# Patient Record
Sex: Male | Born: 1975 | Race: White | Hispanic: No | Marital: Married | Smoking: Never smoker
Health system: Southern US, Community
[De-identification: ages and names within clinical notes are randomized; demographics above are authoritative.]

## PROBLEM LIST (undated history)

## (undated) DIAGNOSIS — G4730 Sleep apnea, unspecified: Secondary | ICD-10-CM

## (undated) DIAGNOSIS — M722 Plantar fascial fibromatosis: Secondary | ICD-10-CM

## (undated) DIAGNOSIS — I10 Essential (primary) hypertension: Secondary | ICD-10-CM

## (undated) DIAGNOSIS — F329 Major depressive disorder, single episode, unspecified: Secondary | ICD-10-CM

## (undated) DIAGNOSIS — F419 Anxiety disorder, unspecified: Secondary | ICD-10-CM

## (undated) DIAGNOSIS — R110 Nausea: Secondary | ICD-10-CM

## (undated) DIAGNOSIS — M545 Low back pain, unspecified: Secondary | ICD-10-CM

## (undated) DIAGNOSIS — E119 Type 2 diabetes mellitus without complications: Secondary | ICD-10-CM

## (undated) DIAGNOSIS — N400 Enlarged prostate without lower urinary tract symptoms: Secondary | ICD-10-CM

## (undated) DIAGNOSIS — N3281 Overactive bladder: Secondary | ICD-10-CM

## (undated) DIAGNOSIS — R509 Fever, unspecified: Secondary | ICD-10-CM

## (undated) DIAGNOSIS — N201 Calculus of ureter: Secondary | ICD-10-CM

## (undated) DIAGNOSIS — F32A Depression, unspecified: Secondary | ICD-10-CM

## (undated) DIAGNOSIS — K602 Anal fissure, unspecified: Secondary | ICD-10-CM

## (undated) HISTORY — DX: Overactive bladder: N32.81

## (undated) HISTORY — DX: Low back pain: M54.5

## (undated) HISTORY — DX: Plantar fascial fibromatosis: M72.2

## (undated) HISTORY — DX: Depression, unspecified: F32.A

## (undated) HISTORY — DX: Anal fissure, unspecified: K60.2

## (undated) HISTORY — DX: Fever, unspecified: R50.9

## (undated) HISTORY — DX: Calculus of ureter: N20.1

## (undated) HISTORY — DX: Low back pain, unspecified: M54.50

## (undated) HISTORY — PX: ANAL SPHINCTEROTOMY: SHX1140

## (undated) HISTORY — DX: Morbid (severe) obesity due to excess calories: E66.01

## (undated) HISTORY — PX: ESOPHAGOGASTRODUODENOSCOPY: SHX1529

## (undated) HISTORY — DX: Nausea: R11.0

## (undated) HISTORY — DX: Major depressive disorder, single episode, unspecified: F32.9

## (undated) HISTORY — PX: APPENDECTOMY: SHX54

## (undated) HISTORY — DX: Anxiety disorder, unspecified: F41.9

## (undated) HISTORY — DX: Sleep apnea, unspecified: G47.30

---

## 2005-11-27 ENCOUNTER — Ambulatory Visit (HOSPITAL_COMMUNITY): Admission: RE | Admit: 2005-11-27 | Discharge: 2005-11-27 | Payer: Self-pay | Admitting: Surgery

## 2006-05-21 ENCOUNTER — Emergency Department (HOSPITAL_COMMUNITY): Admission: EM | Admit: 2006-05-21 | Discharge: 2006-05-21 | Payer: Self-pay | Admitting: Emergency Medicine

## 2006-05-24 ENCOUNTER — Emergency Department (HOSPITAL_COMMUNITY): Admission: EM | Admit: 2006-05-24 | Discharge: 2006-05-24 | Payer: Self-pay | Admitting: Emergency Medicine

## 2006-05-28 ENCOUNTER — Encounter (HOSPITAL_COMMUNITY): Admission: RE | Admit: 2006-05-28 | Discharge: 2006-07-17 | Payer: Self-pay | Admitting: Emergency Medicine

## 2007-01-01 ENCOUNTER — Ambulatory Visit (HOSPITAL_BASED_OUTPATIENT_CLINIC_OR_DEPARTMENT_OTHER): Admission: RE | Admit: 2007-01-01 | Discharge: 2007-01-01 | Payer: Self-pay | Admitting: Otolaryngology

## 2007-01-04 ENCOUNTER — Ambulatory Visit: Payer: Self-pay | Admitting: Internal Medicine

## 2007-01-25 ENCOUNTER — Ambulatory Visit: Payer: Self-pay | Admitting: Internal Medicine

## 2007-06-24 ENCOUNTER — Emergency Department (HOSPITAL_COMMUNITY): Admission: EM | Admit: 2007-06-24 | Discharge: 2007-06-24 | Payer: Self-pay | Admitting: Family Medicine

## 2007-06-30 ENCOUNTER — Emergency Department (HOSPITAL_COMMUNITY): Admission: EM | Admit: 2007-06-30 | Discharge: 2007-06-30 | Payer: Self-pay | Admitting: Emergency Medicine

## 2007-08-03 ENCOUNTER — Ambulatory Visit: Payer: Self-pay | Admitting: Licensed Clinical Social Worker

## 2007-08-03 ENCOUNTER — Ambulatory Visit: Payer: Self-pay | Admitting: Family Medicine

## 2007-08-03 DIAGNOSIS — M545 Low back pain, unspecified: Secondary | ICD-10-CM | POA: Insufficient documentation

## 2007-08-03 DIAGNOSIS — G4730 Sleep apnea, unspecified: Secondary | ICD-10-CM | POA: Insufficient documentation

## 2007-08-03 DIAGNOSIS — F411 Generalized anxiety disorder: Secondary | ICD-10-CM | POA: Insufficient documentation

## 2007-08-03 DIAGNOSIS — F329 Major depressive disorder, single episode, unspecified: Secondary | ICD-10-CM

## 2007-08-03 DIAGNOSIS — F3289 Other specified depressive episodes: Secondary | ICD-10-CM | POA: Insufficient documentation

## 2007-08-04 ENCOUNTER — Encounter: Payer: Self-pay | Admitting: Family Medicine

## 2007-08-04 LAB — CONVERTED CEMR LAB
ALT: 33 units/L (ref 0–53)
AST: 26 units/L (ref 0–37)
Albumin: 3.8 g/dL (ref 3.5–5.2)
Alkaline Phosphatase: 51 units/L (ref 39–117)
BUN: 16 mg/dL (ref 6–23)
Basophils Absolute: 0 10*3/uL (ref 0.0–0.1)
Basophils Relative: 0.3 % (ref 0.0–1.0)
Bilirubin, Direct: 0.2 mg/dL (ref 0.0–0.3)
CO2: 27 meq/L (ref 19–32)
Calcium: 9.5 mg/dL (ref 8.4–10.5)
Chloride: 106 meq/L (ref 96–112)
Cholesterol: 187 mg/dL (ref 0–200)
Creatinine, Ser: 0.8 mg/dL (ref 0.4–1.5)
Eosinophils Absolute: 0.1 10*3/uL (ref 0.0–0.6)
Eosinophils Relative: 1.8 % (ref 0.0–5.0)
GFR calc Af Amer: 147 mL/min
GFR calc non Af Amer: 121 mL/min
Glucose, Bld: 84 mg/dL (ref 70–99)
HCT: 42.5 % (ref 39.0–52.0)
HDL: 37.6 mg/dL — ABNORMAL LOW (ref >39.0)
Hemoglobin: 15 g/dL (ref 13.0–17.0)
LDL Cholesterol: 129 mg/dL — ABNORMAL HIGH (ref 0–99)
Lymphocytes Relative: 32.4 % (ref 12.0–46.0)
MCHC: 35.3 g/dL (ref 30.0–36.0)
MCV: 84.2 fL (ref 78.0–100.0)
Monocytes Absolute: 0.4 10*3/uL (ref 0.2–0.7)
Monocytes Relative: 4.9 % (ref 3.0–11.0)
Neutro Abs: 4.6 10*3/uL (ref 1.4–7.7)
Neutrophils Relative %: 60.6 % (ref 43.0–77.0)
Platelets: 267 10*3/uL (ref 150–400)
Potassium: 5.1 meq/L (ref 3.5–5.1)
RBC: 5.05 M/uL (ref 4.22–5.81)
RDW: 13.3 % (ref 11.5–14.6)
Sodium: 141 meq/L (ref 135–145)
TSH: 1.83 microintl units/mL (ref 0.35–5.50)
Total Bilirubin: 0.6 mg/dL (ref 0.3–1.2)
Total CHOL/HDL Ratio: 5
Total Protein: 7.1 g/dL (ref 6.0–8.3)
Triglycerides: 100 mg/dL (ref 0–149)
VLDL: 20 mg/dL (ref 0–40)
WBC: 7.6 10*3/uL (ref 4.5–10.5)

## 2007-08-11 ENCOUNTER — Ambulatory Visit: Payer: Self-pay | Admitting: Licensed Clinical Social Worker

## 2007-09-16 ENCOUNTER — Telehealth: Payer: Self-pay | Admitting: Family Medicine

## 2007-09-23 ENCOUNTER — Telehealth: Payer: Self-pay | Admitting: Family Medicine

## 2007-10-08 ENCOUNTER — Encounter: Payer: Self-pay | Admitting: Endocrinology

## 2007-10-08 ENCOUNTER — Encounter: Payer: Self-pay | Admitting: Family Medicine

## 2007-10-14 ENCOUNTER — Ambulatory Visit (HOSPITAL_COMMUNITY): Admission: RE | Admit: 2007-10-14 | Discharge: 2007-10-14 | Payer: Self-pay | Admitting: General Surgery

## 2007-10-19 ENCOUNTER — Encounter: Admission: RE | Admit: 2007-10-19 | Discharge: 2007-10-20 | Payer: Self-pay | Admitting: General Surgery

## 2007-12-15 ENCOUNTER — Ambulatory Visit: Payer: Self-pay | Admitting: Family Medicine

## 2007-12-15 DIAGNOSIS — N318 Other neuromuscular dysfunction of bladder: Secondary | ICD-10-CM | POA: Insufficient documentation

## 2007-12-15 LAB — CONVERTED CEMR LAB
Bilirubin Urine: NEGATIVE
Blood in Urine, dipstick: NEGATIVE
Glucose, Urine, Semiquant: NEGATIVE
Ketones, urine, test strip: NEGATIVE
Nitrite: NEGATIVE
Protein, U semiquant: NEGATIVE
Specific Gravity, Urine: 1.015
Urobilinogen, UA: 0.2
WBC Urine, dipstick: NEGATIVE
pH: 7

## 2007-12-30 ENCOUNTER — Telehealth: Payer: Self-pay | Admitting: Family Medicine

## 2008-03-11 ENCOUNTER — Telehealth: Payer: Self-pay | Admitting: Family Medicine

## 2008-03-17 ENCOUNTER — Ambulatory Visit: Payer: Self-pay | Admitting: Family Medicine

## 2008-03-23 ENCOUNTER — Telehealth: Payer: Self-pay | Admitting: Family Medicine

## 2008-03-23 ENCOUNTER — Encounter: Admission: RE | Admit: 2008-03-23 | Discharge: 2008-03-23 | Payer: Self-pay | Admitting: Family Medicine

## 2008-03-23 ENCOUNTER — Encounter: Admission: RE | Admit: 2008-03-23 | Discharge: 2008-03-23 | Payer: Self-pay | Admitting: General Surgery

## 2008-03-31 ENCOUNTER — Telehealth: Payer: Self-pay | Admitting: Family Medicine

## 2008-04-11 ENCOUNTER — Telehealth: Payer: Self-pay | Admitting: Family Medicine

## 2008-04-12 ENCOUNTER — Ambulatory Visit: Payer: Self-pay | Admitting: Family Medicine

## 2008-04-19 ENCOUNTER — Encounter: Admission: RE | Admit: 2008-04-19 | Discharge: 2008-07-18 | Payer: Self-pay | Admitting: Family Medicine

## 2008-06-06 ENCOUNTER — Ambulatory Visit (HOSPITAL_COMMUNITY): Admission: RE | Admit: 2008-06-06 | Discharge: 2008-06-07 | Payer: Self-pay | Admitting: General Surgery

## 2008-06-06 HISTORY — PX: OTHER SURGICAL HISTORY: SHX169

## 2009-03-08 ENCOUNTER — Ambulatory Visit (HOSPITAL_COMMUNITY): Admission: RE | Admit: 2009-03-08 | Discharge: 2009-03-08 | Payer: Self-pay | Admitting: Urology

## 2009-05-15 ENCOUNTER — Ambulatory Visit: Payer: Self-pay | Admitting: Family Medicine

## 2009-05-15 DIAGNOSIS — K645 Perianal venous thrombosis: Secondary | ICD-10-CM | POA: Insufficient documentation

## 2009-06-06 ENCOUNTER — Ambulatory Visit (HOSPITAL_COMMUNITY): Admission: RE | Admit: 2009-06-06 | Discharge: 2009-06-06 | Payer: Self-pay | Admitting: General Surgery

## 2009-06-29 ENCOUNTER — Telehealth: Payer: Self-pay | Admitting: Family Medicine

## 2009-07-05 ENCOUNTER — Emergency Department (HOSPITAL_COMMUNITY): Admission: EM | Admit: 2009-07-05 | Discharge: 2009-07-06 | Payer: Self-pay | Admitting: Emergency Medicine

## 2009-07-06 ENCOUNTER — Ambulatory Visit: Payer: Self-pay | Admitting: Psychiatry

## 2009-07-06 ENCOUNTER — Inpatient Hospital Stay (HOSPITAL_COMMUNITY): Admission: AD | Admit: 2009-07-06 | Discharge: 2009-07-09 | Payer: Self-pay | Admitting: Psychiatry

## 2009-07-12 ENCOUNTER — Telehealth: Payer: Self-pay | Admitting: Family Medicine

## 2009-07-16 ENCOUNTER — Emergency Department (HOSPITAL_COMMUNITY): Admission: EM | Admit: 2009-07-16 | Discharge: 2009-07-16 | Payer: Self-pay | Admitting: Emergency Medicine

## 2009-07-18 ENCOUNTER — Encounter: Payer: Self-pay | Admitting: Internal Medicine

## 2009-07-18 ENCOUNTER — Ambulatory Visit: Payer: Self-pay | Admitting: Gastroenterology

## 2009-07-19 ENCOUNTER — Ambulatory Visit: Payer: Self-pay | Admitting: Endocrinology

## 2009-07-19 DIAGNOSIS — R519 Headache, unspecified: Secondary | ICD-10-CM | POA: Insufficient documentation

## 2009-07-19 DIAGNOSIS — R51 Headache: Secondary | ICD-10-CM | POA: Insufficient documentation

## 2009-07-21 ENCOUNTER — Encounter: Payer: Self-pay | Admitting: Endocrinology

## 2009-07-25 ENCOUNTER — Ambulatory Visit: Payer: Self-pay | Admitting: Family Medicine

## 2009-07-25 DIAGNOSIS — K219 Gastro-esophageal reflux disease without esophagitis: Secondary | ICD-10-CM | POA: Insufficient documentation

## 2009-07-26 ENCOUNTER — Telehealth: Payer: Self-pay | Admitting: Family Medicine

## 2009-07-26 LAB — CONVERTED CEMR LAB
Catecholamines Tot(E+NE) 24 Hr U: 0.128 mg/24hr — ABNORMAL HIGH
Dopamine 24 Hr Urine: 585 mcg/24hr — ABNORMAL HIGH (ref <500)
Epinephrine 24 Hr Urine: 5 mcg/24hr (ref <20)
Metaneph Total, Ur: 1004 ug/24hr — ABNORMAL HIGH (ref 115–695)
Metanephrines, Ur: 109 (ref 36–190)
Norepinephrine 24 Hr Urine: 122 mcg/24hr — ABNORMAL HIGH (ref <80)
Normetanephrine, 24H Ur: 895 — ABNORMAL HIGH (ref 35–482)

## 2009-07-31 ENCOUNTER — Telehealth: Payer: Self-pay | Admitting: Internal Medicine

## 2009-07-31 ENCOUNTER — Ambulatory Visit: Payer: Self-pay | Admitting: Gastroenterology

## 2009-07-31 DIAGNOSIS — K602 Anal fissure, unspecified: Secondary | ICD-10-CM | POA: Insufficient documentation

## 2009-07-31 DIAGNOSIS — R112 Nausea with vomiting, unspecified: Secondary | ICD-10-CM | POA: Insufficient documentation

## 2009-07-31 DIAGNOSIS — R1310 Dysphagia, unspecified: Secondary | ICD-10-CM | POA: Insufficient documentation

## 2009-07-31 DIAGNOSIS — R1110 Vomiting, unspecified: Secondary | ICD-10-CM | POA: Insufficient documentation

## 2009-07-31 DIAGNOSIS — R197 Diarrhea, unspecified: Secondary | ICD-10-CM | POA: Insufficient documentation

## 2009-07-31 DIAGNOSIS — R1319 Other dysphagia: Secondary | ICD-10-CM | POA: Insufficient documentation

## 2009-07-31 DIAGNOSIS — K920 Hematemesis: Secondary | ICD-10-CM | POA: Insufficient documentation

## 2009-08-01 ENCOUNTER — Encounter: Payer: Self-pay | Admitting: Physician Assistant

## 2009-08-01 LAB — CONVERTED CEMR LAB
ALT: 37 units/L (ref 0–53)
AST: 30 units/L (ref 0–37)
Albumin: 3.4 g/dL — ABNORMAL LOW (ref 3.5–5.2)
Alkaline Phosphatase: 39 units/L (ref 39–117)
BUN: 13 mg/dL (ref 6–23)
Basophils Absolute: 0.1 10*3/uL (ref 0.0–0.1)
Basophils Relative: 1 % (ref 0.0–3.0)
CO2: 29 meq/L (ref 19–32)
Calcium: 8.9 mg/dL (ref 8.4–10.5)
Chloride: 108 meq/L (ref 96–112)
Creatinine, Ser: 0.8 mg/dL (ref 0.4–1.5)
Eosinophils Absolute: 0.3 10*3/uL (ref 0.0–0.7)
Eosinophils Relative: 4.8 % (ref 0.0–5.0)
GFR calc non Af Amer: 119.41 mL/min (ref >60)
Glucose, Bld: 95 mg/dL (ref 70–99)
HCT: 38.1 % — ABNORMAL LOW (ref 39.0–52.0)
Hemoglobin: 13.2 g/dL (ref 13.0–17.0)
Lymphocytes Relative: 45 % (ref 12.0–46.0)
Lymphs Abs: 3 10*3/uL (ref 0.7–4.0)
MCHC: 34.7 g/dL (ref 30.0–36.0)
MCV: 86.2 fL (ref 78.0–100.0)
Monocytes Absolute: 0.4 10*3/uL (ref 0.1–1.0)
Monocytes Relative: 5.6 % (ref 3.0–12.0)
Neutro Abs: 3 10*3/uL (ref 1.4–7.7)
Neutrophils Relative %: 43.6 % (ref 43.0–77.0)
Platelets: 225 10*3/uL (ref 150.0–400.0)
Potassium: 4.4 meq/L (ref 3.5–5.1)
RBC: 4.41 M/uL (ref 4.22–5.81)
RDW: 14.1 % (ref 11.5–14.6)
Sodium: 141 meq/L (ref 135–145)
Total Bilirubin: 0.7 mg/dL (ref 0.3–1.2)
Total Protein: 6.8 g/dL (ref 6.0–8.3)
WBC: 6.8 10*3/uL (ref 4.5–10.5)

## 2009-08-03 ENCOUNTER — Ambulatory Visit (HOSPITAL_COMMUNITY): Admission: RE | Admit: 2009-08-03 | Discharge: 2009-08-03 | Payer: Self-pay | Admitting: Gastroenterology

## 2009-08-08 ENCOUNTER — Ambulatory Visit: Payer: Self-pay | Admitting: Internal Medicine

## 2009-08-08 ENCOUNTER — Ambulatory Visit (HOSPITAL_COMMUNITY): Admission: RE | Admit: 2009-08-08 | Discharge: 2009-08-08 | Payer: Self-pay | Admitting: Internal Medicine

## 2009-10-17 ENCOUNTER — Ambulatory Visit: Payer: Self-pay | Admitting: Psychiatry

## 2009-10-17 ENCOUNTER — Inpatient Hospital Stay (HOSPITAL_COMMUNITY): Admission: EM | Admit: 2009-10-17 | Discharge: 2009-10-19 | Payer: Self-pay | Admitting: Psychiatry

## 2009-10-17 ENCOUNTER — Emergency Department (HOSPITAL_COMMUNITY): Admission: EM | Admit: 2009-10-17 | Discharge: 2009-10-17 | Payer: Self-pay | Admitting: Emergency Medicine

## 2009-11-17 ENCOUNTER — Ambulatory Visit (HOSPITAL_COMMUNITY): Admission: RE | Admit: 2009-11-17 | Discharge: 2009-11-17 | Payer: Self-pay | Admitting: General Surgery

## 2009-12-04 ENCOUNTER — Emergency Department (HOSPITAL_COMMUNITY): Admission: EM | Admit: 2009-12-04 | Discharge: 2009-12-04 | Payer: Self-pay | Admitting: Emergency Medicine

## 2009-12-07 ENCOUNTER — Telehealth: Payer: Self-pay | Admitting: Family Medicine

## 2009-12-14 ENCOUNTER — Telehealth: Payer: Self-pay | Admitting: Family Medicine

## 2010-04-09 ENCOUNTER — Telehealth: Payer: Self-pay | Admitting: Family Medicine

## 2010-05-15 ENCOUNTER — Ambulatory Visit: Payer: Self-pay | Admitting: Family Medicine

## 2010-05-15 DIAGNOSIS — R1011 Right upper quadrant pain: Secondary | ICD-10-CM | POA: Insufficient documentation

## 2010-05-15 LAB — CONVERTED CEMR LAB
Bilirubin Urine: NEGATIVE
Blood in Urine, dipstick: NEGATIVE
Glucose, Urine, Semiquant: NEGATIVE
Ketones, urine, test strip: NEGATIVE
Nitrite: NEGATIVE
Protein, U semiquant: NEGATIVE
Specific Gravity, Urine: 1.025
Urobilinogen, UA: 0.2
WBC Urine, dipstick: NEGATIVE
pH: 5.5

## 2010-05-17 ENCOUNTER — Encounter: Admission: RE | Admit: 2010-05-17 | Discharge: 2010-05-17 | Payer: Self-pay | Admitting: Family Medicine

## 2010-05-17 LAB — CONVERTED CEMR LAB
ALT: 25 units/L (ref 0–53)
AST: 27 units/L (ref 0–37)
Albumin: 3.9 g/dL (ref 3.5–5.2)
Alkaline Phosphatase: 50 units/L (ref 39–117)
Amylase: 67 units/L (ref 27–131)
BUN: 18 mg/dL (ref 6–23)
Basophils Absolute: 0 10*3/uL (ref 0.0–0.1)
Basophils Relative: 0.4 % (ref 0.0–3.0)
Bilirubin, Direct: 0.1 mg/dL (ref 0.0–0.3)
CO2: 25 meq/L (ref 19–32)
Calcium: 9.4 mg/dL (ref 8.4–10.5)
Chloride: 106 meq/L (ref 96–112)
Creatinine, Ser: 0.8 mg/dL (ref 0.4–1.5)
Eosinophils Absolute: 0.3 10*3/uL (ref 0.0–0.7)
Eosinophils Relative: 2.3 % (ref 0.0–5.0)
GFR calc non Af Amer: 117.12 mL/min (ref >60)
Glucose, Bld: 122 mg/dL — ABNORMAL HIGH (ref 70–99)
HCT: 40.3 % (ref 39.0–52.0)
Hemoglobin: 13.9 g/dL (ref 13.0–17.0)
Lipase: 9 units/L — ABNORMAL LOW (ref 11.0–59.0)
Lymphocytes Relative: 29 % (ref 12.0–46.0)
Lymphs Abs: 3.5 10*3/uL (ref 0.7–4.0)
MCHC: 34.5 g/dL (ref 30.0–36.0)
MCV: 85.8 fL (ref 78.0–100.0)
Monocytes Absolute: 0.5 10*3/uL (ref 0.1–1.0)
Monocytes Relative: 4.3 % (ref 3.0–12.0)
Neutro Abs: 7.6 10*3/uL (ref 1.4–7.7)
Neutrophils Relative %: 64 % (ref 43.0–77.0)
Platelets: 314 10*3/uL (ref 150.0–400.0)
Potassium: 4.3 meq/L (ref 3.5–5.1)
RBC: 4.71 M/uL (ref 4.22–5.81)
RDW: 14.1 % (ref 11.5–14.6)
Sodium: 140 meq/L (ref 135–145)
Total Bilirubin: 0.3 mg/dL (ref 0.3–1.2)
Total Protein: 7.9 g/dL (ref 6.0–8.3)
WBC: 11.9 10*3/uL — ABNORMAL HIGH (ref 4.5–10.5)

## 2010-07-02 ENCOUNTER — Telehealth: Payer: Self-pay | Admitting: Family Medicine

## 2010-07-18 ENCOUNTER — Emergency Department (HOSPITAL_COMMUNITY): Admission: EM | Admit: 2010-07-18 | Discharge: 2010-07-18 | Payer: Self-pay | Admitting: Family Medicine

## 2010-10-09 ENCOUNTER — Emergency Department (HOSPITAL_COMMUNITY)
Admission: EM | Admit: 2010-10-09 | Discharge: 2010-10-09 | Payer: Self-pay | Source: Home / Self Care | Admitting: Oncology

## 2010-11-21 ENCOUNTER — Ambulatory Visit (HOSPITAL_COMMUNITY)
Admission: RE | Admit: 2010-11-21 | Discharge: 2010-11-21 | Payer: Self-pay | Source: Home / Self Care | Attending: Gastroenterology | Admitting: Gastroenterology

## 2010-11-25 ENCOUNTER — Encounter: Payer: Self-pay | Admitting: Gastroenterology

## 2010-11-25 ENCOUNTER — Encounter: Payer: Self-pay | Admitting: General Surgery

## 2010-12-04 NOTE — Progress Notes (Signed)
Summary: refill hydrocodone  Phone Note Refill Request Message from:  Pharmacy on December 07, 2009 9:57 AM  Refills Requested: Medication #1:  VICODIN ES 7.5-750 MG  TABS one by mouth q 4-6 hrs as needed for pain. [facility]   Method Requested: Electronic Initial call taken by: Alfred Levins, CMA,  December 07, 2009 9:57 AM  Follow-up for Phone Call        call in #60 with 5 rf Follow-up by: Nelwyn Salisbury MD,  December 07, 2009 12:06 PM  Additional Follow-up for Phone Call Additional follow up Details #1::        Rx called to pharmacy Additional Follow-up by: Alfred Levins, CMA,  December 07, 2009 12:18 PM    Prescriptions: VICODIN ES 7.5-750 MG  TABS (HYDROCODONE-ACETAMINOPHEN) one by mouth q 4-6 hrs as needed for pain.  #60 x 2   Entered by:   Alfred Levins, CMA   Authorized by:   Nelwyn Salisbury MD   Signed by:   Alfred Levins, CMA on 12/07/2009   Method used:   Telephoned to ...       [facility]. [phone]* (retail)       [phone]       [place], [place]  [address]       Ph: [phone]       Fax: [phone]   RxID:   [id number]

## 2010-12-04 NOTE — Assessment & Plan Note (Signed)
Summary: headaches/dm   Vital Signs:  Patient profile:   35 year old male Weight:      430 pounds BMI:     53.94 Temp:     99.1 degrees F oral BP sitting:   120 / 80  (left arm) Cuff size:   large  Vitals Entered By: Raechel Ache, RN (May 15, 2010 4:11 PM) CC: C/o nausea, vomiting, diarrhea, upper abd pain , low-grade fever and headaches x several weeks.   History of Present Illness: For the past 10 days he has had intermittent RUQ adominal pains, nausea and vomitting, loose stools, and a low grade fever. Appetite is down a bit. The pains usually start 15-20 minutes after eating a meal, then they improve in between meals. he had an ivolved workup last fall per Dr. Leone Payor for nausea and diarrhea with normal labs and a normal EGD, and it was felt these symptoms were from the Abilify he was taking then. After that the Abilify was stopped, and he was switched to Geodon and lithium, and he has had no GI problems until 10 days ago. No urinary symptoms.   Allergies: No Known Drug Allergies  Past History:  Past Medical History: Anxiety Depression (per Dr. Marshall Cork WITH PSYCHOTIC FEATURES 9/10 /BIPOLAR Low back pain  sleep apnea (Dr. Jenne Pane) CPAP overactive bladder morbid obesity plantar fasciitis Anal Fissure HX URETEROLITHIASIS chronic nausea, normal workup per Dr. Leone Payor  Past Surgical History: Appendectomy 1998 sphincterotomy for anal fissure (Dr. Pricilla Holm Lap Band 06/2008 Dr. Johna Sheriff EGD on 08-08-09 per Dr. Leone Payor, normal  Review of Systems  The patient denies anorexia, weight loss, weight gain, vision loss, decreased hearing, hoarseness, chest pain, syncope, dyspnea on exertion, peripheral edema, prolonged cough, headaches, hemoptysis, melena, hematochezia, severe indigestion/heartburn, hematuria, incontinence, genital sores, muscle weakness, suspicious skin lesions, transient blindness, difficulty walking, depression, unusual weight change, abnormal bleeding,  enlarged lymph nodes, angioedema, breast masses, and testicular masses.    Physical Exam  General:  Well-developed,well-nourished,in no acute distress; alert,appropriate and cooperative throughout examination Neck:  No deformities, masses, or tenderness noted. Lungs:  Normal respiratory effort, chest expands symmetrically. Lungs are clear to auscultation, no crackles or wheezes. Heart:  Normal rate and regular rhythm. S1 and S2 normal without gallop, murmur, click, rub or other extra sounds. Abdomen:  soft, normal bowel sounds, no distention, no masses, no guarding, no rigidity, no rebound tenderness, no abdominal hernia, no inguinal hernia, no hepatomegaly, and no splenomegaly.  Mildly tender in the RUQ   Impression & Recommendations:  Problem # 1:  ABDOMINAL PAIN RIGHT UPPER QUADRANT (ICD-789.01)  Orders: UA Dipstick w/o Micro (automated)  (81003) Venipuncture (16109) TLB-BMP (Basic Metabolic Panel-BMET) (80048-METABOL) TLB-CBC Platelet - w/Differential (85025-CBCD) TLB-Hepatic/Liver Function Pnl (80076-HEPATIC) TLB-Amylase (82150-AMYL) TLB-Lipase (83690-LIPASE) Radiology Referral (Radiology)  Complete Medication List: 1)  Effexor Xr 150 Mg Xr24h-cap (Venlafaxine hcl) .... Take two by mouth once daily 2)  Lorazepam 1 Mg Tabs (Lorazepam) .... Take 2 by mouth every morning and three by mouth every night 3)  Vicodin Es 7.5-750 Mg Tabs (Hydrocodone-acetaminophen) .... One by mouth q 4-6 hrs as needed for pain. 4)  Abilify 20 Mg Tabs (Aripiprazole) .... Take one by mouth once daily 5)  Multivitamins Tabs (Multiple vitamin) .... Take one by mouth once daily 6)  Aleve 220 Mg Tabs (Naproxen sodium) .... Take one by mouth at bedtime 7)  Tylenol 325 Mg Tabs (Acetaminophen) .... Take one  by mouth as needed 8)  Ambien Cr 12.5 Mg Cr-tabs (Zolpidem tartrate) .Marland KitchenMarland KitchenMarland Kitchen  1 at bedtime as needed 9)  Geodon 80 Mg Caps (Ziprasidone hcl) .Marland Kitchen.. 1 two times a day 10)  Lithium Carbonate 150 Mg Caps (Lithium  carbonate) .Marland Kitchen.. 1 once daily  Patient Instructions: 1)  This is suggestive of gall bladder disease, so we will get labs today and set him up for an abdominal US soon. Avoid fatty foods. Drink fluids. He has Phenergan at home he can use as needed.   Laboratory Results   Urine Tests    Routine Urinalysis   Color: yellow Appearance: Clear Glucose: negative   (Normal Range: Negative) Bilirubin: negative   (Normal Range: Negative) Ketone: negative   (Normal Range: Negative) Spec. Gravity: 1.025   (Normal Range: 1.003-1.035) Blood: negative   (Normal Range: Negative) pH: 5.5   (Normal Range: 5.0-8.0) Protein: negative   (Normal Range: Negative) Urobilinogen: 0.2   (Normal Range: 0-1) Nitrite: negative   (Normal Range: Negative) Leukocyte Esterace: negative   (Normal Range: Negative)    Comments: Rita Ohara  May 15, 2010 5:28 PM
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
## 2010-12-04 NOTE — Progress Notes (Signed)
Summary: FYI  Phone Note Call from Patient   Caller: Patient Call For: Nelwyn Salisbury MD Summary of Call: [place]  [place] and advised to go to URGENT care.  He states he left his meds at home? [phone] Initial call taken by: Lynann Beaver CMA,  April 09, 2010 8:58 AM  Follow-up for Phone Call        there are refill on his rx  can contact his pharmacy about this . Will not call in a new rx.  Follow-up by: Madelin Headings MD,  April 09, 2010 1:11 PM

## 2011-01-07 ENCOUNTER — Ambulatory Visit (INDEPENDENT_AMBULATORY_CARE_PROVIDER_SITE_OTHER): Payer: BC Managed Care – PPO | Admitting: Family Medicine

## 2011-01-07 ENCOUNTER — Encounter: Payer: Self-pay | Admitting: Family Medicine

## 2011-01-07 VITALS — BP 120/78 | Temp 98.5°F | Ht 76.0 in | Wt >= 6400 oz

## 2011-01-07 DIAGNOSIS — M545 Low back pain, unspecified: Secondary | ICD-10-CM

## 2011-01-07 DIAGNOSIS — S0083XA Contusion of other part of head, initial encounter: Secondary | ICD-10-CM

## 2011-01-07 DIAGNOSIS — S0003XA Contusion of scalp, initial encounter: Secondary | ICD-10-CM

## 2011-01-07 DIAGNOSIS — S1093XA Contusion of unspecified part of neck, initial encounter: Secondary | ICD-10-CM

## 2011-01-07 MED ORDER — HYDROCODONE-IBUPROFEN 7.5-200 MG PO TABS: 1.0000 | ORAL_TABLET | Freq: Four times a day (QID) | ORAL | Status: AC | PRN

## 2011-01-07 NOTE — Progress Notes (Signed)
  Subjective:    Patient ID: Angel Pope, male    DOB: 1976-07-01, 35 y.o.   MRN: [id number]  HPI Here asking for pain meds. He has a long hx of low back pain that flares up from time to time. On 01-04-11 he fell down some steps at his home, and he landed on the bottom with his left face striking the floor. He has had pain in the left jaw and the lower back since then. Using ice on the jaw. It hurts to chew. Tried Aleeve and Ibuprofen with no results. There was no LOC, and he has had no neurologic signs since then.    Review of Systems  Constitutional: Negative.   HENT: Negative for facial swelling, neck pain, neck stiffness and dental problem.   Respiratory: Negative.   Cardiovascular: Negative.   Musculoskeletal: Positive for back pain.       Objective:   Physical Exam  Constitutional: He appears well-developed and well-nourished.  HENT:  Head: Normocephalic and atraumatic.       Tender over the left TMJ and mandibular angle. No crepitus. Full ROM of the jaw.   Neck: Normal range of motion. Neck supple.  Musculoskeletal: Normal range of motion. He exhibits no tenderness.          Assessment & Plan:  Rest, ice to the jaw.

## 2011-01-08 ENCOUNTER — Encounter (INDEPENDENT_AMBULATORY_CARE_PROVIDER_SITE_OTHER): Payer: Self-pay | Admitting: *Deleted

## 2011-01-08 DIAGNOSIS — R509 Fever, unspecified: Secondary | ICD-10-CM | POA: Insufficient documentation

## 2011-01-08 DIAGNOSIS — N411 Chronic prostatitis: Secondary | ICD-10-CM | POA: Insufficient documentation

## 2011-01-11 ENCOUNTER — Other Ambulatory Visit: Payer: Self-pay | Admitting: Infectious Diseases

## 2011-01-11 ENCOUNTER — Ambulatory Visit (INDEPENDENT_AMBULATORY_CARE_PROVIDER_SITE_OTHER): Payer: BC Managed Care – PPO | Admitting: Infectious Diseases

## 2011-01-11 ENCOUNTER — Encounter: Payer: Self-pay | Admitting: Infectious Diseases

## 2011-01-11 DIAGNOSIS — R509 Fever, unspecified: Secondary | ICD-10-CM

## 2011-01-11 LAB — CBC WITH DIFFERENTIAL/PLATELET
Basophils Absolute: 0 10*3/uL (ref 0.0–0.1)
Basophils Relative: 0 % (ref 0–1)
Lymphocytes Relative: 40 % (ref 12–46)
MCHC: 33 g/dL (ref 30.0–36.0)
Monocytes Absolute: 0.4 10*3/uL (ref 0.1–1.0)
Neutro Abs: 4.2 10*3/uL (ref 1.7–7.7)
Neutrophils Relative %: 51 % (ref 43–77)
Platelets: 317 10*3/uL (ref 150–400)
RDW: 14.1 % (ref 11.5–15.5)
WBC: 8.1 10*3/uL (ref 4.0–10.5)

## 2011-01-11 LAB — URINALYSIS, ROUTINE W REFLEX MICROSCOPIC
Glucose, UA: NEGATIVE mg/dL
Hgb urine dipstick: NEGATIVE
Protein, ur: NEGATIVE mg/dL
pH: 6 (ref 5.0–8.0)

## 2011-01-11 LAB — AMYLASE: Amylase: 31 U/L (ref 0–105)

## 2011-01-12 LAB — COMPLETE METABOLIC PANEL WITH GFR
Albumin: 4.1 g/dL (ref 3.5–5.2)
Alkaline Phosphatase: 47 U/L (ref 39–117)
BUN: 16 mg/dL (ref 6–23)
CO2: 22 mEq/L (ref 19–32)
Calcium: 9.4 mg/dL (ref 8.4–10.5)
GFR, Est African American: >60 mL/min (ref >60)
GFR, Est Non African American: >60 mL/min (ref >60)
Glucose, Bld: 93 mg/dL (ref 70–99)
Potassium: 4.8 mEq/L (ref 3.5–5.3)
Sodium: 137 mEq/L (ref 135–145)
Total Protein: 7.2 g/dL (ref 6.0–8.3)

## 2011-01-12 LAB — HIV ANTIBODY (ROUTINE TESTING W REFLEX): HIV: NONREACTIVE

## 2011-01-14 ENCOUNTER — Encounter: Payer: Self-pay | Admitting: Infectious Diseases

## 2011-01-15 NOTE — Miscellaneous (Signed)
Summary: problems, allergy, med  Clinical Lists Changes  Problems: Added new problem of FEVER UNSPECIFIED (ICD-780.60) Added new problem of CHRONIC PROSTATITIS (ICD-601.1) Medications: Changed medication from LITHIUM CARBONATE 150 MG CAPS (LITHIUM CARBONATE) 1 once daily to LITHIUM CARBONATE 450 MG CR-TABS (LITHIUM CARBONATE) Take 1 tablet by mouth once a day Removed medication of ABILIFY 20 MG TABS (ARIPIPRAZOLE) take one by mouth once daily Added new medication of DOXYCYCLINE HYCLATE 100 MG TABS (DOXYCYCLINE HYCLATE) Take 1 tablet by mouth two times a day Added new medication of HYDRALAZINE HCL 100 MG TAB (HYDRALAZINE HCL) Take 1 tablet by mouth twice a day Added new medication of CIPROFLOXACIN HCL 500 MG TABS (CIPROFLOXACIN HCL) take one tablet every twelve hours Added new medication of OXYCODONE HCL 10 MG TABS (OXYCODONE HCL) take one to two tablets every four hours as needed for pain Allergies: Added new allergy or adverse reaction of SEPTRA Added new allergy or adverse reaction of * ACETAMINOPHEN Observations: Added new observation of NKA: F (01/08/2011 10:18)

## 2011-01-15 NOTE — Assessment & Plan Note (Signed)
Summary: New pt Perst fevers x 2-3 wks/Ashlyn Brunning PA/recordstorci...   Vital Signs:  Patient profile:   35 year old male Height:      75 inches (190.50 cm) Weight:      444.0 pounds (201.82 kg) BMI:     55.70 Temp:     98.4 degrees F (36.89 degrees C) oral Pulse rate:   97 / minute BP sitting:   131 / 82  (right arm)  Vitals Entered By: Wendall Mola CMA Duncan Dull) (January 11, 2011 10:58 AM) CC: new pt. recurring fevers and prostitis Is Patient Diabetic? No Pain Assessment Patient in pain? yes     Location: prostate Intensity: 9 Type: burning, aching Onset of pain  Constant Nutritional Status BMI of > 30 = obese Nutritional Status Detail appetite "reduced"  Have you ever been in a relationship where you felt threatened, hurt or afraid?No   Does patient need assistance? Functional Status Self care Ambulation Normal Comments pt. has finished antibiotics, but is still having symptoms   Primary Provider:  Gershon Crane, MD  CC:  new pt. recurring fevers and prostitis.  History of Present Illness: 35 yo M with hx of morbid obesity, who states he has had fevers for last month as well as worsening chronic prostatis. He has prev Cx  E coli (pan sens). States pain prostatitis is so severe that it keeps him awake at night. Had hard time starting his urine stream but no blood/burining/cloudiness. No flank pain.  States his fever has gotten to 102. usually 100-100.5. no loose BM. Has had several rounds of antibiotics for his prostatitis (last augmentin) without improvement.   Preventive Screening-Counseling & Management  Alcohol-Tobacco     Alcohol drinks/day: 0     Smoking Status: never  Caffeine-Diet-Exercise     Caffeine use/day: coffee 1 per day     Does Patient Exercise: yes     Type of exercise: walking     Exercise (avg: min/session): <30     Times/week: 3  Safety-Violence-Falls     Seat Belt Use: yes  Current Medications (verified): 1)  Effexor Xr 150 Mg  Xr24h-Cap (Venlafaxine Hcl) .... Take Two By Mouth Once Daily 2)  Lorazepam 1 Mg Tabs (Lorazepam) .... Take 2 By Mouth Every Morning and Three By Mouth Every Night 3)  Vicodin Es 7.5-750 Mg  Tabs (Hydrocodone-Acetaminophen) .... One By Mouth Q 4-6 Hrs As Needed For Pain. 4)  Multivitamins  Tabs (Multiple Vitamin) .... Take One By Mouth Once Daily 5)  Aleve 220 Mg Tabs (Naproxen Sodium) .... Take One By Mouth At Bedtime 6)  Ambien Cr 12.5 Mg Cr-Tabs (Zolpidem Tartrate) .Marland Kitchen.. 1 At Bedtime As Needed 7)  Geodon 80 Mg Caps (Ziprasidone Hcl) .Marland Kitchen.. 1 Two Times A Day 8)  Lithium Carbonate 450 Mg Cr-Tabs (Lithium Carbonate) .... Take 1 Tablet By Mouth Once A Day 9)  Hydralazine Hcl 100 Mg Tab (Hydralazine Hcl) .... Take 1 Tablet By Mouth Twice A Day 10)  Oxycodone Hcl 10 Mg Tabs (Oxycodone Hcl) .... Take One To Two Tablets Every Four Hours As Needed For Pain  Allergies: 1)  ! Septra 2)  ! * Acetaminophen 3)  ! Cipro  Social History: Occupation: in grad school to become a Runner, broadcasting/film/video Married Never Smoked Alcohol use-no Daily Caffeine Use  Review of Systems       see HPI. occas pain in his L TMJ, no oter joint swelling or erythema. has OSA/uses CPAP. occas wakes up with n/v.  Physical Exam  General:  well-developed, well-nourished, well-hydrated, and overweight-appearing.   Eyes:  pupils equal, pupils round, and pupils reactive to light.   Mouth:  pharynx pink and moist and no exudates.   Neck:  no masses.   Lungs:  normal respiratory effort and normal breath sounds.   Heart:  normal rate, regular rhythm, and no murmur.   Abdomen:  soft, non-tender, and normal bowel sounds.   Extremities:  trace left pedal edema and trace right pedal edema. no calf tenderness or cordis.     Impression & Recommendations:  Problem # 1:  FEVER UNSPECIFIED (ICD-780.60)  the etiology of his syndrome is ? Several things come to mind- prostatic abscess, and GERD with persistent/repeated micro-aspirations. An ADR  to geodon or effexor is possible but he has been on these for > 6 months . We will check ua, ucx, bcx, Had CT of prostate at his urologist (Dr Vernie Ammons) 2 weeks previous which he states showed no infection.  Will get a copy of this to confirm this.  will start him on prilosec 40mg  and have him return to clinic in 2-3 weeks.   He asks for a refill of his oxycodone to get him through the weekend. he is given a  refill of 60 and he ackowledges that this is a one time rx.  The following medications were removed from the medication list:    Doxycycline Hyclate 100 Mg Tabs (Doxycycline hyclate) .Marland Kitchen... Take 1 tablet by mouth two times a day    Ciprofloxacin Hcl 500 Mg Tabs (Ciprofloxacin hcl) .Marland Kitchen... Take one tablet every twelve hours  Orders: T-Comprehensive Metabolic Panel [id number]) T-CBC w/Diff ([id number]) T-HIV Antibody  (Reflex) [id number]) T-Amylase (57846-96295) T-TSH [id number]) T-Urinalysis (02725-36644) T-Culture, Blood Routine ([id number]) T-Culture, Urine ([id number]) Consultation Level IV (51884)  Medications Added to Medication List This Visit: 1)  Vicoprofen 7.5-200 Mg Tabs (Hydrocodone-ibuprofen) .... As directed 2)  Prilosec 40 Mg Cpdr (Omeprazole) .... Take 1 tablet by mouth once a day Prescriptions: PRILOSEC 40 MG CPDR (OMEPRAZOLE) Take 1 tablet by mouth once a day  #30 x 3   Entered and Authorized by:   Johny Sax MD   Signed by:   Johny Sax MD on 01/11/2011   Method used:   Print then Give to Patient   RxID:   [id number]   Handout requested. OXYCODONE HCL 10 MG TABS (OXYCODONE HCL) take one to two tablets every four hours as needed for pain  #60 x 0   Entered and Authorized by:   Johny Sax MD   Signed by:   Johny Sax MD on 01/11/2011   Method used:   Print then Give to Patient   RxID:   [id number]   Handout requested.    Orders Added: 1)  T-Comprehensive Metabolic Panel [[id number]] 2)  T-CBC w/Diff [[id number]] 3)   T-HIV Antibody  (Reflex) [[id number]] 4)  T-Amylase [82150-23210] 5)  T-TSH [[id number]] 6)  T-Urinalysis [81003-65000] 7)  T-Culture, Blood Routine [[id number]] 8)  T-Culture, Urine [[id number]] 9)  Consultation Level IV [99371]

## 2011-01-20 LAB — POCT I-STAT, CHEM 8
BUN: 10 mg/dL (ref 6–23)
Calcium, Ion: 0.95 mmol/L — ABNORMAL LOW (ref 1.12–1.32)
Chloride: 109 mEq/L (ref 96–112)
Glucose, Bld: 88 mg/dL (ref 70–99)

## 2011-01-31 IMAGING — CR DG CHEST 2V
2 series · 2 of 2 positions shown · non-contrast
Comparison: 10/14/07

CLINICAL DATA: Chest pain

CHEST - 2 VIEW

[w chest pa *]
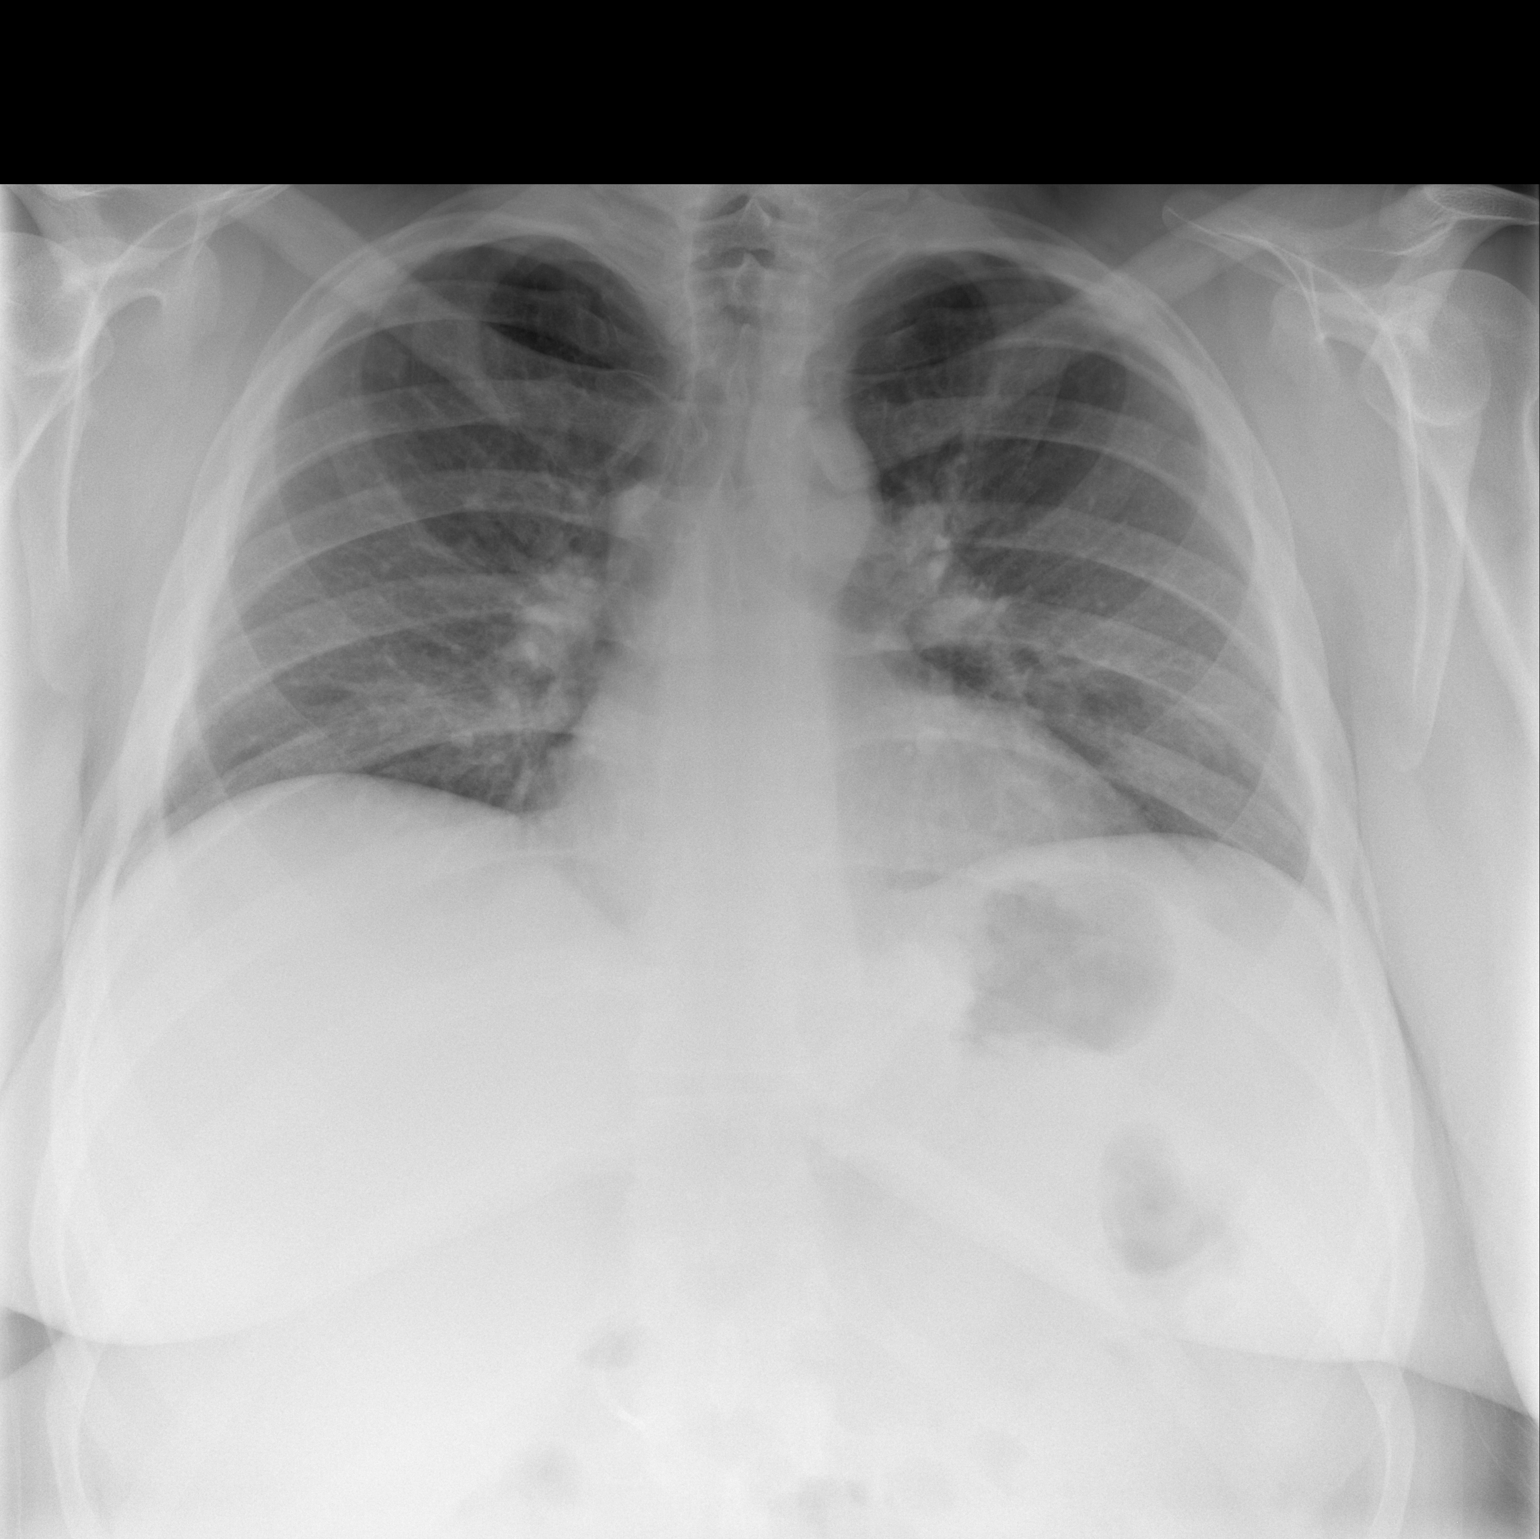

[w chest lat *]
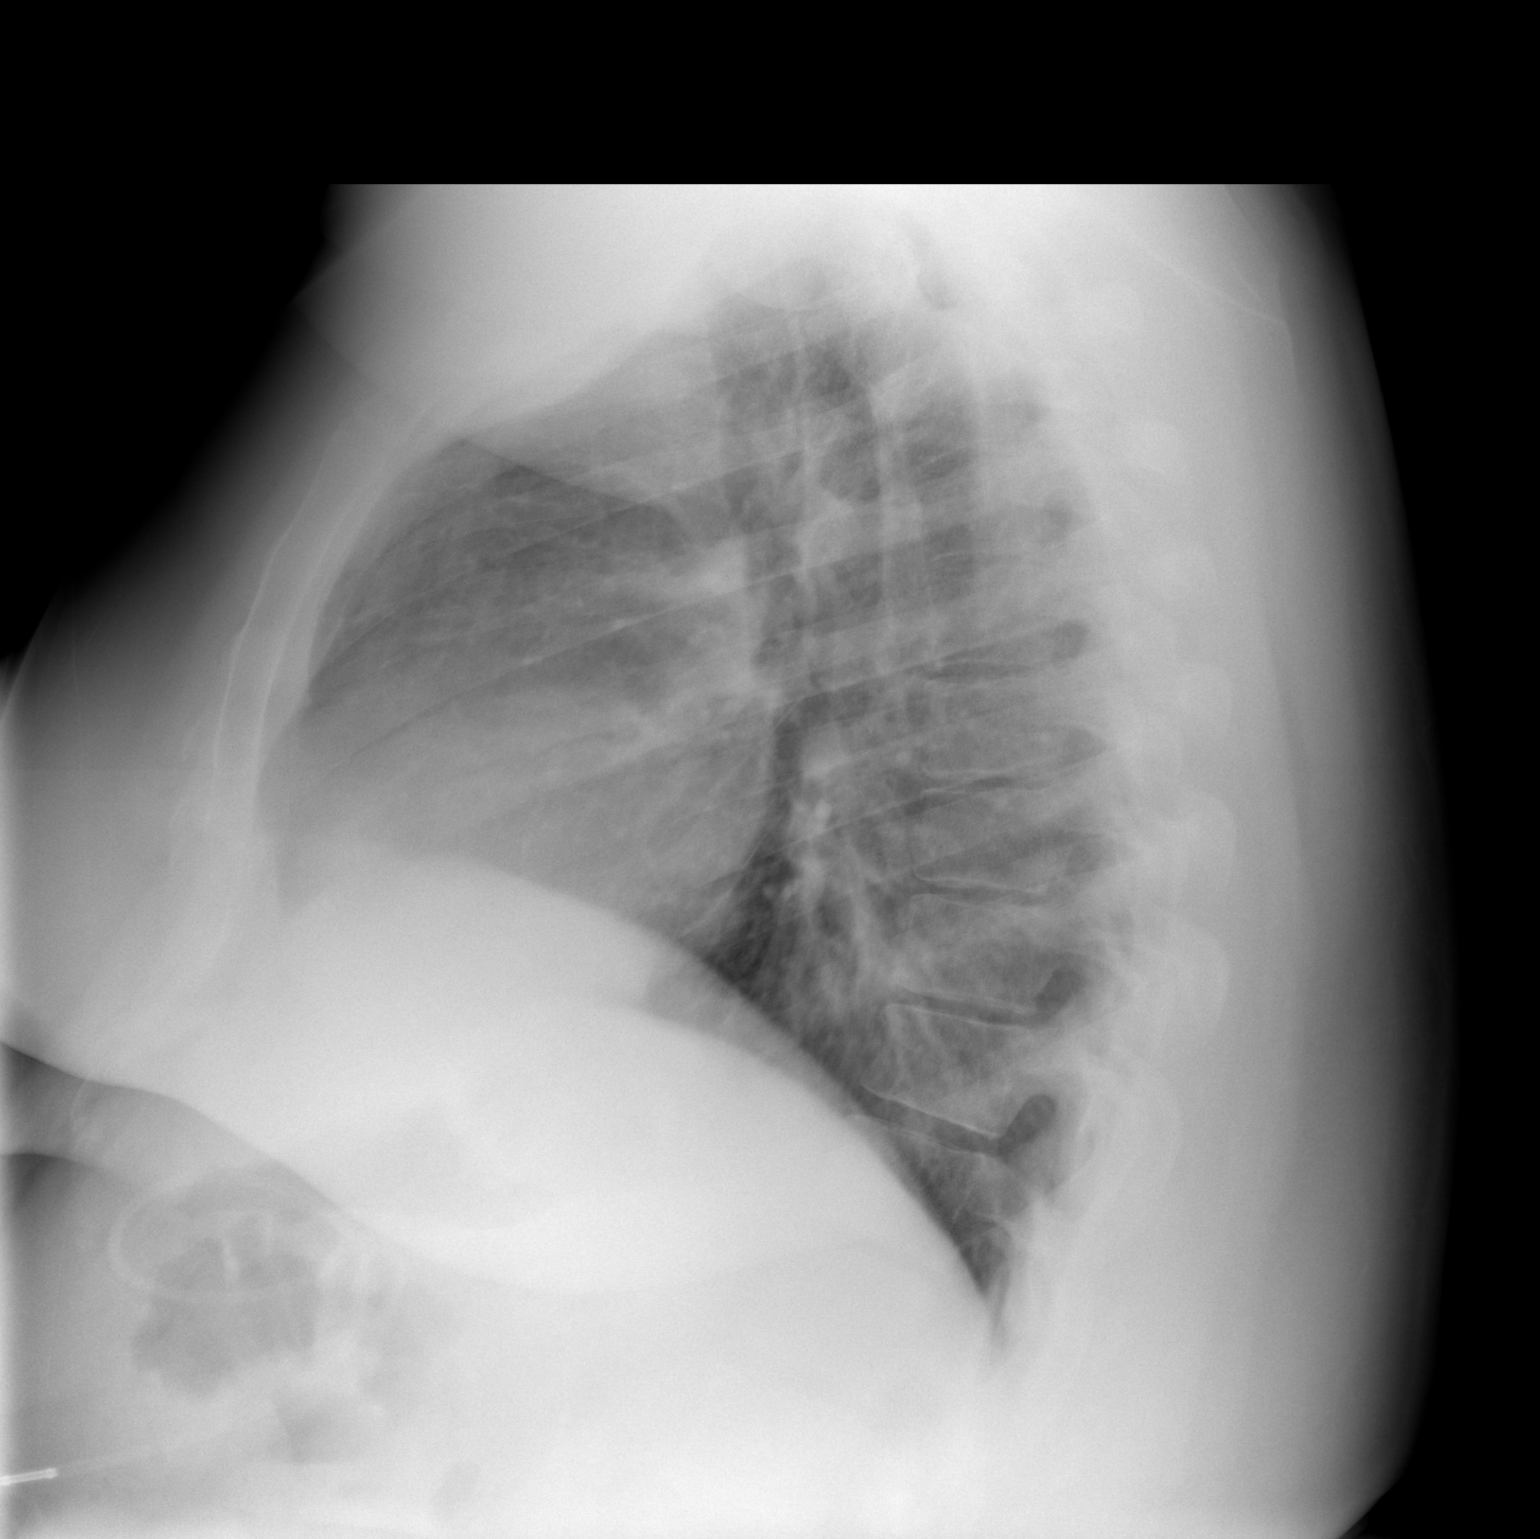

[2 of 2 positions shown; findings below may reference images not displayed]

FINDINGS: The lung volumes are low.

There is no pleural effusion or pulmonary edema.

No airspace consolidation is identified.

No radiopaque foreign bodies or soft tissue calcifications.
IMPRESSION: 1.  Low lung volumes.

## 2011-02-04 ENCOUNTER — Ambulatory Visit: Payer: BC Managed Care – PPO | Admitting: Infectious Diseases

## 2011-02-04 ENCOUNTER — Telehealth: Payer: Self-pay | Admitting: *Deleted

## 2011-02-04 NOTE — Telephone Encounter (Signed)
Pt aware.

## 2011-02-04 NOTE — Telephone Encounter (Signed)
Pt takes Vicoprofen, and wants to know if he can take 2 if he is having bad pain.  7.5 mg.  Needs prescriptions changed if this is ok.

## 2011-02-04 NOTE — Telephone Encounter (Signed)
No I would not take 2 of these together. If one is not enough, we should sit down and figure something else to do

## 2011-02-05 ENCOUNTER — Encounter: Payer: Self-pay | Admitting: Family Medicine

## 2011-02-05 ENCOUNTER — Ambulatory Visit (INDEPENDENT_AMBULATORY_CARE_PROVIDER_SITE_OTHER): Payer: BC Managed Care – PPO | Admitting: Family Medicine

## 2011-02-05 VITALS — BP 130/86 | HR 112 | Temp 98.5°F | Wt >= 6400 oz

## 2011-02-05 DIAGNOSIS — S20229A Contusion of unspecified back wall of thorax, initial encounter: Secondary | ICD-10-CM

## 2011-02-05 DIAGNOSIS — S20219A Contusion of unspecified front wall of thorax, initial encounter: Secondary | ICD-10-CM

## 2011-02-05 DIAGNOSIS — S300XXA Contusion of lower back and pelvis, initial encounter: Secondary | ICD-10-CM

## 2011-02-05 LAB — DIFFERENTIAL
Eosinophils Absolute: 0.2 10*3/uL (ref 0.0–0.7)
Lymphocytes Relative: 37 % (ref 12–46)
Lymphs Abs: 2.8 10*3/uL (ref 0.7–4.0)
Monocytes Relative: 4 % (ref 3–12)
Neutro Abs: 4.2 10*3/uL (ref 1.7–7.7)
Neutrophils Relative %: 56 % (ref 43–77)

## 2011-02-05 LAB — RAPID URINE DRUG SCREEN, HOSP PERFORMED
Amphetamines: NOT DETECTED
Barbiturates: NOT DETECTED
Benzodiazepines: POSITIVE — AB
Opiates: NOT DETECTED

## 2011-02-05 LAB — URINALYSIS, ROUTINE W REFLEX MICROSCOPIC
Bilirubin Urine: NEGATIVE
Nitrite: NEGATIVE
Specific Gravity, Urine: 1.014 (ref 1.005–1.030)
Urobilinogen, UA: 0.2 mg/dL (ref 0.0–1.0)
pH: 7 (ref 5.0–8.0)

## 2011-02-05 LAB — COMPREHENSIVE METABOLIC PANEL
BUN: 14 mg/dL (ref 6–23)
CO2: 26 mEq/L (ref 19–32)
Calcium: 9.4 mg/dL (ref 8.4–10.5)
Creatinine, Ser: 0.71 mg/dL (ref 0.4–1.5)
GFR calc non Af Amer: >60 mL/min (ref >60)
Glucose, Bld: 129 mg/dL — ABNORMAL HIGH (ref 70–99)
Sodium: 134 mEq/L — ABNORMAL LOW (ref 135–145)
Total Protein: 7.5 g/dL (ref 6.0–8.3)

## 2011-02-05 LAB — CBC
Platelets: 259 10*3/uL (ref 150–400)
RDW: 13.4 % (ref 11.5–15.5)
WBC: 7.6 10*3/uL (ref 4.0–10.5)

## 2011-02-05 LAB — ETHANOL: Alcohol, Ethyl (B): <5 mg/dL (ref 0–10)

## 2011-02-05 LAB — ACETAMINOPHEN LEVEL: Acetaminophen (Tylenol), Serum: <10 ug/mL — ABNORMAL LOW (ref 10–30)

## 2011-02-05 LAB — SALICYLATE LEVEL: Salicylate Lvl: <4 mg/dL (ref 2.8–20.0)

## 2011-02-05 MED ORDER — OXYCODONE HCL 10 MG PO TABS: 1.0000 | ORAL_TABLET | Freq: Four times a day (QID) | ORAL | Status: DC | PRN

## 2011-02-05 NOTE — Progress Notes (Signed)
  Subjective:    Patient ID: Angel Pope, male    DOB: 1976-01-08, 35 y.o.   MRN: [id number]  HPI Here for injuries he sustained yesterday morning at home when he tripped and fell down some steps. He was carrying groceries and missed a step. He did not hit his head, but he did hit the side of his jaw. He also landed on his left side. He saw his dentist yesterday afternoon, and he diagnosed him with a dislocated jaw. He is now wearing an appliance in his mouth to keep his jaws straight. He complains of pain in the left ribs which cause him pain on taking a deep breath. No SOB however. He also has spasms and pain in the left lower back which radiate into the left thigh. No numbness or weakness in the legs. He is using ice, Motrin 800mg , and Flexeril. He tried a few Vicoprofen last night as well, but nothing has helped the back pain much.    Review of Systems  Constitutional: Negative.   HENT: Positive for facial swelling and dental problem.   Eyes: Negative.   Respiratory: Negative.   Cardiovascular: Negative.   Musculoskeletal: Positive for back pain.  Neurological: Negative.        Objective:   Physical Exam  Constitutional: He is oriented to person, place, and time. He appears well-developed and well-nourished.  HENT:  Head: Normocephalic.       Wearing a dental appliance which makes talking difficult  Cardiovascular: Normal rate, regular rhythm, normal heart sounds and intact distal pulses.   Pulmonary/Chest: Effort normal and breath sounds normal. No respiratory distress. He has no wheezes. He has no rales.       Tender over the left lateral ribs, no crepitus   Musculoskeletal:       Tender in the left lower back with some spasm. Full ROM. Negative SLR  Neurological: He is alert and oriented to person, place, and time. No cranial nerve deficit. He exhibits normal muscle tone. Coordination normal.          Assessment & Plan:  Use Oxycodone for a few days, rest. Recheck  prn

## 2011-02-08 LAB — DIFFERENTIAL
Basophils Absolute: 0 10*3/uL (ref 0.0–0.1)
Basophils Absolute: 0 K/uL (ref 0.0–0.1)
Basophils Relative: 0 % (ref 0–1)
Eosinophils Absolute: 0.2 10*3/uL (ref 0.0–0.7)
Eosinophils Relative: 2 % (ref 0–5)
Eosinophils Relative: 2 % (ref 0–5)
Lymphocytes Relative: 32 % (ref 12–46)
Lymphocytes Relative: 34 % (ref 12–46)
Lymphs Abs: 3.4 10*3/uL (ref 0.7–4.0)
Monocytes Absolute: 0.7 K/uL (ref 0.1–1.0)
Monocytes Relative: 7 % (ref 3–12)
Neutro Abs: 7.6 10*3/uL (ref 1.7–7.7)
Neutro Abs: 5.5 K/uL (ref 1.7–7.7)
Neutrophils Relative %: 62 % (ref 43–77)
Neutrophils Relative %: 56 % (ref 43–77)

## 2011-02-08 LAB — COMPREHENSIVE METABOLIC PANEL
BUN: 18 mg/dL (ref 6–23)
CO2: 21 mEq/L (ref 19–32)
Calcium: 9.3 mg/dL (ref 8.4–10.5)
Chloride: 105 mEq/L (ref 96–112)
Creatinine, Ser: 1.06 mg/dL (ref 0.4–1.5)
GFR calc non Af Amer: >60 mL/min (ref >60)
Total Bilirubin: 0.5 mg/dL (ref 0.3–1.2)

## 2011-02-08 LAB — BASIC METABOLIC PANEL WITH GFR
BUN: 16 mg/dL (ref 6–23)
CO2: 25 meq/L (ref 19–32)
Calcium: 9.2 mg/dL (ref 8.4–10.5)
GFR calc non Af Amer: >60 mL/min (ref >60)
Glucose, Bld: 80 mg/dL (ref 70–99)

## 2011-02-08 LAB — POCT CARDIAC MARKERS
CKMB, poc: <1 ng/mL — ABNORMAL LOW (ref 1.0–8.0)
Myoglobin, poc: 75.8 ng/mL (ref 12–200)
Troponin i, poc: <0.05 ng/mL (ref 0.00–0.09)

## 2011-02-08 LAB — CBC
HCT: 44.1 % (ref 39.0–52.0)
HCT: 39.4 % (ref 39.0–52.0)
Hemoglobin: 13.7 g/dL (ref 13.0–17.0)
MCHC: 34.6 g/dL (ref 30.0–36.0)
MCHC: 34.8 g/dL (ref 30.0–36.0)
MCV: 85.3 fL (ref 78.0–100.0)
MCV: 84.9 fL (ref 78.0–100.0)
Platelets: 263 K/uL (ref 150–400)
RBC: 5.17 MIL/uL (ref 4.22–5.81)
RBC: 4.65 MIL/uL (ref 4.22–5.81)
RDW: 14.4 % (ref 11.5–15.5)
WBC: 12.3 10*3/uL — ABNORMAL HIGH (ref 4.0–10.5)
WBC: 9.7 10*3/uL (ref 4.0–10.5)

## 2011-02-08 LAB — URINALYSIS, ROUTINE W REFLEX MICROSCOPIC
Hgb urine dipstick: NEGATIVE
Nitrite: NEGATIVE
Protein, ur: 100 mg/dL — AB
Specific Gravity, Urine: 1.038 — ABNORMAL HIGH (ref 1.005–1.030)
Urobilinogen, UA: 1 mg/dL (ref 0.0–1.0)

## 2011-02-08 LAB — D-DIMER, QUANTITATIVE: D-Dimer, Quant: 0.27 ug/mL-FEU (ref 0.00–0.48)

## 2011-02-08 LAB — RAPID URINE DRUG SCREEN, HOSP PERFORMED
Amphetamines: NOT DETECTED
Opiates: POSITIVE — AB
Tetrahydrocannabinol: NOT DETECTED

## 2011-02-08 LAB — BASIC METABOLIC PANEL
Chloride: 106 mEq/L (ref 96–112)
Creatinine, Ser: 0.79 mg/dL (ref 0.4–1.5)
GFR calc Af Amer: >60 mL/min (ref >60)
Potassium: 4.1 mEq/L (ref 3.5–5.1)
Sodium: 140 mEq/L (ref 135–145)

## 2011-02-08 LAB — TRICYCLICS SCREEN, URINE: TCA Scrn: NOT DETECTED

## 2011-02-08 LAB — URINE MICROSCOPIC-ADD ON

## 2011-02-09 LAB — CBC
HCT: 43 % (ref 39.0–52.0)
MCV: 85.2 fL (ref 78.0–100.0)
Platelets: 294 10*3/uL (ref 150–400)
RDW: 13.6 % (ref 11.5–15.5)

## 2011-02-09 LAB — COMPREHENSIVE METABOLIC PANEL
Albumin: 3.5 g/dL (ref 3.5–5.2)
BUN: 15 mg/dL (ref 6–23)
Calcium: 9.1 mg/dL (ref 8.4–10.5)
Creatinine, Ser: 0.79 mg/dL (ref 0.4–1.5)
Total Protein: 7.2 g/dL (ref 6.0–8.3)

## 2011-02-09 LAB — DIFFERENTIAL
Lymphocytes Relative: 44 % (ref 12–46)
Monocytes Absolute: 0.4 10*3/uL (ref 0.1–1.0)
Monocytes Relative: 4 % (ref 3–12)
Neutro Abs: 4.9 10*3/uL (ref 1.7–7.7)

## 2011-02-11 ENCOUNTER — Ambulatory Visit (INDEPENDENT_AMBULATORY_CARE_PROVIDER_SITE_OTHER): Payer: BC Managed Care – PPO | Admitting: Family Medicine

## 2011-02-11 ENCOUNTER — Encounter: Payer: Self-pay | Admitting: Family Medicine

## 2011-02-11 VITALS — BP 140/96 | Wt >= 6400 oz

## 2011-02-11 DIAGNOSIS — M545 Low back pain, unspecified: Secondary | ICD-10-CM

## 2011-02-11 MED ORDER — OXYCODONE HCL 10 MG PO TABS: 1.0000 | ORAL_TABLET | Freq: Four times a day (QID) | ORAL | Status: DC | PRN

## 2011-02-11 NOTE — Progress Notes (Signed)
  Subjective:    Patient ID: Angel Pope, male    DOB: Feb 11, 1976, 35 y.o.   MRN: [id number]  HPI Here for severe low back pain after another fall that occurred at home yesterday . He stepped off his back porch which is several feet off the ground. He landed on his left side, and he has had severe pain since then. The pain shoots down the left leg. He is taking 1 or 2 Oxycodones to get any relief.    Review of Systems  Constitutional: Negative.   Musculoskeletal: Positive for back pain.       Objective:   Physical Exam  Constitutional:       Alert, in pain  Musculoskeletal:       Very tender in the lower back and over the sciatic notch. Reduced ROM and positive SLR on the left           Assessment & Plan:  I suspect he has a herniated disc. Get an MRI scan, rest, refilled the Oxycodone

## 2011-02-14 ENCOUNTER — Ambulatory Visit
Admission: RE | Admit: 2011-02-14 | Discharge: 2011-02-14 | Disposition: A | Discharge status: Home / Self Care | Payer: BC Managed Care – PPO | Source: Ambulatory Visit | Attending: Family Medicine | Admitting: Family Medicine

## 2011-02-14 ENCOUNTER — Other Ambulatory Visit: Payer: Self-pay | Admitting: *Deleted

## 2011-02-14 DIAGNOSIS — M545 Low back pain, unspecified: Secondary | ICD-10-CM

## 2011-02-14 NOTE — Telephone Encounter (Signed)
Spoke to pt- he is taking the oxycodone 1-2 every 6 hours instead of 1 every 6 hours. Pt states that this is what Dr. Clent Ridges told him.  He is having a hard time finding this strength. He would like to have a 5mg  tab and say take 2-4 tabs every 6 hours. Pt is aware that Dr. Clent Ridges is out until Monday and can wait until then.

## 2011-02-14 NOTE — Telephone Encounter (Signed)
Pt needs to speak to Almira Coaster re: his Oxycodone prescription.  He takes more than Dr. Clent Ridges prescribes and the pharmacy is asking for new prescriptions and he is having a problem finding 10 mg?

## 2011-02-18 ENCOUNTER — Telehealth: Payer: Self-pay

## 2011-02-18 DIAGNOSIS — IMO0002 Reserved for concepts with insufficient information to code with codable children: Secondary | ICD-10-CM

## 2011-02-18 MED ORDER — OXYCODONE HCL 5 MG PO TABS: 15.0000 mg | ORAL_TABLET | ORAL | Status: AC | PRN

## 2011-02-18 NOTE — Telephone Encounter (Signed)
Pt requesting MRI results results given  Referral sent to [facility]

## 2011-02-18 NOTE — Telephone Encounter (Signed)
Pt aware and referral to [facility]. Advised pt if not heard in 3 days from [facility] to call office.

## 2011-02-18 NOTE — Telephone Encounter (Signed)
I  wrote a new rx as requested. See the MRI report also and my referral to Neurosurgery

## 2011-02-18 NOTE — Telephone Encounter (Signed)
Message copied by Madison Hickman on Mon Feb 18, 2011  2:39 PM ------      Message from: Dwaine Deter      Created: Mon Feb 18, 2011  9:12 AM       He has bulging discs and some bone spurs in the lower spine. Refer to neurosurgery for this ASAP

## 2011-02-18 NOTE — Telephone Encounter (Signed)
Pt aware rx ready

## 2011-02-20 ENCOUNTER — Other Ambulatory Visit: Payer: Self-pay | Admitting: Family Medicine

## 2011-02-21 ENCOUNTER — Telehealth: Payer: Self-pay | Admitting: *Deleted

## 2011-02-21 NOTE — Telephone Encounter (Signed)
Pt would like to refill Vicoprofen this Friday.   Would like it called to Karin Golden at [facility].

## 2011-02-21 NOTE — Telephone Encounter (Signed)
NO, he got #120 oxycodone tablets just 3 days ago

## 2011-02-21 NOTE — Telephone Encounter (Signed)
Called [facility] and denied hydrocodone request

## 2011-02-22 ENCOUNTER — Telehealth: Payer: Self-pay | Admitting: *Deleted

## 2011-02-22 MED ORDER — HYDROCODONE-IBUPROFEN 7.5-200 MG PO TABS: 1.0000 | ORAL_TABLET | ORAL | Status: DC | PRN

## 2011-02-22 NOTE — Telephone Encounter (Signed)
Too soon for more Oxycodone, but we can call in some more Vicoprofen 7.5/200 to use q 4 hours prn pain. Call in #100 with no rf

## 2011-02-22 NOTE — Telephone Encounter (Signed)
ERROR

## 2011-02-22 NOTE — Telephone Encounter (Signed)
rx called in and pt aware 

## 2011-02-22 NOTE — Telephone Encounter (Signed)
Pt called again this am and stated will be out of oxycodone on Sunday and did see the neurosurgeon this week . Pt was given flexeril and steroids. Asked if given pt med --pt was informed that they want him to keep that with his pcp.   Call to [facility] [employer] back [phone]

## 2011-02-25 ENCOUNTER — Telehealth: Payer: Self-pay | Admitting: *Deleted

## 2011-02-25 DIAGNOSIS — M545 Low back pain, unspecified: Secondary | ICD-10-CM

## 2011-02-25 MED ORDER — OXYCODONE HCL 10 MG PO TABS: 1.0000 | ORAL_TABLET | Freq: Four times a day (QID) | ORAL | Status: DC | PRN

## 2011-02-25 NOTE — Telephone Encounter (Signed)
Pt is asking for Oxycodone.  Vicoprofen not helping with PT.

## 2011-02-25 NOTE — Telephone Encounter (Signed)
Pt informed Rx ready for p/u at office tomorrow.

## 2011-02-25 NOTE — Telephone Encounter (Signed)
done

## 2011-02-28 ENCOUNTER — Telehealth: Payer: Self-pay | Admitting: *Deleted

## 2011-02-28 NOTE — Telephone Encounter (Signed)
Cannot find Oxycodone 10 mg anywhere in town, and is asking for a 5 mg prescription.
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
## 2011-02-28 NOTE — Telephone Encounter (Signed)
Can you please advise on this? Something is not sound right.

## 2011-02-28 NOTE — Telephone Encounter (Signed)
Before I could write a rx for any more Oxycodone, he would need to bring the original rx for the 10 mg back in for [place] to destroy it.

## 2011-03-01 ENCOUNTER — Encounter: Payer: Self-pay | Admitting: *Deleted

## 2011-03-01 NOTE — Telephone Encounter (Signed)
Do not give him any more meds at all. I will be discharging him  from my practice due to obtaining narcotics from multiple providers.

## 2011-03-01 NOTE — Telephone Encounter (Signed)
Pt is doing physical therapy and he is feeling better.  I told pt we could not give him another rx right now and he said he was fine and would make do with what he has.

## 2011-03-04 ENCOUNTER — Ambulatory Visit: Payer: BC Managed Care – PPO | Admitting: *Deleted

## 2011-03-13 ENCOUNTER — Other Ambulatory Visit: Payer: Self-pay | Admitting: Family Medicine

## 2011-03-13 ENCOUNTER — Telehealth: Payer: Self-pay | Admitting: *Deleted

## 2011-03-13 ENCOUNTER — Ambulatory Visit
Admission: RE | Admit: 2011-03-13 | Discharge: 2011-03-13 | Disposition: A | Discharge status: Home / Self Care | Payer: BC Managed Care – PPO | Source: Ambulatory Visit | Attending: Family Medicine | Admitting: Family Medicine

## 2011-03-13 DIAGNOSIS — W19XXXA Unspecified fall, initial encounter: Secondary | ICD-10-CM
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
## 2011-03-14 ENCOUNTER — Telehealth: Payer: Self-pay | Admitting: Family Medicine

## 2011-03-14 NOTE — Telephone Encounter (Signed)
Dismissal letter sent out by registered/certified mail 03/11/11. °

## 2011-03-18 ENCOUNTER — Ambulatory Visit: Payer: BC Managed Care – PPO | Admitting: *Deleted

## 2011-03-19 NOTE — Op Note (Signed)
NAME:  Angel Pope, Angel Pope NO.:  [id number]   MEDICAL RECORD NO.:  [id number]          PATIENT TYPE:  AMB   LOCATION:  DAY                          FACILITY:  [facility]   PHYSICIAN:  Sharlet Salina T. Hoxworth, M.D.DATE OF BIRTH:  December 31, 1975   DATE OF PROCEDURE:  06/06/2009  DATE OF DISCHARGE:                               OPERATIVE REPORT   PREOPERATIVE DIAGNOSIS:  Anal fissure.   POSTOPERATIVE DIAGNOSIS:  Anal fissure.   SURGICAL PROCEDURE:  Lateral internal anal sphincterotomy.   SURGEON:  Dr. Johna Sheriff.   ANESTHESIA:  General.   BRIEF HISTORY:  Angel Pope is a 35 year old male with a previous  history of anal fissure treated with sphincterotomy.  He now presents  with persistent rectal pain.  He has morbid obesity and difficult  physical exam and has had persistent marked tenderness in the posterior  anal canal with intermittent bleeding with bowel movements.  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  The nature of the procedure,  indications, risks of bleeding, infection and degrees of incontinence  were discussed and understood.  He is now brought to the operating room  for this procedure.   DESCRIPTION OF OPERATION:  The patient was brought to operating room,  placed in a supine position on the operating table and a general  endotracheal anesthesia was induced.  He was then carefully positioned  in lithotomy position.  The perineum sterilely prepped and draped.  He  is given preoperative IV antibiotics and correct patient and procedure  were verified.  Examination of the anus with a scope revealed a good-  sized posterior anal fissure.  I did not see any other evidence of  pathology other than some mild noninflamed hemorrhoids.  The internal  anal sphincter did feel hypertrophied and distinct.  I then elected to  proceed with sphincterotomy.  The internal anal sphincter and  intersphincteric groove were identified in the left lateral position and  a small stab incision made.  The tip of a hemostat was inserted into the  intersphincteric groove and then the hypertrophied portion of the  internal anal sphincter was elevated up into the incision and divided  with cautery with good relaxation.  Hemostasis was obtained with  pressure and cautery and a perianal block of Marcaine with epinephrine  was performed.  Sponge, needle and instrument counts were correct.  The  patient was taken to recovery in good condition.      Lorne Skeens. Hoxworth, M.D.  Electronically Signed     BTH/MEDQ  D:  06/06/2009  T:  06/06/2009  Job:  [id number]

## 2011-03-19 NOTE — Op Note (Signed)
NAME:  Angel Pope, Angel Pope NO.:  [id number]   MEDICAL RECORD NO.:  [id number]          PATIENT TYPE:  OIB   LOCATION:  0098                         FACILITY:  [facility]   PHYSICIAN:  Sharlet Salina T. Hoxworth, M.D.DATE OF BIRTH:  1975/11/08   DATE OF PROCEDURE:  06/06/2008  DATE OF DISCHARGE:                               OPERATIVE REPORT   PREOPERATIVE DIAGNOSIS:  Morbid obesity.   POSTOPERATIVE DIAGNOSIS:  Morbid obesity.   SURGICAL PROCEDURE:  Laparoscopic adjustable gastric band.   HISTORY OF PRESENT ILLNESS:  Angel Pope is a 35 year old male who  presents with extreme morbid obesity at 445 pounds, BMI of 55.37.  He  has comorbidities of obstructive sleep apnea and musculoskeletal  disease.  After extensive discussion detailed elsewhere, we have elected  to proceed with laparoscopic adjustable gastric band for treatment of  his morbid obesity.   DESCRIPTION OF OPERATION:  The patient was brought to the operating room  and placed in the supine position on the operating table, and general  orotracheal anesthesia was induced.  The abdomen was widely sterilely  prepped and draped.  He received preoperative subcutaneous heparin and  IV antibiotics.  PS were in place.  Correct patient and procedure were  verified.  Access was obtained through a 1 cm incision in the left  subcostal space with the OptiVu trocar without difficulty.  Pneumoperitoneum was established.  Under direct vision, a 15 mm trocar  was placed laterally in the right upper abdomen, an 11 mm trocar more  medially in the right upper quadrant, an 11 mm trocar just left to the  midline for the camera port, and an additional 5 mm trocar in the left  flank.  A Nathanson retractor was placed through a 5 mm epigastric site  with excellent exposure of the hiatus and the stomach.  The patient had  a previous upper GI series and there was no gross evidence of hiatal  hernia on intraoperative exam nor on his  preoperative upper GI.  The  peritoneum overlying the left crus was incised and dissection carried  back down along the crus toward the retrogastric space.  The pars  flaccid was opened with the cautery and essentially fat replaced.  The  base of the right crus was identified and at the crossing fat, the  peritoneum was incised and the finger dissector passed bluntly into this  space without difficulty, passed retrogastric and deployed up through  the previously dissected area at the angle of His without difficulty.  An AP large lap band system was chosen and was introduced in the  abdomen, the tubing passed through the finger dissector and additionally  sutured through the hole in the finger dissector, holding it intact, and  then brought back behind the stomach.  The band was then brought back  behind the stomach without difficulty.  With the sizing tube introduced  orally into the stomach, the band was locked into place without any  tightness.  The sizing tube was removed.  Holding the tubing toward the  patient's feet, the fundus beginning at the mid angle of the His was  imbricated up over the band of the small gastric pouch with three  interrupted 2-0 Ethibond sutures.  The band appeared to be in excellent  position without any undue tightness.  The Nathanson retractor was  removed.  The tubing was brought out through the right mid abdominal  trocar site and all CO2 evacuated.  Trocars were removed.  This incision  was lengthened somewhat and the anterior fascia exposed and four 2-0  Prolene sutures placed for securing the port.  The tubing was cut,  attached to the port which was then attached to the abdominal wall with  previously-placed sutures.  The tubing was seen to curve smoothly into  the  abdomen.  The incisions were irrigated.  The subcutaneous was closed in  the port site with a running 2-0 Vicryl, and then all skin incisions  were closed with subcuticular Monocryl and  Dermabond.  Sponge, needle,  and instrument counts correct.  The patient was taken to recovery in  good condition.      Lorne Skeens. Hoxworth, M.D.  Electronically Signed     BTH/MEDQ  D:  06/06/2008  T:  06/06/2008  Job:  [id number]

## 2011-03-22 ENCOUNTER — Encounter (HOSPITAL_COMMUNITY)
Admission: RE | Admit: 2011-03-22 | Discharge: 2011-03-22 | Disposition: A | Discharge status: Home / Self Care | Payer: BC Managed Care – PPO | Source: Ambulatory Visit | Attending: Otolaryngology | Admitting: Otolaryngology

## 2011-03-22 LAB — CBC
HCT: 34.6 % — ABNORMAL LOW (ref 39.0–52.0)
Hemoglobin: 12 g/dL — ABNORMAL LOW (ref 13.0–17.0)
RBC: 4.25 MIL/uL (ref 4.22–5.81)
RDW: 13.3 % (ref 11.5–15.5)
WBC: 8.5 10*3/uL (ref 4.0–10.5)

## 2011-03-22 LAB — SURGICAL PCR SCREEN: MRSA, PCR: NEGATIVE

## 2011-03-22 NOTE — Procedures (Signed)
NAME:  Angel Pope, Angel Pope NO.:  [id number]   MEDICAL RECORD NO.:  [id number]          PATIENT TYPE:  OUT   LOCATION:  SLEEP CENTER                 FACILITY:  [facility]   PHYSICIAN:  Clinton D. Maple Hudson, MD, FCCP, FACPDATE OF BIRTH:  Mar 12, 1978   DATE OF STUDY:  01/01/2007                            NOCTURNAL POLYSOMNOGRAM   REFERRING PHYSICIAN:  Antony Contras   INDICATIONS FOR PROCEDURE:  Hypersomnia with sleep apnea.   RESULTS:  Epworth sleepiness score 17/24.      Clinton D. Maple Hudson, MD, [facility] Bollman, FACP  Diplomate, American Board of Sleep Medicine     CDY/MEDQ  D:  01/25/2007 14:38:28  T:  01/25/2007 18:01:20  Job:  [id number]

## 2011-03-22 NOTE — Procedures (Signed)
NAME:  Angel Pope, Angel Pope NO.:  [id number]   MEDICAL RECORD NO.:  [id number]          PATIENT TYPE:  OUT   LOCATION:  SLEEP CENTER                 FACILITY:  [facility]   PHYSICIAN:  Clinton D. Maple Hudson, MD, FCCP, FACPDATE OF BIRTH:  04-Apr-1976   DATE OF STUDY:  01/01/2007                            NOCTURNAL POLYSOMNOGRAM   INDICATIONS FOR PROCEDURE:  Hypersomnia with sleep apnea.   RESULTS:  Epward sleepiness score 17/24. BMI 55.8. Weight 450 pounds.   HOME MEDICATIONS:  Includes Nardil and Lorazepam.   SLEEP ARCHITECTURE:  Total sleep time 371 minutes with sleep efficiency  85%. Stage 1 was 3%; Stage 2, 70%; Stages 3 and 4, 10%. REM 17% of total  sleep time. Sleep latency 22 minutes, REM latency 137 minuets. Awake  after sleep onset 43 minutes. Arousal index 11.5. No bedtime medication  was taken.   RESPIRATORY DATA:  Split study protocol. Apnea hypopnea index (AHI, RDI)  103 obstructive events per hour, indicating severe obstructive sleep  apnea before CPAP. There were 96 obstructive apnea's and 170 hypopnea's  before CPAP. Events were strongly associated with supine sleep position  but almost all sleep time was supine. REM AHI 3.8. CPAP was titrated to  10 CWP, AHI 7.5 per hour. A medium full face ResMed Ultra Mirage mask  was used with heated humidifier. Documented titration values were  documented up to a maximum of 10 CWP. The technician indicates that  actual titration was taken to 12 CWP, which looks appropriate.   OXYGEN DATA:  Very loud snoring with oxygen desaturation to a nadir of  79%. Oxygen saturation after CPAP control held at 95% on room air.   CARDIAC DATA:  Normal sinus rhythm.   MOVEMENT/PARASOMNIA:  Insignificant movement disturbance. Bathroom x1.   IMPRESSION/RECOMMENDATION:  1. Very severe obstructive sleep apnea/hypopnea syndrome, AHI 103 per      hour with most events and most sleep recorded while supine. Very      loud snoring with oxygen  desaturation to a nadir of 79%.  2. CPAP titration to a recommended initial home trial pressure of 12      CWP. The tabular documentation for the study extended to 10 CWP      with a residual AHI of 7.5 per hour. A trial at 12 CWP as indicated      by the      technicians notation, appears appropriate. Medium ResMed full face      Ultra Mirage mask with heated humidifier chosen.      Clinton D. Maple Hudson, MD, [facility], FACP  Diplomate, Biomedical engineer of Sleep Medicine  Electronically Signed     CDY/MEDQ  D:  01/04/2007 11:40:02  T:  01/04/2007 19:29:27  Job:  [id number]

## 2011-03-22 NOTE — Letter (Signed)
September 21, 2007     RE:  PATRICIA, PERALES  MRN:  [id number]  /  DOB:  [birth year]   Greetings To Whom it May Concern:   This letter is concerning a patient of mine by the name of Lisle Skillman (date of birth October 11, 1976).  I have been following this  patient as his primary care physician for a number of problems including  morbid obesity, sleep apnea, overactive bladder, depression, chronic low  back pain, and plantar fasciitis.  Most of these problems are directly  attributable to his extreme obesity.  He does use a CPAP machine to help  with his sleep apnea, which can be a dangerous and life-threatening  condition.  Lerone has had several periods of his life over the past few  years where he has followed strict diets, supervised both by myself and  by a dietician as well as exercising.  He exercises faithfully at a gym  3-4 days a week.  During one period from March to September of this  year, he followed a strict high protein, low calorie diet and was only  able to lose 5 pounds.  Also, last year in 2007, from January to  December, he followed a low carbohydrate diet and was able to lose only  30 pounds.  Despite these efforts, he has maintained a constant body to  mass index anywhere from 52 to 61, and this represents an extreme  medical condition for him.  I do now feel that a gastric bypass  procedure is the only thing that offers him any real hope at a decent  quality of life and as well as relatively normal life  span.  Hopefully, this procedure can be undertaken at some point in the  near future so that Laymon can began enjoying a healthier and happier  lifestyle.   If I may be of further assistance, please let me know.    Sincerely,      Tera Mater. Clent Ridges, MD  Electronically Signed    SAF/MedQ  DD: 09/21/2007  DT: 09/21/2007  Job #: [id number]

## 2011-03-22 NOTE — Op Note (Signed)
NAME:  Angel Pope, Angel Pope NO.:  [id number]   MEDICAL RECORD NO.:  [id number]          PATIENT TYPE:  AMB   LOCATION:  DAY                          FACILITY:  [facility]   PHYSICIAN:  Thomas A. Cornett, M.D.DATE OF BIRTH:  09-29-1976   DATE OF PROCEDURE:  11/27/2005  DATE OF DISCHARGE:                                 OPERATIVE REPORT   PREOPERATIVE DIAGNOSIS:  Non-healing posterior anal fissure.   POSTOPERATIVE DIAGNOSIS:  Non-healing posterior anal fissure.   PROCEDURE:  Right lateral internal sphincterotomy.   SURGEON:  Maisie Fus A. Cornett, M.D.   ANESTHESIA:  General endotracheal anesthesia.   ESTIMATED BLOOD LOSS:  10 cc.   LOCATION ANESTHESIA USED:  20 cc of 0.25% Sensorcaine.   INDICATIONS FOR PROCEDURE:  The patient is a 35 year old male with a non-  healing posterior midline anal fissure. He has been placed on medical  management but continues to have severe pain and non-healing fissure after  many weeks of this.  A length discussion of further treatment options was  done with the patient which included lateral internal sphincterotomy as well  as continuing with his medical management. I explained the risks of the  lateral internal sphincterotomy which include incontinence, bleeding,  infection, and also the procedure may not be successful. I also discussed  with him continuing his medical management. He stated he wished to proceed  with surgery at this point in time, since he did not feel medical management  was of any benefit to him. Informed consent was obtained.   DESCRIPTION OF PROCEDURE:  The patient was brought to the operating room and  placed supine. After induction of general endotracheal anesthesia, the  patient was then placed in a prone jackknife position. The buttocks were  taped apart, and the patient was safely secured to the table. Next, the  perianal region was prepped and draped in a sterile fashion. Digital rectal  examination was done.  The patient had normal rectal tone. Of note, a  posterior midline fissure with partial healing was noted. Next, a  proctoscope was placed, and the right lateral position of his anal canal was  chosen for his lateral internal sphincterotomy. I used my finger and  withdrew the finger from the rectum until I could feel the intersphincteric  groove between the external and internal sphincter muscles. Once I could  feel this, I used local anesthesia and injected the mucosa and anoderm. A  small incision was made just at the anal verge up to the dentate line with a  scalpel. I used a hemostat to spread the mucosa and identified the internal  sphincter. These were internal circular fibers. I again re-examined the  patient to make sure I was in the proper plane, which felt like I was. The  fibers were very thin but were circular in nature. I then used a hemostat,  placed it underneath the circular fibers, and used the cautery to cut them  up to the dentate line at which point I stopped. Hemostasis was excellent. I  reapproximated the mucosa at the dentate line and the anoderm using a 4-0  Monocryl,  leaving the very distal end of the anoderm open for drainage. I then placed  Gelfoam for good hemostasis. The patient was then placed supine, extubated,  and taken to recovery in satisfactory condition. All sponge, needle, and  instrument counts were found to be correct.      Thomas A. Cornett, M.D.  Electronically Signed     TAC/MEDQ  D:  11/27/2005  T:  11/28/2005  Job:  [id number]   cc:   [facility]

## 2011-03-29 ENCOUNTER — Observation Stay (HOSPITAL_COMMUNITY)
Admission: RE | Admit: 2011-03-29 | Discharge: 2011-03-30 | Disposition: A | Discharge status: Home / Self Care | Payer: BC Managed Care – PPO | Source: Ambulatory Visit | Attending: Otolaryngology | Admitting: Otolaryngology

## 2011-03-29 DIAGNOSIS — G4733 Obstructive sleep apnea (adult) (pediatric): Principal | ICD-10-CM | POA: Insufficient documentation

## 2011-03-29 DIAGNOSIS — J343 Hypertrophy of nasal turbinates: Secondary | ICD-10-CM | POA: Insufficient documentation

## 2011-03-29 DIAGNOSIS — Z01810 Encounter for preprocedural cardiovascular examination: Secondary | ICD-10-CM | POA: Insufficient documentation

## 2011-03-29 DIAGNOSIS — Z01812 Encounter for preprocedural laboratory examination: Secondary | ICD-10-CM | POA: Insufficient documentation

## 2011-03-29 DIAGNOSIS — J342 Deviated nasal septum: Secondary | ICD-10-CM | POA: Insufficient documentation

## 2011-04-11 ENCOUNTER — Other Ambulatory Visit: Payer: Self-pay | Admitting: Infectious Diseases

## 2011-04-12 ENCOUNTER — Other Ambulatory Visit: Payer: Self-pay | Admitting: *Deleted

## 2011-04-12 DIAGNOSIS — K219 Gastro-esophageal reflux disease without esophagitis: Secondary | ICD-10-CM

## 2011-04-12 MED ORDER — OMEPRAZOLE 40 MG PO CPDR: 40.0000 mg | DELAYED_RELEASE_CAPSULE | Freq: Every day | ORAL | Status: DC

## 2011-05-29 NOTE — Op Note (Signed)
NAME:  JACARIUS, HANDEL NO.:  [id number]  MEDICAL RECORD NO.:  [id number]           PATIENT TYPE:  O  LOCATION:  3314                         FACILITY:  [facility]  PHYSICIAN:  Antony Contras, MD     DATE OF BIRTH:  02/04/1976  DATE OF PROCEDURE:  03/29/2011 DATE OF DISCHARGE:                              OPERATIVE REPORT   PREOPERATIVE DIAGNOSES: 1. Obstructive sleep apnea. 2. Nasal septal deviation. 3. Inferior turbinate hypertrophy.  POSTOPERATIVE DIAGNOSES: 1. Obstructive sleep apnea. 2. Nasal septal deviation. 3. Inferior turbinate hypertrophy.  PROCEDURES: 1. Nasal septoplasty. 2. Bilateral inferior turbinate reduction.  SURGEON:  Excell Seltzer. Jenne Pane, MD  ANESTHESIA:  General endotracheal anesthesia.  COMPLICATIONS:  None.  INDICATIONS:  The patient is a 35 year old white male with a 4-year history of severe nasal obstruction in the left side with whistling sounds when he breathes through his nose.  He has been wearing CPAP for 3 years for sleep apnea but it has been difficult to tolerate lately. He was found to have a septum deviated toward the right side with a large left-sided spur as well as enlarged turbinates.  He presents to the operating room for surgical management.  FINDINGS:  As above.  DESCRIPTION OF PROCEDURE:  The patient was identified in the holding room and informed consent having been obtained including discussion of risks, benefits, alternatives, the patient was brought to the office suite and put on the operative table in supine position.  Anesthesia was induced and the patient was intubated by the Anesthesia Team without difficulty.  The patient was given intravenous antibiotics during the case.  The eyes were taped closed and the nose was prepped and draped in a sterile fashion.  Afrin-soaked pledgets were placed on both sides of the nose for several minutes and then removed.  The nasal septum was injected with 1% lidocaine  with 1:100,000 epinephrine on both sides.  A left-sided Killian incision was made and the subperichondrial flap was elevated back beyond the bony cartilaginous junction.  The junction was divided and the posterior bone removed in a piecemeal fashion using a rongeur and duckbill forceps.  The large left-sided spur was then reduced first by removing the cartilage of the spur with a Therapist, nutritional and then by elevating the soft tissues off the bony portion of the spur and removing it with an osteotome.  The quadrangular cartilage was then partially removed removing the posterior segment using a #15 blade scalpel and a Therapist, nutritional.  A generous dorsal and caudal struts were left in place.  During the septoplasty, a hole was formed in the left-sided flap inferiorly along where the spur was as well as a hole in the right-sided flap from incising the quadrangular cartilage.  These holes did not line up with one another.  At this point, the inferior turbinates were injected with local anesthetic on both sides.  Stab incisions were made on the anterior head of each turbinate and soft tissues were elevated with a Therapist, nutritional.  The submucosal tissue was then removed using the turbinate blade on the microdebrider, keeping the overlying mucosa intact.  The soft  tissue was then redraped and the turbinates were lateralized with a Therapist, nutritional.  The nose was suctioned and Doyle splints coated in bacitracin ointment were placed in both sides of the nose.  The Killian incision was closed with 4-0 chromic in a simple interrupted fashion.  The stents were then secured at the anterior part of the septum using a single 2-0 chromic through- and-through mattress suture.  The throat was suctioned and the patient returned to Anesthesia for wake-up.  He was extubated and moved to the recovery room in stable condition.     Antony Contras, MD     DDB/MEDQ  D:  03/29/2011  T:  03/29/2011  Job:   [id number]  Electronically Signed by Christia Reading MD on 05/29/2011 08:10:01 PM

## 2011-06-04 IMAGING — RF DG ESOPHAGUS
12 of 24 series · 12 of 24 positions shown · non-contrast
Comparison: [HOSPITAL] esophagram 08/03/2009.

CLINICAL DATA: Nausea and vomiting since banding with the lap band
[DATE].

ESOPHOGRAM/BARIUM SWALLOW
TECHNIQUE: Single contrast barium examination with several
swallows of thin barium performed in upright position.
Fluoroscopy time:  2.7 minutes.

[Series 2: run · 1 of 1 slices shown (1 of 12)]
[im 1/1]
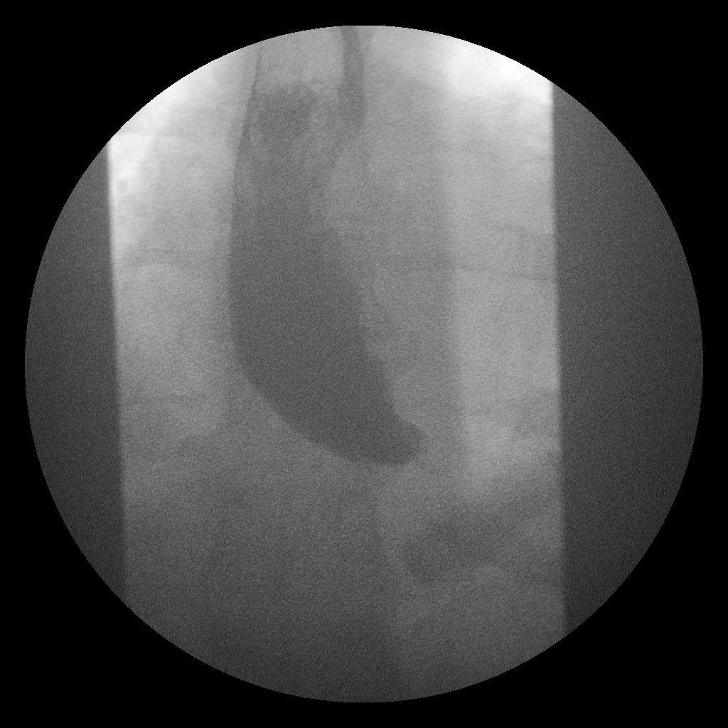

[Series 4: run · 1 of 1 slices shown (2 of 12)]
[im 1/1]
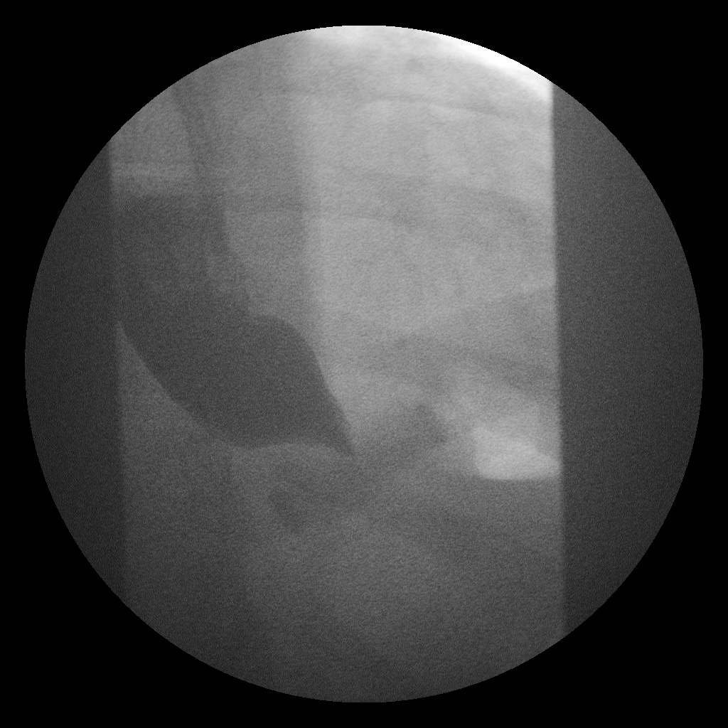

[Series 6: run · 1 of 1 slices shown (3 of 12)]
[im 1/1]
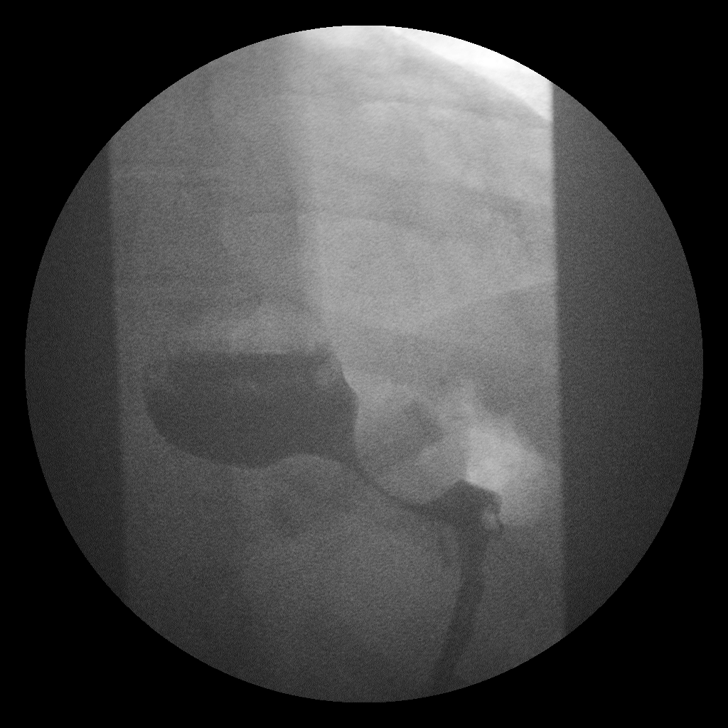

[Series 8: run · 1 of 1 slices shown (4 of 12)]
[im 1/1]
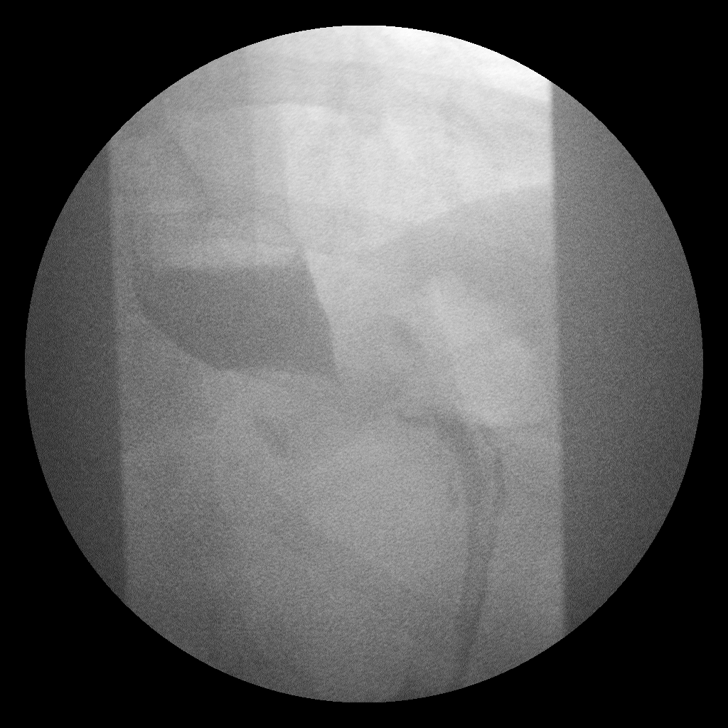

[Series 10: run · 1 of 1 slices shown (5 of 12)]
[im 1/1]
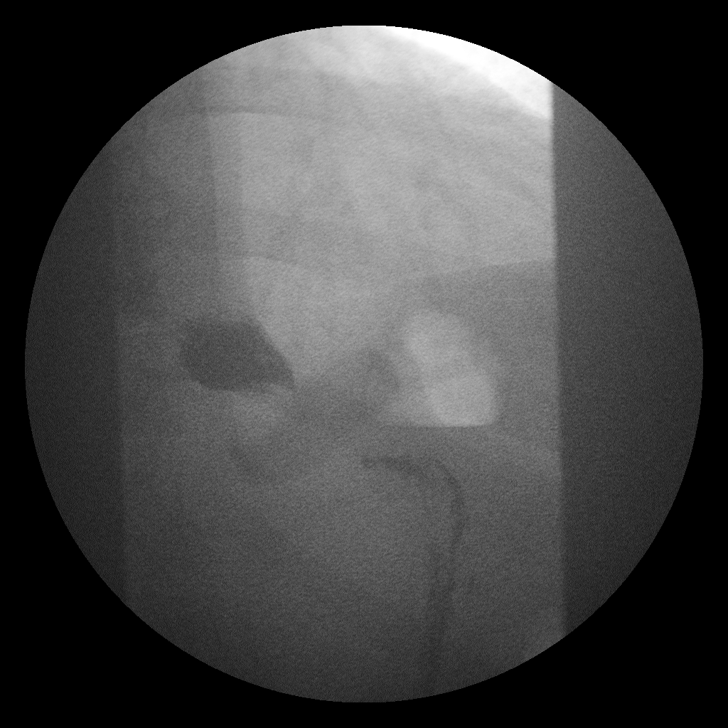

[Series 12: run · 1 of 3 slices shown (6 of 12)]
[im 1/3]
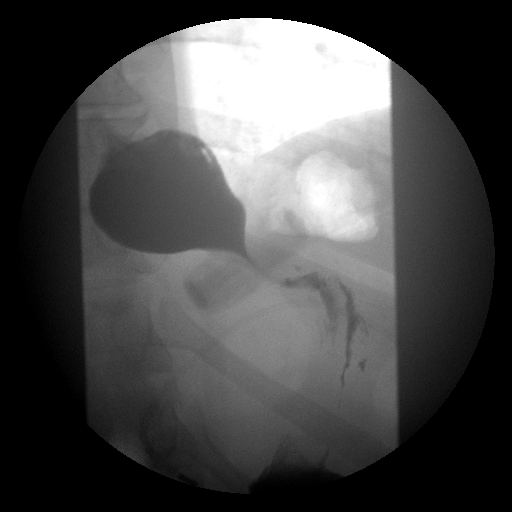

[Series 14: run · 1 of 1 slices shown (7 of 12)]
[im 1/1]
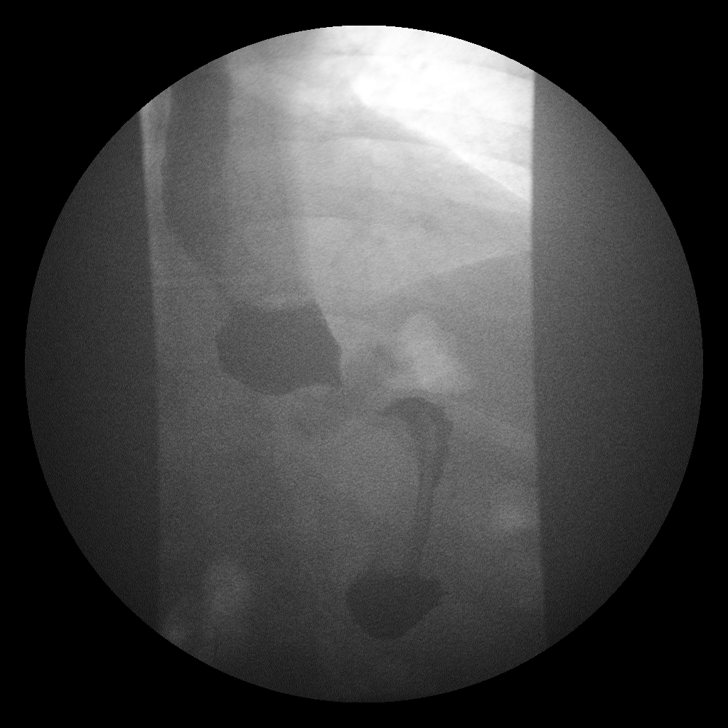

[Series 16: run · 1 of 1 slices shown (8 of 12)]
[im 1/1]
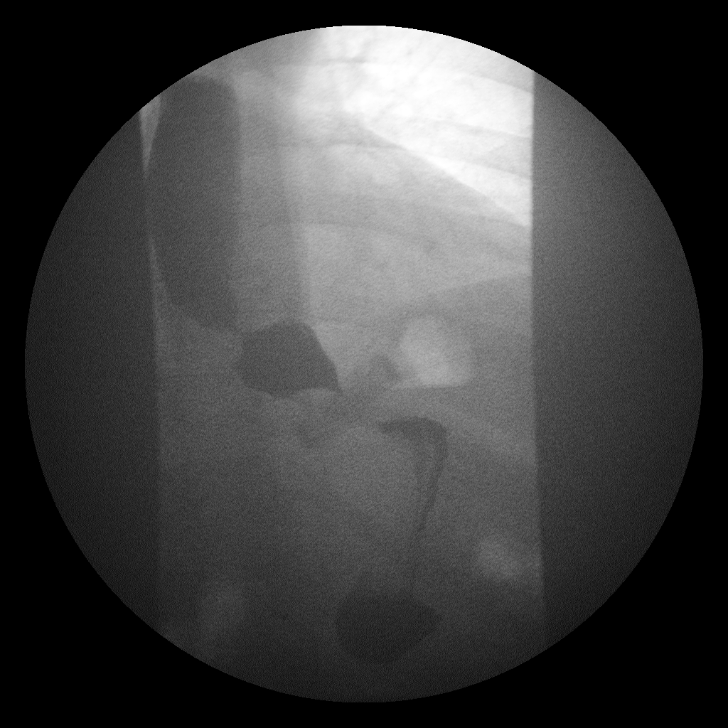

[Series 18: run · 1 of 1 slices shown (9 of 12)]
[im 1/1]
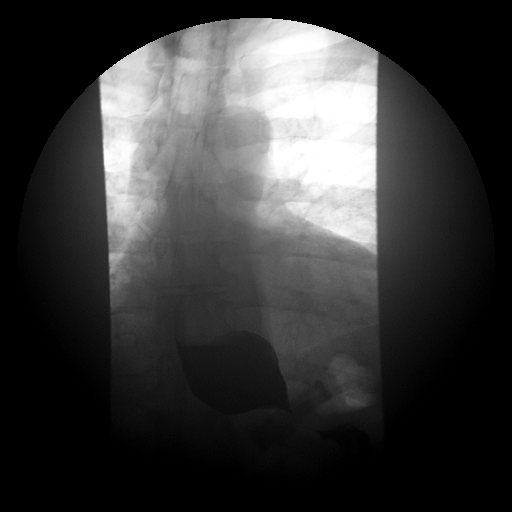

[Series 20: run · 1 of 1 slices shown (10 of 12)]
[im 1/1]
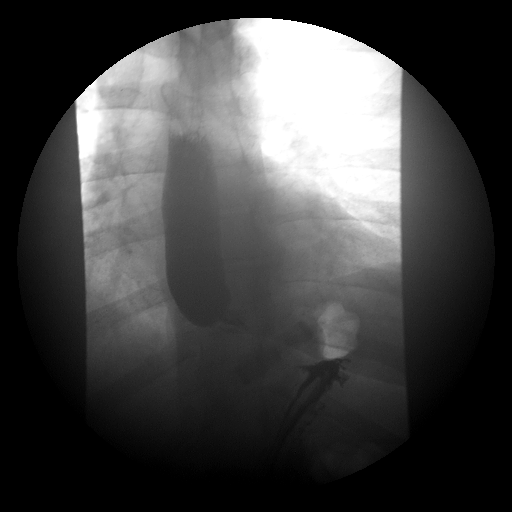

[Series 22: run · 1 of 7 slices shown (11 of 12)]
[im 1/7]
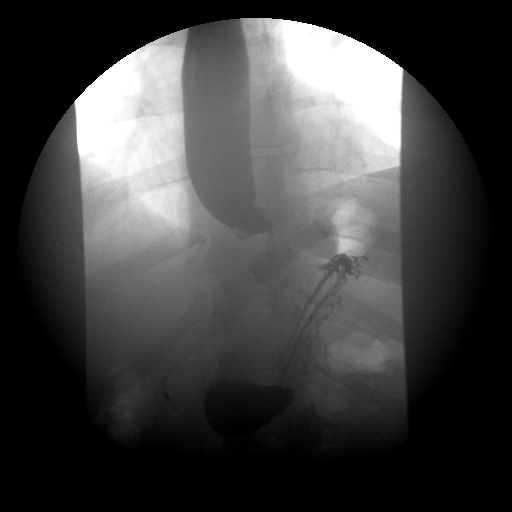

[Series 24: run · 1 of 1 slices shown (12 of 12)]
[im 1/1]
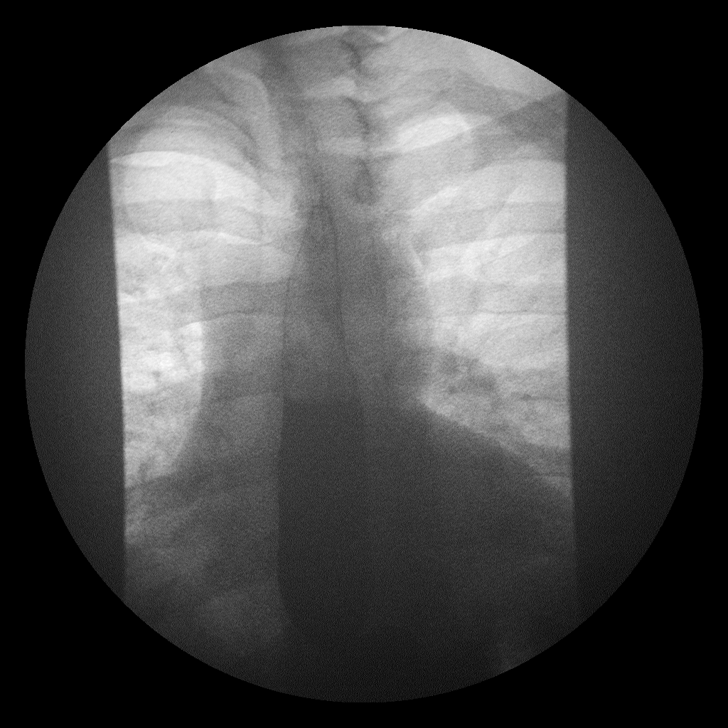

[12 of 24 positions shown; findings below may reference images not displayed]

FINDINGS: Scout view demonstrates stable satisfactory orientation
lap band since prior study.  No significant change since prior
study in marked narrowing through the gastric band segment with
proximal dilatation of the stable small hiatus hernia and thoracic
esophagus consistent with high grade partial obstruction at the lap
band level.  Residual barium seen within esophagus over 22 minutes
with episodes of reversed peristalsis within thoracic esophagus in
upright position.  No 13 mm barium tablet administered as a result.
No other new intrinsic or extrinsic esophageal lesions seen.
IMPRESSION: 1.  Stable orientation lap band without obvious slippage.
2.  High grade partial obstruction at the level of lap band.
3.  Stable small hiatus hernia and dilated thoracic esophagus with
reversed peristalsis associated with high grade partial obstruction
distally.
4.  Otherwise, no new significant abnormality.

## 2011-08-02 LAB — DIFFERENTIAL
Eosinophils Absolute: 0.1
Eosinophils Relative: 1
Lymphs Abs: 3
Monocytes Absolute: 0.5
Monocytes Relative: 6

## 2011-08-02 LAB — CBC
HCT: 42.8
MCV: 84.8
RBC: 5.04
WBC: 8.5

## 2012-03-26 ENCOUNTER — Ambulatory Visit (INDEPENDENT_AMBULATORY_CARE_PROVIDER_SITE_OTHER): Payer: BC Managed Care – PPO | Admitting: General Surgery

## 2012-03-26 ENCOUNTER — Encounter (INDEPENDENT_AMBULATORY_CARE_PROVIDER_SITE_OTHER): Payer: Self-pay | Admitting: General Surgery

## 2012-03-26 ENCOUNTER — Encounter (INDEPENDENT_AMBULATORY_CARE_PROVIDER_SITE_OTHER): Payer: Self-pay

## 2012-03-26 VITALS — BP 140/90 | HR 96 | Temp 98.6°F | Ht 75.0 in | Wt >= 6400 oz

## 2012-03-26 DIAGNOSIS — N419 Inflammatory disease of prostate, unspecified: Secondary | ICD-10-CM

## 2012-03-26 NOTE — Progress Notes (Signed)
Chief complaint: #1 followup lap band and morbid obesity #2 rectal drainage  History: Patient returns to the office for 2 separate issues. First he is interested in trying to work with his lap band. He is status post APL band placement in 2009. He has had no real success with his band. He has had intermittent symptoms of alternating under and over restriction that have been very difficult to sort out. He had an endoscopy last saw him about a year ago that showed no real significant tightness at the band. He however states that he is usually unable to tolerate solid food and therefore has resorted to sweet liquids and ice cream. 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 He therefore has been essentially binging on ice cream and sweet liquids. He feels if he could tolerate a higher volume he can get back to solid foods at like to try to work with the band. We had previously discussed the possibility of gastric bypass but he did not follow through with this and does not want to do this.  Secondly he states he was asked to come here by urology. He is followed for prostatitis which has been recurrent. He had several days last week of increasing suprapubic and perineal pressure discomfort which is typical for his flareups of prostatitis. He also had difficulty voiding. He then about 2 days ago had spontaneous drainage of bloody purulent material from his rectum. This occurred again yesterday with significant relief of his pressure discomfort. He has no real perirectal or anal pain. This is all more anterior. He has a history of anal fissure but not abscessed.  Exam: BP 140/90  Pulse 96  Temp(Src) 98.6 F (37 C) (Oral)  Ht 6\' 3"  (1.905 m)  Wt 467 lb 3.2 oz (211.921 kg)  BMI 58.40 kg/m2  SpO2 98% Total weight loss zero Gen. an obese in no acute distress Abdomen: Soft and nontender. Port site looks fine. Rectal on  careful external rectal exam there is no evidence of any fistulous opening or perianal or perirectal swelling or erythema or induration or any tenderness. On digital rectal exam his prostate is somewhat tender. There is no mass.  Assessment and plan: #1 morbid obesity status post lap band. He has had literally no success today. His symptoms do sound like maladaptive eating and I recommended we removed all the fluid was band to see if he is able to eat healthy solid food. 11 cc was removed. I told him for 4 weeks to work on totally eliminating any sweet liquids and to eat as much solid healthy high protein food as he wants. He states he would contact the dietitian for refresher regarding lap band. He returned 4 weeks to consider a slope and refill.  #2 rectal discharge/drainage. I see no evidence on physical exam or by history that he has a perirectal abscess responsible for his drainage. I suspect he may have had a prostatic abscess that spontaneously drained. If he has ongoing symptoms would consider CT scan or MRI if his weight would not prohibitive scan but I clinically strongly doubt any rectal problems.

## 2012-04-21 ENCOUNTER — Encounter (INDEPENDENT_AMBULATORY_CARE_PROVIDER_SITE_OTHER): Payer: Self-pay | Admitting: General Surgery

## 2012-04-23 ENCOUNTER — Encounter (INDEPENDENT_AMBULATORY_CARE_PROVIDER_SITE_OTHER): Payer: BC Managed Care – PPO

## 2012-04-28 ENCOUNTER — Encounter (INDEPENDENT_AMBULATORY_CARE_PROVIDER_SITE_OTHER): Payer: Self-pay | Admitting: Physician Assistant

## 2012-06-25 ENCOUNTER — Encounter (INDEPENDENT_AMBULATORY_CARE_PROVIDER_SITE_OTHER): Payer: BC Managed Care – PPO

## 2013-03-27 ENCOUNTER — Emergency Department (HOSPITAL_COMMUNITY)
Admission: EM | Admit: 2013-03-27 | Discharge: 2013-03-27 | Disposition: A | Discharge status: Home / Self Care | Payer: BC Managed Care – PPO | Attending: Emergency Medicine | Admitting: Emergency Medicine

## 2013-03-27 ENCOUNTER — Encounter (HOSPITAL_COMMUNITY): Payer: Self-pay | Admitting: Emergency Medicine

## 2013-03-27 DIAGNOSIS — N411 Chronic prostatitis: Secondary | ICD-10-CM | POA: Insufficient documentation

## 2013-03-27 DIAGNOSIS — R109 Unspecified abdominal pain: Secondary | ICD-10-CM | POA: Insufficient documentation

## 2013-03-27 DIAGNOSIS — Z87442 Personal history of urinary calculi: Secondary | ICD-10-CM | POA: Insufficient documentation

## 2013-03-27 DIAGNOSIS — R197 Diarrhea, unspecified: Secondary | ICD-10-CM | POA: Insufficient documentation

## 2013-03-27 DIAGNOSIS — H53149 Visual discomfort, unspecified: Secondary | ICD-10-CM | POA: Insufficient documentation

## 2013-03-27 DIAGNOSIS — B349 Viral infection, unspecified: Secondary | ICD-10-CM

## 2013-03-27 DIAGNOSIS — B9789 Other viral agents as the cause of diseases classified elsewhere: Secondary | ICD-10-CM | POA: Insufficient documentation

## 2013-03-27 DIAGNOSIS — Z8739 Personal history of other diseases of the musculoskeletal system and connective tissue: Secondary | ICD-10-CM | POA: Insufficient documentation

## 2013-03-27 DIAGNOSIS — R52 Pain, unspecified: Secondary | ICD-10-CM | POA: Insufficient documentation

## 2013-03-27 DIAGNOSIS — Z9089 Acquired absence of other organs: Secondary | ICD-10-CM | POA: Insufficient documentation

## 2013-03-27 DIAGNOSIS — R112 Nausea with vomiting, unspecified: Secondary | ICD-10-CM | POA: Insufficient documentation

## 2013-03-27 DIAGNOSIS — R252 Cramp and spasm: Secondary | ICD-10-CM

## 2013-03-27 DIAGNOSIS — Z87448 Personal history of other diseases of urinary system: Secondary | ICD-10-CM | POA: Insufficient documentation

## 2013-03-27 DIAGNOSIS — R748 Abnormal levels of other serum enzymes: Secondary | ICD-10-CM

## 2013-03-27 DIAGNOSIS — Z9889 Other specified postprocedural states: Secondary | ICD-10-CM | POA: Insufficient documentation

## 2013-03-27 DIAGNOSIS — R51 Headache: Secondary | ICD-10-CM | POA: Insufficient documentation

## 2013-03-27 DIAGNOSIS — Z8719 Personal history of other diseases of the digestive system: Secondary | ICD-10-CM | POA: Insufficient documentation

## 2013-03-27 DIAGNOSIS — Z79899 Other long term (current) drug therapy: Secondary | ICD-10-CM | POA: Insufficient documentation

## 2013-03-27 DIAGNOSIS — F329 Major depressive disorder, single episode, unspecified: Secondary | ICD-10-CM | POA: Insufficient documentation

## 2013-03-27 DIAGNOSIS — F411 Generalized anxiety disorder: Secondary | ICD-10-CM | POA: Insufficient documentation

## 2013-03-27 DIAGNOSIS — K5289 Other specified noninfective gastroenteritis and colitis: Secondary | ICD-10-CM | POA: Insufficient documentation

## 2013-03-27 DIAGNOSIS — K529 Noninfective gastroenteritis and colitis, unspecified: Secondary | ICD-10-CM

## 2013-03-27 DIAGNOSIS — F3289 Other specified depressive episodes: Secondary | ICD-10-CM | POA: Insufficient documentation

## 2013-03-27 DIAGNOSIS — Z8669 Personal history of other diseases of the nervous system and sense organs: Secondary | ICD-10-CM | POA: Insufficient documentation

## 2013-03-27 HISTORY — DX: Benign prostatic hyperplasia without lower urinary tract symptoms: N40.0

## 2013-03-27 LAB — URINALYSIS, ROUTINE W REFLEX MICROSCOPIC
Bilirubin Urine: NEGATIVE
Hgb urine dipstick: NEGATIVE
Nitrite: NEGATIVE
Specific Gravity, Urine: 1.02 (ref 1.005–1.030)
Urobilinogen, UA: 0.2 mg/dL (ref 0.0–1.0)
pH: 6 (ref 5.0–8.0)

## 2013-03-27 LAB — COMPREHENSIVE METABOLIC PANEL
ALT: 83 U/L — ABNORMAL HIGH (ref 0–53)
AST: 70 U/L — ABNORMAL HIGH (ref 0–37)
Calcium: 9 mg/dL (ref 8.4–10.5)
Creatinine, Ser: 0.65 mg/dL (ref 0.50–1.35)
GFR calc Af Amer: >90 mL/min (ref >90)
GFR calc non Af Amer: >90 mL/min (ref >90)
Sodium: 136 mEq/L (ref 135–145)
Total Protein: 7.2 g/dL (ref 6.0–8.3)

## 2013-03-27 LAB — CBC WITH DIFFERENTIAL/PLATELET
Basophils Absolute: 0 10*3/uL (ref 0.0–0.1)
Eosinophils Absolute: 0.2 10*3/uL (ref 0.0–0.7)
Eosinophils Relative: 2 % (ref 0–5)
HCT: 40.1 % (ref 39.0–52.0)
MCH: 28.9 pg (ref 26.0–34.0)
MCHC: 35.2 g/dL (ref 30.0–36.0)
MCV: 82.2 fL (ref 78.0–100.0)
Monocytes Absolute: 0.4 10*3/uL (ref 0.1–1.0)
Platelets: 240 10*3/uL (ref 150–400)
RDW: 14.3 % (ref 11.5–15.5)
WBC: 7.9 10*3/uL (ref 4.0–10.5)

## 2013-03-27 MED ORDER — ONDANSETRON HCL 4 MG/2ML IJ SOLN
4.0000 mg | Freq: Once | INTRAMUSCULAR | Status: AC
  Administered 2013-03-27: 4 mg via INTRAVENOUS
  Filled 2013-03-27: qty 2

## 2013-03-27 MED ORDER — HYDROMORPHONE HCL PF 1 MG/ML IJ SOLN
1.0000 mg | Freq: Once | INTRAMUSCULAR | Status: AC
  Administered 2013-03-27: 1 mg via INTRAVENOUS
  Filled 2013-03-27: qty 1

## 2013-03-27 MED ORDER — ONDANSETRON HCL 4 MG PO TABS: 4.0000 mg | ORAL_TABLET | Freq: Four times a day (QID) | ORAL | Status: DC

## 2013-03-27 MED ORDER — CYCLOBENZAPRINE HCL 10 MG PO TABS: 10.0000 mg | ORAL_TABLET | Freq: Two times a day (BID) | ORAL | Status: DC | PRN

## 2013-03-27 MED ORDER — SODIUM CHLORIDE 0.9 % IV BOLUS (SEPSIS)
1000.0000 mL | Freq: Once | INTRAVENOUS | Status: AC
  Administered 2013-03-27: 1000 mL via INTRAVENOUS

## 2013-03-27 NOTE — ED Notes (Signed)
Pt reports generalized body aches and cramps since Tuesday. No relief from pain. Pt states he has tried eating foods high in potassium, but did not help. 8/10 pain at the time. Pt also states nausea and fatigue feeling. He is alert and oriented x4. Able to move all extremities. No active vomiting at the time.

## 2013-03-27 NOTE — ED Notes (Signed)
Pt reports generalized body cramps onset Tuesday. Pt reports Wednesday began with nausea, vomiting, and diarrhea. Pt reports stopped drinking Soda about 2 weeks ago, and has been drinking lots of water. Pt has been eating bananas and drinking orange juice without relief. Diarrhea occurs with the generalized body cramps. Cramping occurs with exertion.

## 2013-03-27 NOTE — ED Provider Notes (Signed)
History     CSN: [id number]  Arrival date & time 03/27/13  7846   First MD Initiated Contact with Patient 03/27/13 [phone]      Chief Complaint  Patient presents with  . Generalized Body Aches  . Nausea  . Diarrhea    (Consider location/radiation/quality/duration/timing/severity/associated sxs/prior treatment) HPI Comments: Angel Pope is a 37 y/o M with PMHx anxiety, low back pain, sleep apnea, overactive bladder, morbidly obese presenting to the ED with generalized body cramping and aches, nausea, diarrhea starting since Tuesday. Patient stated that Monday he was fine, but beginning Tuesday afternoon he noticed cramping "all over body" that has gotten progressively worse over the past couple of days. Patient stated that the cramping lasts approximately 1 minute, sporadic, worse in the legs and abdomen - noticed that these cramping has gotten worse in frequency and intensity. Patient reported that when he is not having the body cramps he is having generalized body aches all over his body. Stated that he has been experiencing episodes of diarrhea starting Thursday - stated that he has at least 7-8 episodes per day - loose, stools without blood or mucus noted. Reported that he is having abdominal pain, but the pain is more musculoskeletal in nature, not actual abdominal pain - reported that the pain is in his "muscles." Stated that he developed a headache yesterday, mainly to the center of the head and around the eyes, described as a constant throbbing sensation with photophobia. Stated that he has been feeling rather nauseous, especially this morning when he was dry-heaving. Denied fever, urinary incontinence, dysuria, hematuria, chills, numbness, tingling, blurred vision, visual changes, chest pain, shortness of breath, difficulty breathing.   The history is provided by the patient and the spouse. No language interpreter was used.    Past Medical History  Diagnosis Date  . Anxiety   .  Pain, low back   . Sleep apnea   . Overactive bladder   . Morbid obesity   . Plantar fasciitis   . Anal fissure   . Ureterolithiasis   . Nausea     chronic- Dr Leone Payor  . Depression     sees Dr. Archer Asa   . Overactive bladder   . Fever of unknown origin     seeing Dr. Enedina Finner   . BPH (benign prostatic hyperplasia)     Past Surgical History  Procedure Laterality Date  . Appendectomy    . Anal sphincterotomy      dr Luisa Hart 06/04/09  . Lab band  06/06/2008    dr Johna Sheriff  . Esophagogastroduodenoscopy      dr Leone Payor - normal    Family History  Problem Relation Age of Onset  . Cancer Father     colon  . Depression Other   . Hypertension Other   . Diabetes Other   . COPD Other     adrenal tumors  . Cystic fibrosis Other     paternall cousins  . Inflammatory bowel disease      paternall aunt    History  Substance Use Topics  . Smoking status: Never Smoker   . Smokeless tobacco: Never Used  . Alcohol Use: No      Review of Systems  Constitutional: Negative for fever, chills and fatigue.  HENT: Negative for ear pain, sore throat, trouble swallowing, neck pain, neck stiffness and tinnitus.   Eyes: Positive for photophobia (associated with headache - baseline for patient). Negative for pain and visual disturbance.  Respiratory: Negative  for cough, chest tightness and shortness of breath.   Cardiovascular: Negative for chest pain.  Gastrointestinal: Positive for nausea, abdominal pain (muscular in nature) and diarrhea. Negative for vomiting, constipation, blood in stool and anal bleeding.  Genitourinary: Negative for dysuria, hematuria, flank pain, decreased urine volume and difficulty urinating.  Neurological: Positive for headaches. Negative for dizziness, speech difficulty, weakness, light-headedness and numbness.  All other systems reviewed and are negative.    Allergies  Acetaminophen; Ciprofloxacin; and Sulfamethoxazole w-trimethoprim  Home  Medications   Current Outpatient Rx  Name  Route  Sig  Dispense  Refill  . aspirin-acetaminophen-caffeine (EXCEDRIN MIGRAINE) 250-250-65 MG per tablet   Oral   Take 1 tablet by mouth every 6 (six) hours as needed for pain.         . clonazePAM (KLONOPIN) 1 MG tablet   Oral   Take 1 mg by mouth 2 (two) times daily as needed for anxiety.          Marland Kitchen HYDROcodone-ibuprofen (VICOPROFEN) 7.5-200 MG per tablet   Oral   Take 1 tablet by mouth every 4 (four) hours as needed.   100 tablet   0   . NIACIN PO   Oral   Take by mouth. Mouth wash         . Potassium Gluconate 595 MG CAPS   Oral   Take 1 capsule by mouth daily as needed. For leg cramps.         . tadalafil (CIALIS) 5 MG tablet   Oral   Take 5 mg by mouth daily as needed.         . traZODone (DESYREL) 100 MG tablet   Oral   Take 100 mg by mouth at bedtime.         Marland Kitchen venlafaxine (EFFEXOR) 75 MG tablet   Oral   Take 375 mg by mouth daily. 5 tabs         . cyclobenzaprine (FLEXERIL) 10 MG tablet   Oral   Take 1 tablet (10 mg total) by mouth 2 (two) times daily as needed for muscle spasms.   15 tablet   0   . ondansetron (ZOFRAN) 4 MG tablet   Oral   Take 1 tablet (4 mg total) by mouth every 6 (six) hours.   12 tablet   0     BP 133/104  Pulse 86  Temp(Src) 97.8 F (36.6 C) (Oral)  Resp 18  SpO2 100%  Physical Exam  Vitals reviewed. Constitutional: He is oriented to person, place, and time. He appears well-developed and well-nourished. No distress.  HENT:  Head: Normocephalic and atraumatic.  Mouth/Throat: Oropharynx is clear and moist. No oropharyngeal exudate.  Mild dry mucus membranes Dry lips   Eyes: Conjunctivae and EOM are normal. Pupils are equal, round, and reactive to light. Right eye exhibits no discharge. Left eye exhibits no discharge.  Neck: Normal range of motion. Neck supple. No tracheal deviation present. No thyromegaly present.  Negative nuchal rigidity Negative neck  stiffness Negative lypmhadenopathy  Cardiovascular: Normal rate, regular rhythm and normal heart sounds.  Exam reveals no friction rub.   No murmur heard. Pulses:      Radial pulses are 2+ on the right side, and 2+ on the left side.  Pulmonary/Chest: Effort normal and breath sounds normal. No respiratory distress. He has no wheezes. He has no rales. He exhibits no tenderness.  Abdominal: Soft. Bowel sounds are normal. He exhibits no distension and no mass. There is tenderness.  There is no rebound and no guarding.  Musculoskeletal: Normal range of motion. He exhibits no edema and no tenderness.  Full ROM to upper and lower extremities bilaterally Strength 5/5+ to upper and lower extremities bilaterally  Lymphadenopathy:    He has no cervical adenopathy.  Neurological: He is alert and oriented to person, place, and time. No cranial nerve deficit. He exhibits normal muscle tone. Coordination normal.  Cranial nerves III-XII grossly intact  Skin: Skin is warm and dry. No rash noted. He is not diaphoretic. No erythema.  Psychiatric: He has a normal mood and affect. His behavior is normal. Thought content normal.    ED Course  Procedures (including critical care time)  Patient placed on IV fluids and Zofran IV given.  11:07AM Patient requesting pain medications to aid in cramping relief - Dilaudid IV given   Labs Reviewed  COMPREHENSIVE METABOLIC PANEL - Abnormal; Notable for the following:    Glucose, Bld 192 (*)    Albumin 3.3 (*)    AST 70 (*)    ALT 83 (*)    All other components within normal limits  URINALYSIS, ROUTINE W REFLEX MICROSCOPIC - Abnormal; Notable for the following:    Glucose, UA 250 (*)    All other components within normal limits  CBC WITH DIFFERENTIAL   No results found.   1. Muscle cramping   2. Viral illness   3. Gastroenteritis   4. Chronic prostatitis   5. Elevated liver enzymes       MDM  Patient afebrile, normotensive, non-tachycardic,  non-tachypneic, adequate saturation on room air.   UA negative findings CBC negative findings CMP negative findings  Negative findings for acute abdomen, negative peritoneal signs. Patient given Zofran, Dilaudid, and IV NS fluids in ED setting - appears to be doing well and discomfort controlled. Discussed lab findings with patient. Calcium within normal limits - ruling out hypocalcemia as being possible suspicion for muscle cramping. Nausea with abdominal discomfort and diarrhea - suspected viral illness, possible gastroenteritis. Etiology of muscle cramping unknown. Discussed case and findings with Dr. Adline Mango- cleared patient for discharge. Discharged patient with muscle relaxers and antiemetics. Referred patient to follow-up with PCP. Discussed with patient to rest, stay hydrated. Discussed BRAT diet with patient to aid in diarrhea. Discussed with patient to get blood glucose checked and liver enzymes to be re-checked due to being elevated. Discussed with patient to monitor symptoms and if symptoms are to worsen or change to report back to the ED. Patient agreed to plan of care, understood, all questions answered.         Raymon Mutton, PA-C 03/27/13 1743

## 2013-03-27 NOTE — ED Provider Notes (Signed)
Medical screening examination/treatment/procedure(s) were conducted as a shared visit with non-physician practitioner(s) and myself.  I personally evaluated the patient during the encounter Morbidly obese man with muscle cramping.  He had had gastric bypass, but that had not resulted in weight loss.  Lab workup showed hyperglyemia.  Need F/U with his PCP with fasting blood sugar to check for diabetes.  Carleene Cooper III, MD 03/27/13 Rosamaria Lints

## 2015-11-05 HISTORY — PX: CERVICAL FUSION: SHX112

## 2016-05-01 ENCOUNTER — Encounter (HOSPITAL_COMMUNITY): Payer: Self-pay | Admitting: Emergency Medicine

## 2016-05-01 ENCOUNTER — Emergency Department (HOSPITAL_COMMUNITY)
Admission: EM | Admit: 2016-05-01 | Discharge: 2016-05-01 | Disposition: A | Discharge status: Home / Self Care | Payer: BC Managed Care – PPO | Attending: Emergency Medicine | Admitting: Emergency Medicine

## 2016-05-01 DIAGNOSIS — R1032 Left lower quadrant pain: Secondary | ICD-10-CM | POA: Diagnosis not present

## 2016-05-01 DIAGNOSIS — R109 Unspecified abdominal pain: Secondary | ICD-10-CM

## 2016-05-01 DIAGNOSIS — Z79899 Other long term (current) drug therapy: Secondary | ICD-10-CM | POA: Insufficient documentation

## 2016-05-01 DIAGNOSIS — Z7982 Long term (current) use of aspirin: Secondary | ICD-10-CM | POA: Insufficient documentation

## 2016-05-01 LAB — URINE MICROSCOPIC-ADD ON

## 2016-05-01 LAB — HEPATIC FUNCTION PANEL
ALBUMIN: 3.4 g/dL — AB (ref 3.5–5.0)
ALK PHOS: 69 U/L (ref 38–126)
ALT: 54 U/L (ref 17–63)
AST: 50 U/L — AB (ref 15–41)
BILIRUBIN TOTAL: 0.7 mg/dL (ref 0.3–1.2)
Bilirubin, Direct: 0.2 mg/dL (ref 0.1–0.5)
Indirect Bilirubin: 0.5 mg/dL (ref 0.3–0.9)
TOTAL PROTEIN: 6.6 g/dL (ref 6.5–8.1)

## 2016-05-01 LAB — URINALYSIS, ROUTINE W REFLEX MICROSCOPIC
Bilirubin Urine: NEGATIVE
Ketones, ur: NEGATIVE mg/dL
LEUKOCYTES UA: NEGATIVE
Nitrite: NEGATIVE
PROTEIN: NEGATIVE mg/dL
Specific Gravity, Urine: 1.041 — ABNORMAL HIGH (ref 1.005–1.030)
pH: 6 (ref 5.0–8.0)

## 2016-05-01 LAB — CBC
HEMATOCRIT: 43.6 % (ref 39.0–52.0)
HEMOGLOBIN: 14.6 g/dL (ref 13.0–17.0)
MCH: 27.9 pg (ref 26.0–34.0)
MCHC: 33.5 g/dL (ref 30.0–36.0)
MCV: 83.4 fL (ref 78.0–100.0)
Platelets: 243 10*3/uL (ref 150–400)
RBC: 5.23 MIL/uL (ref 4.22–5.81)
RDW: 13.8 % (ref 11.5–15.5)
WBC: 8.6 10*3/uL (ref 4.0–10.5)

## 2016-05-01 LAB — BASIC METABOLIC PANEL
Anion gap: 8 (ref 5–15)
BUN: 11 mg/dL (ref 6–20)
CHLORIDE: 103 mmol/L (ref 101–111)
CO2: 26 mmol/L (ref 22–32)
CREATININE: 0.79 mg/dL (ref 0.61–1.24)
Calcium: 9 mg/dL (ref 8.9–10.3)
GFR calc non Af Amer: >60 mL/min (ref >60)
Glucose, Bld: 181 mg/dL — ABNORMAL HIGH (ref 65–99)
POTASSIUM: 4.2 mmol/L (ref 3.5–5.1)
SODIUM: 137 mmol/L (ref 135–145)

## 2016-05-01 LAB — LIPASE, BLOOD: Lipase: 28 U/L (ref 11–51)

## 2016-05-01 MED ORDER — HYDROMORPHONE HCL 1 MG/ML IJ SOLN
1.0000 mg | Freq: Once | INTRAMUSCULAR | Status: AC
  Administered 2016-05-01: 1 mg via INTRAVENOUS
  Filled 2016-05-01: qty 1

## 2016-05-01 MED ORDER — OXYCODONE-IBUPROFEN 5-400 MG PO TABS: 1.0000 | ORAL_TABLET | Freq: Four times a day (QID) | ORAL | Status: DC | PRN

## 2016-05-01 MED ORDER — SODIUM CHLORIDE 0.9 % IV BOLUS (SEPSIS)
1000.0000 mL | Freq: Once | INTRAVENOUS | Status: AC
  Administered 2016-05-01: 1000 mL via INTRAVENOUS

## 2016-05-01 MED ORDER — KETOROLAC TROMETHAMINE 30 MG/ML IJ SOLN
30.0000 mg | Freq: Once | INTRAMUSCULAR | Status: AC
  Administered 2016-05-01: 30 mg via INTRAVENOUS
  Filled 2016-05-01: qty 1

## 2016-05-01 NOTE — ED Notes (Signed)
Pt states left flank pain for 1 day. Pt reports difficulty staring to urinate. Denies any blood in urine. Hx of history stones. Pain 6/10.

## 2016-05-01 NOTE — ED Provider Notes (Signed)
CSN: [id number]     Arrival date & time 05/01/16  1230 History   First MD Initiated Contact with Patient 05/01/16 1611     Chief Complaint  Patient presents with  . Flank Pain    HPI Comments: 40 year old male presents with left sided flank pain for the past day. Past medical history significant for morbid obesity and previous kidney stones and chronic prostatitis, BPH, DM. He states he has never had have surgery to have them removed and normally will just passed some on his own. He is currently on Flomax. Surgical history significant for an appendectomy and lap band surgery. He reports associated low-grade fever, nausea, dysuria, hematuria. Denies chills, chest pain, shortness of breath, abdominal pain, low back pain, vomiting, diarrhea. He states this does not feel like chronic prostatis because he will usually have testicular pain which he denies. He states this feels exactly the same as when he has a kidney stone. Does take oxycodone and oxycontin prn for chronic back pain - states not often.  Patient is a 40 y.o. male presenting with flank pain.  Flank Pain Associated symptoms include a fever and nausea. Pertinent negatives include no abdominal pain, chest pain, chills or vomiting.    Past Medical History  Diagnosis Date  . Anxiety   . Pain, low back   . Sleep apnea   . Overactive bladder   . Morbid obesity (HCC)   . Plantar fasciitis   . Anal fissure   . Ureterolithiasis   . Nausea     chronic- Dr Leone PayorGessner  . Depression     sees Dr. Archer AsaGerald Plovsky   . Overactive bladder   . Fever of unknown origin     seeing Dr. Enedina FinnerJeff Hatcher   . BPH (benign prostatic hyperplasia)    Past Surgical History  Procedure Laterality Date  . Appendectomy    . Anal sphincterotomy      dr Luisa Hartcornett 06/04/09  . Lab band  06/06/2008    dr Johna Sheriffhoxworth  . Esophagogastroduodenoscopy      dr Leone Payorgessner - normal   Family History  Problem Relation Age of Onset  . Cancer Father     colon  . Depression Other   .  Hypertension Other   . Diabetes Other   . COPD Other     adrenal tumors  . Cystic fibrosis Other     paternall cousins  . Inflammatory bowel disease      paternall aunt   Social History  Substance Use Topics  . Smoking status: Never Smoker   . Smokeless tobacco: Never Used  . Alcohol Use: No    Review of Systems  Constitutional: Positive for fever and appetite change. Negative for chills.  Respiratory: Negative for shortness of breath.   Cardiovascular: Negative for chest pain.  Gastrointestinal: Positive for nausea. Negative for vomiting, abdominal pain and diarrhea.  Genitourinary: Positive for dysuria, hematuria and flank pain. Negative for testicular pain.  All other systems reviewed and are negative.     Allergies  Acetaminophen; Ciprofloxacin; and Sulfamethoxazole-trimethoprim  Home Medications   Prior to Admission medications   Medication Sig Start Date End Date Taking? Authorizing Provider  aspirin-acetaminophen-caffeine (EXCEDRIN MIGRAINE) [phone] MG per tablet Take 1 tablet by mouth every 6 (six) hours as needed for pain.    Historical Provider, MD  clonazePAM (KLONOPIN) 1 MG tablet Take 1 mg by mouth 2 (two) times daily as needed for anxiety.     Historical Provider, MD  cyclobenzaprine (FLEXERIL) 10  MG tablet Take 1 tablet (10 mg total) by mouth 2 (two) times daily as needed for muscle spasms. 03/27/13   Marissa Sciacca, PA-C  HYDROcodone-ibuprofen (VICOPROFEN) 7.5-200 MG per tablet Take 1 tablet by mouth every 4 (four) hours as needed. 02/22/11   Nelwyn SalisburyStephen A Fry, MD  NIACIN PO Take by mouth. Mouth wash    Historical Provider, MD  ondansetron (ZOFRAN) 4 MG tablet Take 1 tablet (4 mg total) by mouth every 6 (six) hours. 03/27/13   Marissa Sciacca, PA-C  Potassium Gluconate 595 MG CAPS Take 1 capsule by mouth daily as needed. For leg cramps.    Historical Provider, MD  tadalafil (CIALIS) 5 MG tablet Take 5 mg by mouth daily as needed.    Historical Provider, MD   traZODone (DESYREL) 100 MG tablet Take 100 mg by mouth at bedtime.    Historical Provider, MD  venlafaxine (EFFEXOR) 75 MG tablet Take 375 mg by mouth daily. 5 tabs    Historical Provider, MD   BP 156/90 mmHg  Pulse 89  Temp(Src) 98.7 F (37.1 C) (Oral)  Resp 18  Ht 5\' 8"  (1.727 m)  Wt 219.995 kg  BMI 73.76 kg/m2  SpO2 98%   Physical Exam  Constitutional: He is oriented to person, place, and time. He appears well-developed and well-nourished. No distress.  Morbidly obese  HENT:  Head: Normocephalic and atraumatic.  Eyes: Conjunctivae are normal. Pupils are equal, round, and reactive to light. Right eye exhibits no discharge. Left eye exhibits no discharge. No scleral icterus.  Neck: Normal range of motion.  Cardiovascular: Normal rate and regular rhythm.  Exam reveals no gallop and no friction rub.   No murmur heard. Pulmonary/Chest: Effort normal and breath sounds normal. No respiratory distress. He has no wheezes. He has no rales. He exhibits no tenderness.  Abdominal: Soft. Bowel sounds are normal. He exhibits no distension and no mass. There is tenderness. There is CVA tenderness. There is no rebound and no guarding.  Difficult exam due to body habitus. Ecchymosis on over RLQ. L CVA tenderness. LLQ tenderness  Neurological: He is alert and oriented to person, place, and time.  Skin: Skin is warm and dry.  Psychiatric: He has a normal mood and affect. His behavior is normal. Judgment and thought content normal.    ED Course  Procedures (including critical care time) Labs Review Labs Reviewed  URINALYSIS, ROUTINE W REFLEX MICROSCOPIC (NOT AT [facility]) - Abnormal; Notable for the following:    Color, Urine AMBER (*)    APPearance CLOUDY (*)    Specific Gravity, Urine 1.041 (*)    Glucose, UA >1000 (*)    Hgb urine dipstick LARGE (*)    All other components within normal limits  BASIC METABOLIC PANEL - Abnormal; Notable for the following:    Glucose, Bld 181 (*)    All other  components within normal limits  HEPATIC FUNCTION PANEL - Abnormal; Notable for the following:    Albumin 3.4 (*)    AST 50 (*)    All other components within normal limits  URINE MICROSCOPIC-ADD ON - Abnormal; Notable for the following:    Squamous Epithelial / LPF 0-5 (*)    Bacteria, UA FEW (*)    All other components within normal limits  CBC  LIPASE, BLOOD    Imaging Review No results found. I have personally reviewed and evaluated these images and lab results as part of my medical decision-making.   EKG Interpretation None  MDM   Final diagnoses:  Left flank pain   41 year old male presents with symptoms consistent with a kidney stone. Patient is afebrile, not tachycardic or tachypneic, and not hypoxic. He is hypertensive at times. He states his symptoms are exactly the same as when he has stones in the past therefore will not obtain CT renal at this time. Patient has normal kidney function. UA clean. He is dehydrated - IVF given.. Pain controlled with Dilaudid x 2 and Toradol. Already on Flomax. Urology f/u and pain medicine given. Patient is NAD, non-toxic, with stable VS. Patient is informed of clinical course, understands medical decision making process, and agrees with plan. Opportunity for questions provided and all questions answered. Return precautions given.     Bethel Born, PA-C 05/02/16 9528  Gwyneth Sprout, MD 05/03/16 [phone]

## 2017-01-07 ENCOUNTER — Encounter (HOSPITAL_COMMUNITY): Payer: Self-pay

## 2017-10-29 ENCOUNTER — Emergency Department (HOSPITAL_COMMUNITY): Payer: BC Managed Care – PPO

## 2017-10-29 ENCOUNTER — Encounter (HOSPITAL_COMMUNITY): Payer: Self-pay | Admitting: Emergency Medicine

## 2017-10-29 ENCOUNTER — Emergency Department (HOSPITAL_COMMUNITY)
Admission: EM | Admit: 2017-10-29 | Discharge: 2017-10-30 | Disposition: A | Discharge status: Home / Self Care | Payer: BC Managed Care – PPO | Attending: Emergency Medicine | Admitting: Emergency Medicine

## 2017-10-29 DIAGNOSIS — R32 Unspecified urinary incontinence: Secondary | ICD-10-CM | POA: Insufficient documentation

## 2017-10-29 DIAGNOSIS — Z981 Arthrodesis status: Secondary | ICD-10-CM | POA: Insufficient documentation

## 2017-10-29 DIAGNOSIS — Z79899 Other long term (current) drug therapy: Secondary | ICD-10-CM | POA: Diagnosis not present

## 2017-10-29 DIAGNOSIS — G8929 Other chronic pain: Secondary | ICD-10-CM | POA: Insufficient documentation

## 2017-10-29 DIAGNOSIS — M549 Dorsalgia, unspecified: Secondary | ICD-10-CM | POA: Diagnosis present

## 2017-10-29 DIAGNOSIS — Z7982 Long term (current) use of aspirin: Secondary | ICD-10-CM | POA: Diagnosis not present

## 2017-10-29 DIAGNOSIS — Z794 Long term (current) use of insulin: Secondary | ICD-10-CM | POA: Diagnosis not present

## 2017-10-29 MED ORDER — LORAZEPAM 2 MG/ML IJ SOLN
1.0000 mg | Freq: Once | INTRAMUSCULAR | Status: AC
  Administered 2017-10-29: 1 mg via INTRAVENOUS
  Filled 2017-10-29: qty 1

## 2017-10-29 MED ORDER — HYDROMORPHONE HCL 1 MG/ML IJ SOLN
1.0000 mg | Freq: Once | INTRAMUSCULAR | Status: AC
  Administered 2017-10-29: 1 mg via INTRAVENOUS
  Filled 2017-10-29: qty 1

## 2017-10-29 NOTE — ED Notes (Signed)
Patient transported to MRI 

## 2017-10-29 NOTE — ED Notes (Signed)
Pt remains over in MRI

## 2017-10-29 NOTE — ED Triage Notes (Signed)
Pt to ER for upper midline thoracic back pain that has progressively gotten worse over the last 2 weeks. Tender on palpation. States takes oxycodone at home and is unable to relieve the pain. Pt reports over the last 3-4 days has had 3 incontinent episodes and states his legs become so weak he falls. Pt appears uncomfortable in triage. Denies recent injury. States this happened when his disc collapsed in his back previously.

## 2017-10-29 NOTE — ED Provider Notes (Signed)
[facility] EMERGENCY DEPARTMENT Provider Note   CSN: [id number] Arrival date & time: 10/29/17  1746     History   Chief Complaint Chief Complaint  Patient presents with  . Back Pain    HPI Angel Pope is a 41 y.o. male.  HPI 41 year old male with a history of prior cervical spine fusion who presents to the emergency department with 3-4 days of progressive back pain and feeling like his legs are weak, right greater than left.  He reports 3 episodes of incontinence.  He denies perineal symptoms.  He reports upper back discomfort.  His orthopedic spine team is in [facility].  He has had this pain for several months but feels like it is worsening.  He last had an MRI approximately 1 year ago.  He states she is never quite been the same since surgery but reports his symptoms that bring him to the ER today are new and somewhat progressive.  No fevers or chills.  No chest pain or abdominal pain.  He denies low back pain. Pain is moderate in severity   Past Medical History:  Diagnosis Date  . Anal fissure   . Anxiety   . BPH (benign prostatic hyperplasia)   . Depression    sees Dr. Archer Asa   . Fever of unknown origin    seeing Dr. Enedina Finner   . Morbid obesity (HCC)   . Nausea    chronic- Dr Leone Payor  . Overactive bladder   . Overactive bladder   . Pain, low back   . Plantar fasciitis   . Sleep apnea   . Ureterolithiasis     Patient Active Problem List   Diagnosis Date Noted  . Morbid obesity (HCC) 03/26/2012  . CHRONIC PROSTATITIS 01/08/2011  . FEVER UNSPECIFIED 01/08/2011  . ABDOMINAL PAIN RIGHT UPPER QUADRANT 05/15/2010  . DIARRHEA-PRESUMED INFECTIOUS 07/31/2009  . ANAL FISSURE 07/31/2009  . HEMATEMESIS 07/31/2009  . NAUSEA AND VOMITING 07/31/2009  . VOMITING 07/31/2009  . DYSPHAGIA UNSPECIFIED 07/31/2009  . DYSPHAGIA 07/31/2009  . GERD 07/25/2009  . HEADACHE 07/19/2009  . HEMORRHOID, THROMBOSED 05/15/2009  .  OVERACTIVE BLADDER 12/15/2007  . ANXIETY 08/03/2007  . DEPRESSION 08/03/2007  . LOW BACK PAIN 08/03/2007  . SLEEP APNEA 08/03/2007    Past Surgical History:  Procedure Laterality Date  . ANAL SPHINCTEROTOMY     dr Luisa Hart 06/04/09  . APPENDECTOMY    . ESOPHAGOGASTRODUODENOSCOPY     dr Leone Payor - normal  . lab band  06/06/2008   dr Johna Sheriff       Home Medications    Prior to Admission medications   Medication Sig Start Date End Date Taking? Authorizing Provider  aspirin 325 MG EC tablet Take 325 mg by mouth every 6 (six) hours as needed for pain.   Yes [provider]  gabapentin (NEURONTIN) 300 MG capsule Take 300 mg by mouth 3 (three) times daily as needed for pain. 10/06/17  Yes [provider]  hydrOXYzine (ATARAX/VISTARIL) 10 MG tablet Take 10 mg by mouth 3 (three) times daily. 10/01/17  Yes [provider]  ibuprofen (ADVIL,MOTRIN) 200 MG tablet Take 200 mg by mouth every 6 (six) hours as needed for moderate pain.   Yes [provider]  insulin aspart (NOVOLOG) 100 UNIT/ML injection Inject 20-32 Units into the skin 3 (three) times daily before meals. Sliding scale   Yes [provider]  JARDIANCE 10 MG TABS tablet Take 10 mg by mouth daily.  05/01/16  Yes [provider]  Liraglutide (VICTOZA) 18 MG/3ML SOPN Inject 1.8 mg into the skin daily.   Yes [provider]  metFORMIN (GLUCOPHAGE) 1000 MG tablet Take 1,000 mg by mouth 2 (two) times daily with a meal.   Yes [provider]  Oxycodone HCl 10 MG TABS Take 10 mg by mouth 3 (three) times daily.  04/07/16  Yes [provider]  OXYCONTIN 40 MG 12 hr tablet Take 40 mg by mouth every 12 (twelve) hours.  05/01/16  Yes [provider]  QUEtiapine (SEROQUEL) 50 MG tablet Take 100 mg by mouth at bedtime. 09/29/17  Yes [provider]  tamsulosin (FLOMAX) 0.4 MG CAPS capsule Take 0.4 mg by mouth daily after breakfast.    Yes [provider]  TRESIBA FLEXTOUCH 200 UNIT/ML SOPN Inject 64 Units into the skin daily. 10/03/17  Yes [provider]  venlafaxine XR (EFFEXOR-XR) 75 MG 24 hr capsule Take 225 mg by mouth at bedtime. 04/19/16  Yes [provider]  cyclobenzaprine (FLEXERIL) 10 MG tablet Take 1 tablet (10 mg total) by mouth 2 (two) times daily as needed for muscle spasms. Patient not taking: Reported on 10/29/2017 03/27/13   Sciacca, Marissa, PA-C  HYDROcodone-ibuprofen (VICOPROFEN) 7.5-200 MG per tablet Take 1 tablet by mouth every 4 (four) hours as needed. Patient not taking: Reported on 10/29/2017 02/22/11   Nelwyn SalisburyFry, Stephen A, MD  ondansetron (ZOFRAN) 4 MG tablet Take 1 tablet (4 mg total) by mouth every 6 (six) hours. Patient not taking: Reported on 10/29/2017 03/27/13   Sciacca, Ashok CordiaMarissa, PA-C  oxycodone-ibuprofen (COMBUNOX) 5-400 MG tablet Take 1 tablet by mouth every 6 (six) hours as needed for pain. Patient not taking: Reported on 10/29/2017 05/01/16   Bethel BornGekas, Kelly Marie, PA-C    Family History Family History  Problem Relation Age of Onset  . Cancer Father        colon  . Depression Other   . Hypertension Other   . Diabetes Other   . COPD Other        adrenal tumors  . Cystic fibrosis Other        paternall cousins  . Inflammatory bowel disease Unknown        paternall aunt    Social History Social History   Tobacco Use  . Smoking status: Never Smoker  . Smokeless tobacco: Never Used  Substance Use Topics  . Alcohol use: No  . Drug use: No     Allergies   Ciprofloxacin; Acetaminophen; and Sulfamethoxazole-trimethoprim   Review of Systems Review of Systems  All other systems reviewed and are negative.    Physical Exam Updated Vital Signs BP 118/62 (BP Location: Right Arm)   Pulse 78   Temp 98.3 F (36.8 C) (Oral)   Resp 18   SpO2 98%   Physical Exam  Constitutional: He is oriented to person, place, and time. He appears well-developed and well-nourished.  HENT:  Head:  Normocephalic and atraumatic.  Eyes: EOM are normal.  Neck: Normal range of motion.  Cardiovascular: Normal rate and regular rhythm.  Pulmonary/Chest: Effort normal and breath sounds normal.  Abdominal: Soft. He exhibits no distension. There is no tenderness.  Musculoskeletal: Normal range of motion.  Upper thoracic and lower cervical tenderness without obvious step-off.  No lumbar tenderness.  Full range of motion bilateral hips, knees, ankles.5-/5 strength of right lower extremity major muscle groups. 5/5 strength of left lower extremity major muscle groups  Neurological: He is alert  and oriented to person, place, and time.  Skin: Skin is warm and dry.  Psychiatric: He has a normal mood and affect. Judgment normal.  Nursing note and vitals reviewed.    ED Treatments / Results  Labs (all labs ordered are listed, but only abnormal results are displayed) Labs Reviewed - No data to display  EKG  EKG Interpretation None       Radiology Ct Head Wo Contrast  Result Date: 10/29/2017 CLINICAL DATA:  Multiple recent falls, leg weakness. EXAM: CT HEAD WITHOUT CONTRAST CT CERVICAL SPINE WITHOUT CONTRAST TECHNIQUE: Multidetector CT imaging of the head and cervical spine was performed following the standard protocol without intravenous contrast. Multiplanar CT image reconstructions of the cervical spine were also generated. COMPARISON:  None. FINDINGS: CT HEAD FINDINGS BRAIN: The ventricles and sulci are normal. 5 mm rounded density at foramen of Monro (axial 21/36). No intraparenchymal hemorrhage, mass effect nor midline shift. No acute large vascular territory infarcts. No abnormal extra-axial fluid collections. Basal cisterns are patent. VASCULAR: Unremarkable. SKULL/SOFT TISSUES: No skull fracture. No significant soft tissue swelling. ORBITS/SINUSES: The included ocular globes and orbital contents are normal.The mastoid air-cells and included paranasal sinuses are well-aerated. OTHER: None. CT  CERVICAL SPINE FINDINGS- Large body habitus results in overall noisy image quality. ALIGNMENT: Vertebral bodies in alignment. Maintained lordosis. SKULL BASE AND VERTEBRAE: Cervical vertebral bodies and posterior elements are intact. C5-6 ACDF with arthrodesis. Non surgically altered disc heights maintained. No destructive bony lesions. C1-2 articulation maintained mild arthropathy. SOFT TISSUES AND SPINAL CANAL: Normal. DISC LEVELS: Uncovertebral hypertrophy/disc osteophyte complex resulting in severe RIGHT C3-4 neural foraminal narrowing. At least mild canal stenosis at C5-6. UPPER CHEST: Lung apices are clear. OTHER: None. IMPRESSION: CT HEAD: 1. No acute intracranial process. 2. 5 mm colloid cyst, otherwise negative noncontrast CT HEAD. CT CERVICAL SPINE: 1. Habitus limited examination without fracture or malalignment. 2. C5-6 ACDF and arthrodesis. 3. Mild canal stenosis C5-6. Severe RIGHT C3-4 neural foraminal narrowing. Electronically Signed   By: Awilda Metro M.D.   On: 10/29/2017 19:19   Ct Cervical Spine Wo Contrast  Result Date: 10/29/2017 CLINICAL DATA:  Multiple recent falls, leg weakness. EXAM: CT HEAD WITHOUT CONTRAST CT CERVICAL SPINE WITHOUT CONTRAST TECHNIQUE: Multidetector CT imaging of the head and cervical spine was performed following the standard protocol without intravenous contrast. Multiplanar CT image reconstructions of the cervical spine were also generated. COMPARISON:  None. FINDINGS: CT HEAD FINDINGS BRAIN: The ventricles and sulci are normal. 5 mm rounded density at foramen of Monro (axial 21/36). No intraparenchymal hemorrhage, mass effect nor midline shift. No acute large vascular territory infarcts. No abnormal extra-axial fluid collections. Basal cisterns are patent. VASCULAR: Unremarkable. SKULL/SOFT TISSUES: No skull fracture. No significant soft tissue swelling. ORBITS/SINUSES: The included ocular globes and orbital contents are normal.The mastoid air-cells and  included paranasal sinuses are well-aerated. OTHER: None. CT CERVICAL SPINE FINDINGS- Large body habitus results in overall noisy image quality. ALIGNMENT: Vertebral bodies in alignment. Maintained lordosis. SKULL BASE AND VERTEBRAE: Cervical vertebral bodies and posterior elements are intact. C5-6 ACDF with arthrodesis. Non surgically altered disc heights maintained. No destructive bony lesions. C1-2 articulation maintained mild arthropathy. SOFT TISSUES AND SPINAL CANAL: Normal. DISC LEVELS: Uncovertebral hypertrophy/disc osteophyte complex resulting in severe RIGHT C3-4 neural foraminal narrowing. At least mild canal stenosis at C5-6. UPPER CHEST: Lung apices are clear. OTHER: None. IMPRESSION: CT HEAD: 1. No acute intracranial process. 2. 5 mm colloid cyst, otherwise negative noncontrast CT HEAD. CT CERVICAL SPINE: 1. Habitus  limited examination without fracture or malalignment. 2. C5-6 ACDF and arthrodesis. 3. Mild canal stenosis C5-6. Severe RIGHT C3-4 neural foraminal narrowing. Electronically Signed   By: Awilda Metroourtnay  Bloomer M.D.   On: 10/29/2017 19:19   Ct Thoracic Spine Wo Contrast  Result Date: 10/29/2017 CLINICAL DATA:  Upper thoracic spine pain. Multiple recent falls, leg weakness. EXAM: CT THORACIC SPINE WITHOUT CONTRAST TECHNIQUE: Multidetector CT images of the thoracic were obtained using the standard protocol without intravenous contrast. COMPARISON:  Chest radiograph December 10, 2009 FINDINGS: Large body habitus results in overall noisy image quality. ALIGNMENT: Maintained thoracic lordosis. No malalignment. VERTEBRAE: Vertebral bodies and posterior elements are intact. Intervertebral disc heights preserved, multilevel mild ventral endplate spurring. Multilevel mild thoracic facet arthropathy. No destructive bony lesions. PARASPINAL AND OTHER SOFT TISSUES: Included prevertebral and paraspinal soft tissues are nonacute. Multilevel supraspinatus calcification. DISC LEVELS: No osseous canal  stenosis. Severe LEFT T8-9 neural foraminal narrowing due to small LEFT subarticular disc osteophyte complex and arthropathy. IMPRESSION: 1. No acute fracture or malalignment. 2. No osseous canal stenosis. Severe LEFT T8-9 neural foraminal narrowing. Electronically Signed   By: Awilda Metroourtnay  Bloomer M.D.   On: 10/29/2017 19:08    Procedures Procedures (including critical care time)  Medications Ordered in ED Medications  HYDROmorphone (DILAUDID) injection 1 mg (1 mg Intravenous Given 10/29/17 2000)  LORazepam (ATIVAN) injection 1 mg (1 mg Intravenous Given 10/29/17 2141)     Initial Impression / Assessment and Plan / ED Course  I have reviewed the triage vital signs and the nursing notes.  Pertinent labs & imaging results that were available during my care of the patient were reviewed by me and considered in my medical decision making (see chart for details).     MRI pending. No lower back pain.  All of his pain is more localized to the upper thoracic and lower cervical region. Pain control now.  [facility] results.  Hopefully home with images burned on disc to follow up [place] in [place].   Final Clinical Impressions(s) / ED Diagnoses   Final diagnoses:  None    ED Discharge Orders    None       Azalia Bilisampos, Toshia Larkin, MD 10/29/17 [phone]

## 2017-10-29 NOTE — ED Notes (Signed)
Pt has returned from CT.  

## 2017-10-29 NOTE — ED Notes (Signed)
ED Provider at bedside. 

## 2017-10-30 ENCOUNTER — Emergency Department (HOSPITAL_COMMUNITY): Payer: BC Managed Care – PPO

## 2017-10-30 MED ORDER — METHOCARBAMOL 1000 MG/10ML IJ SOLN: 1000.0000 mg | Freq: Once | INTRAMUSCULAR | Status: DC

## 2017-10-30 MED ORDER — HYDROMORPHONE HCL 1 MG/ML IJ SOLN
1.0000 mg | INTRAMUSCULAR | Status: AC | PRN
  Administered 2017-10-30 (×2): 1 mg via INTRAVENOUS
  Filled 2017-10-30 (×2): qty 1

## 2017-10-30 MED ORDER — METHOCARBAMOL 1000 MG/10ML IJ SOLN
1000.0000 mg | Freq: Once | INTRAVENOUS | Status: AC
  Administered 2017-10-30: 1000 mg via INTRAVENOUS
  Filled 2017-10-30: qty 600

## 2017-10-30 MED ORDER — NAPROXEN 500 MG PO TABS: 500.0000 mg | ORAL_TABLET | Freq: Two times a day (BID) | ORAL | 0 refills | Status: DC

## 2017-10-30 MED ORDER — LORAZEPAM 2 MG/ML IJ SOLN
1.0000 mg | Freq: Once | INTRAMUSCULAR | Status: AC
  Administered 2017-10-30: 1 mg via INTRAVENOUS
  Filled 2017-10-30: qty 1

## 2017-10-30 MED ORDER — KETOROLAC TROMETHAMINE 30 MG/ML IJ SOLN
30.0000 mg | Freq: Once | INTRAMUSCULAR | Status: AC
  Administered 2017-10-30: 30 mg via INTRAVENOUS
  Filled 2017-10-30: qty 1

## 2017-10-30 MED ORDER — DEXAMETHASONE SODIUM PHOSPHATE 10 MG/ML IJ SOLN
10.0000 mg | Freq: Once | INTRAMUSCULAR | Status: AC
  Administered 2017-10-30: 10 mg via INTRAVENOUS
  Filled 2017-10-30: qty 1

## 2017-10-30 MED ORDER — LIDOCAINE 5 % EX PTCH: 1.0000 | MEDICATED_PATCH | CUTANEOUS | 0 refills | Status: DC

## 2017-10-30 NOTE — ED Notes (Signed)
Patient ambulated without difficulty. 

## 2017-10-30 NOTE — Discharge Instructions (Signed)
You were seen in the emergency department for upper back pain, weakness in your legs, and loss of control of your bladder.  You had extensive imaging of your neck and back in the emergency department.  We have given you a CD of these images to provide to your neurosurgeon.  I have also provided copies of the reports of these images in your discharge paperwork.  The imaging that we obtained did show a bulging disc in your neck and a bulging disc in your back.  These disc however do not necessarily explain your symptoms.  We have prescribed you naproxen and Lidoderm patches for additional pain control. -Naproxen is a nonsteroidal anti-inflammatory medicine.  You need to take this with food, as it can cause stomach upset and at worst stomach bleeding.  You can take this once every 12 hours as needed for pain.  Do Not take other nonsteroidal anti-inflammatory medicine such as ibuprofen, Motrin, Aleve, or Advil as this will further irritate your stomach. -Lidoderm patches- these are topical patches to help with pain, you may apply 1 patch daily as needed for pain in the area you are having pain. If these are expensive at the pharmacy ask the pharmacist with the over-the-counter version of.  You need to follow-up with your neurosurgeon, please call tomorrow to try to make an appointment within the next few days.  If unable to make an appointment make a follow-up appointment with your primary care provider for reevaluation in the next 3 days.  Return to the emergency department for any new or worsening symptoms including but not limited to numbness in your groin area or legs/arms, inability to walk, fever, or increase in pain.

## 2017-10-30 NOTE — ED Provider Notes (Signed)
06:00: Assumed care of patient following sign out from [facility] Humes PA-C.   Patient is a 41 year old male with a hx of prior C5/6 fusion who presented to the ED complaining of upper back pain and feeling of bilateral LE weakness, R > L for the past 3-4 days. Pain is more specifically described as being to the base of the neck to lower shoulder blade area. Reported 3 episodes of urinary incontinence. [place] Denies saddle anesthesia.   Patient initially with MRI of C-spine- no evidence of severe cord compression in Cspine or Tspine, there is left subarticular disc protrusion with left anterior cord contact at C6/7. [facility] with continued LE weakness and incontinence- patient reports episode of incontinence in the MRI today. MRI of Lumbar Spine ordered.   Patient signed out to me pending MRI results- if no cauda equina patient is anticipated to go home with neurosurgery follow up.   Lumbar MRI:  CLINICAL DATA: 41 y/o M; 3-4 days of progressive back pain in  feeling of leg weakness greater on the right. 3 episodes of  incontinence.    EXAM:  MRI LUMBAR SPINE WITHOUT CONTRAST    TECHNIQUE:  Multiplanar, multisequence MR imaging of the lumbar spine was  performed. No intravenous contrast was administered.    COMPARISON: 02/14/2011 lumbar spine MRI.    FINDINGS:  Segmentation: Standard.    Alignment: Physiologic.    Vertebrae: No fracture, evidence of discitis, or bone lesion.    Conus medullaris and cauda equina: Conus extends to the L1 level.  Stable linear T1 hyperintense focus with adjacent linear low signal  focus within posterior cauda equina at the L2 level, likely fatty  filum. Conus and cauda equina appear otherwise normal.    Paraspinal and other soft tissues: Negative.    Disc levels:    L1-2: No significant disc displacement, foraminal stenosis, or canal  stenosis.    L2-3: No  significant disc displacement, foraminal stenosis, or canal  stenosis.    L3-4: Small disc bulge with mild facet hypertrophy. Mild bilateral  foraminal stenosis. Trace right facet effusion. No canal stenosis.    L4-5: Small disc bulge eccentric to the left and small left central  disc protrusion. Mild left foraminal and lateral recess stenosis. No  significant canal stenosis.    L5-S1: 7 mm central and subarticular disc protrusion greater on the  left with impingement of bilateral descending S1 nerve roots in the  lateral recesses. Mild bilateral foraminal stenosis. Moderate canal  stenosis.    IMPRESSION:  1. No acute osseous abnormality.  2. Progression of lumbar spondylosis at the L3-4 through L5-S1  levels.  3. Multilevel mild foraminal stenosis. No high-grade foraminal  stenosis.  4. Large L5-S1 central and subarticular disc protrusion greater on  the left with impingement of bilateral descending S1 nerve roots in  the lateral recesses and moderate canal stenosis.      Electronically Signed  By: Mitzi HansenLance Furusawa-Stratton M.D.  On: 10/30/2017 06:26   Discussed imaging results with supervising physician Dr. Bebe ShaggyWickline.   Discussed imaging results above with patient and his wife. Discussed the need for neurosurgery follow-up for further evaluation and management. On re-evaluation patient has 5/5 strength with dorsi and plantar flexion bilaterally, sensation is intact to bilateral lower extremities. Patient is able to ambulate in the ED. Will treat with Naproxen and Lidoderm patches, instructed patient to continue taking his Oxycodone and OxyContin as  prescribed. I discussed results, treatment plan, need for neurosurgery follow-up, and return precautions with the patient. Provided opportunity for questions, patient confirmed understanding and is in agreement with plan.     Cherly Andersonetrucelli, Hamzah Savoca R, PA-C 10/30/17 1648    Zadie RhineWickline, Donald, MD 10/31/17 [phone]

## 2017-10-30 NOTE — ED Provider Notes (Signed)
1:00 AM MRI findings reviewed with the patient.  There is no evidence of severe cord compression in the cervical or thoracic spine.  There is left subarticular disc protrusion with left anterior cord contact at C6/7.  It is possible that this may be causing the patient's "searing and burning" pain which originates at his lower neck and travels down to his shoulder blade.  Still, with reported symptoms of lower extremity weakness with incontinence (patient states he had another episode of incontinence while in MRI today) I believe the patient requires complete imaging of his spine to rule out acute pathology.  Will add MR lumbar spine to evaluate cauda equina.  This is not consistent with the area of pain, but MR cervical and thoracic spine do not suggest a clear cause of the patient's weakness and incontinence.  Patient agreeable to stay for additional imaging.  We will continue with pain management in the interim.  5:25 AM Patient pending completion of lumbar MRI.  6:00 AM Patient signed out to oncoming mid-level provider, Harvie HeckSamantha Petrucelli, PA-C at change of shift who will follow up on imaging and disposition appropriately.   Antony MaduraHumes, Nabeeha Badertscher, PA-C 10/30/17 0600    Zadie RhineWickline, Donald, MD 10/30/17 [phone]

## 2017-10-30 NOTE — ED Notes (Signed)
Patient transported to MRI 

## 2018-01-07 ENCOUNTER — Encounter (HOSPITAL_COMMUNITY): Payer: Self-pay

## 2018-08-12 ENCOUNTER — Other Ambulatory Visit: Payer: Self-pay

## 2018-08-12 ENCOUNTER — Encounter (HOSPITAL_COMMUNITY): Payer: Self-pay

## 2018-08-12 ENCOUNTER — Emergency Department (HOSPITAL_COMMUNITY)
Admission: EM | Admit: 2018-08-12 | Discharge: 2018-08-12 | Disposition: A | Discharge status: Home / Self Care | Payer: BC Managed Care – PPO | Attending: Emergency Medicine | Admitting: Emergency Medicine

## 2018-08-12 ENCOUNTER — Emergency Department (HOSPITAL_COMMUNITY): Payer: BC Managed Care – PPO

## 2018-08-12 DIAGNOSIS — Z79899 Other long term (current) drug therapy: Secondary | ICD-10-CM | POA: Insufficient documentation

## 2018-08-12 DIAGNOSIS — M5432 Sciatica, left side: Secondary | ICD-10-CM | POA: Insufficient documentation

## 2018-08-12 DIAGNOSIS — M545 Low back pain: Secondary | ICD-10-CM | POA: Diagnosis present

## 2018-08-12 MED ORDER — KETOROLAC TROMETHAMINE 30 MG/ML IJ SOLN
30.0000 mg | Freq: Once | INTRAMUSCULAR | Status: AC
  Administered 2018-08-12: 30 mg via INTRAVENOUS
  Filled 2018-08-12: qty 1

## 2018-08-12 MED ORDER — HYDROMORPHONE HCL 1 MG/ML IJ SOLN
1.0000 mg | Freq: Once | INTRAMUSCULAR | Status: AC
  Administered 2018-08-12: 1 mg via INTRAVENOUS
  Filled 2018-08-12: qty 1

## 2018-08-12 MED ORDER — CYCLOBENZAPRINE HCL 10 MG PO TABS: 10.0000 mg | ORAL_TABLET | Freq: Three times a day (TID) | ORAL | 0 refills | Status: DC | PRN

## 2018-08-12 MED ORDER — PREDNISONE 20 MG PO TABS: 20.0000 mg | ORAL_TABLET | Freq: Every day | ORAL | 0 refills | Status: DC

## 2018-08-12 MED ORDER — OXYCODONE HCL 5 MG PO TABS: 5.0000 mg | ORAL_TABLET | ORAL | 0 refills | Status: DC | PRN

## 2018-08-12 NOTE — ED Provider Notes (Signed)
[facility] EMERGENCY DEPARTMENT Provider Note   CSN: [id number] Arrival date & time: 08/12/18  1478     History   Chief Complaint Chief Complaint  Patient presents with  . Back Pain    HPI Angel Pope is a 42 y.o. male.  HPI Patient presents to the emergency department with lower back pain that started after he bent over to pick something up.  The patient states that he also rolled over later that evening in bed and felt worsening pain at that time.  The patient states that he did not have any other injuries.  Patient states that nothing seems to make the condition better certain movements and palpation make the pain worse.  Patient states that he did not take any medications prior to arrival for symptoms.  Patient states certain movements and palpation make the pain worse.  The patient denies chest pain, shortness of breath, headache,blurred vision, neck pain, fever, cough, weakness, numbness, dizziness, anorexia, edema, abdominal pain, nausea, vomiting, diarrhea, rash, dysuria, hematemesis, bloody stool, near syncope, or syncope. Past Medical History:  Diagnosis Date  . Anal fissure   . Anxiety   . BPH (benign prostatic hyperplasia)   . Depression    sees Dr. Archer Asa   . Fever of unknown origin    seeing Dr. Enedina Finner   . Morbid obesity (HCC)   . Nausea    chronic- Dr Leone Payor  . Overactive bladder   . Overactive bladder   . Pain, low back   . Plantar fasciitis   . Sleep apnea   . Ureterolithiasis     Patient Active Problem List   Diagnosis Date Noted  . Morbid obesity (HCC) 03/26/2012  . CHRONIC PROSTATITIS 01/08/2011  . FEVER UNSPECIFIED 01/08/2011  . ABDOMINAL PAIN RIGHT UPPER QUADRANT 05/15/2010  . DIARRHEA-PRESUMED INFECTIOUS 07/31/2009  . ANAL FISSURE 07/31/2009  . HEMATEMESIS 07/31/2009  . NAUSEA AND VOMITING 07/31/2009  . VOMITING 07/31/2009  . DYSPHAGIA UNSPECIFIED 07/31/2009  . DYSPHAGIA 07/31/2009  . GERD 07/25/2009   . HEADACHE 07/19/2009  . HEMORRHOID, THROMBOSED 05/15/2009  . OVERACTIVE BLADDER 12/15/2007  . ANXIETY 08/03/2007  . DEPRESSION 08/03/2007  . LOW BACK PAIN 08/03/2007  . SLEEP APNEA 08/03/2007    Past Surgical History:  Procedure Laterality Date  . ANAL SPHINCTEROTOMY     dr Luisa Hart 06/04/09  . APPENDECTOMY    . ESOPHAGOGASTRODUODENOSCOPY     dr Leone Payor - normal  . lab band  06/06/2008   dr Johna Sheriff        Home Medications    Prior to Admission medications   Medication Sig Start Date End Date Taking? Authorizing Provider  FARXIGA 10 MG TABS tablet Take 10 mg by mouth daily. 08/09/18  Yes [provider]  fentaNYL (DURAGESIC - DOSED MCG/HR) 25 MCG/HR patch Place 1 patch onto the skin daily. 08/09/18  Yes [provider]  gabapentin (NEURONTIN) 300 MG capsule Take 600 mg by mouth 3 (three) times daily.  10/06/17  Yes [provider]  glipiZIDE (GLUCOTROL) 5 MG tablet Take 5 mg by mouth daily. 07/31/18  Yes [provider]  hydrOXYzine (ATARAX/VISTARIL) 10 MG tablet Take 10 mg by mouth 3 (three) times daily. 10/01/17  Yes [provider]  ibuprofen (ADVIL,MOTRIN) 200 MG tablet Take 400 mg by mouth every 6 (six) hours as needed for moderate pain.    Yes [provider]  insulin aspart (NOVOLOG) 100 UNIT/ML injection Inject 20-32 Units into the skin 3 (three)  times daily before meals. Sliding scale   Yes [provider]  metFORMIN (GLUCOPHAGE) 1000 MG tablet Take 1,000 mg by mouth 2 (two) times daily with a meal.   Yes [provider]  nortriptyline (PAMELOR) 25 MG capsule Take 25 mg by mouth at bedtime. 08/12/18  Yes [provider]  Oxycodone HCl 10 MG TABS Take 10 mg by mouth every 6 (six) hours as needed (for pain).  04/07/16  Yes [provider]  OZEMPIC, 1 MG/DOSE, 2 MG/1.5ML SOPN Inject 1 mL as directed once a week. 08/09/18  Yes [provider]  QUEtiapine (SEROQUEL) 50 MG tablet Take 100  mg by mouth at bedtime. 09/29/17  Yes [provider]  testosterone cypionate (DEPOTESTOSTERONE CYPIONATE) 200 MG/ML injection Inject 0.5 mLs into the muscle every 14 (fourteen) days. 04/27/18 04/27/19 Yes [provider]  tiZANidine (ZANAFLEX) 2 MG tablet Take 2 mg by mouth every 8 (eight) hours. 08/12/18  Yes [provider]  TRESIBA FLEXTOUCH 200 UNIT/ML SOPN Inject 100 Units into the skin daily.  10/03/17  Yes [provider]  venlafaxine XR (EFFEXOR-XR) 75 MG 24 hr capsule Take 225 mg by mouth at bedtime. 04/19/16  Yes [provider]  lidocaine (LIDODERM) 5 % Place 1 patch onto the skin daily. Remove & Discard patch within 12 hours or as directed by MD Patient not taking: Reported on 08/12/2018 10/30/17   Petrucelli, Lelon Mast R, PA-C  naproxen (NAPROSYN) 500 MG tablet Take 1 tablet (500 mg total) by mouth 2 (two) times daily. Patient not taking: Reported on 08/12/2018 10/30/17   Petrucelli, Pleas Koch, PA-C    Family History Family History  Problem Relation Age of Onset  . Cancer Father        colon  . Depression Other   . Hypertension Other   . Diabetes Other   . COPD Other        adrenal tumors  . Cystic fibrosis Other        paternall cousins  . Inflammatory bowel disease Unknown        paternall aunt    Social History Social History   Tobacco Use  . Smoking status: Never Smoker  . Smokeless tobacco: Never Used  Substance Use Topics  . Alcohol use: No  . Drug use: No     Allergies   Ciprofloxacin; Acetaminophen; and Sulfamethoxazole-trimethoprim   Review of Systems Review of Systems All other systems negative except as documented in the HPI. All pertinent positives and negatives as reviewed in the HPI.  Physical Exam Updated Vital Signs BP (!) 143/85   Pulse 87   Temp 98.3 F (36.8 C) (Oral)   Resp 16   Ht 6\' 4"  (1.93 m)   Wt (!) 230.4 kg   SpO2 99%   BMI 61.84 kg/m   Physical Exam  Constitutional: He is  oriented to person, place, and time. He appears well-developed and well-nourished. No distress.  HENT:  Head: Normocephalic and atraumatic.  Mouth/Throat: Oropharynx is clear and moist.  Eyes: Pupils are equal, round, and reactive to light.  Neck: Normal range of motion. Neck supple.  Cardiovascular: Normal rate, regular rhythm and normal heart sounds. Exam reveals no gallop and no friction rub.  No murmur heard. Pulmonary/Chest: Effort normal and breath sounds normal. No respiratory distress. He has no wheezes.  Musculoskeletal:       Lumbar back: He exhibits tenderness and pain. He exhibits normal range of motion, no bony tenderness, no swelling, no edema,  no deformity, no laceration, no spasm and normal pulse.  Neurological: He is alert and oriented to person, place, and time. He exhibits normal muscle tone. Coordination normal.  Skin: Skin is warm and dry. Capillary refill takes less than 2 seconds. No rash noted. No erythema.  Psychiatric: He has a normal mood and affect. His behavior is normal.  Nursing note and vitals reviewed.    ED Treatments / Results  Labs (all labs ordered are listed, but only abnormal results are displayed) Labs Reviewed - No data to display  EKG None  Radiology Ct Lumbar Spine Wo Contrast  Result Date: 08/12/2018 CLINICAL DATA:  Lumbar radiculopathy.  Left leg pain EXAM: CT LUMBAR SPINE WITHOUT CONTRAST TECHNIQUE: Multidetector CT imaging of the lumbar spine was performed without intravenous contrast administration. Multiplanar CT image reconstructions were also generated. COMPARISON:  Lumbar MRI 10/30/2017 FINDINGS: Segmentation: Normal Alignment: Normal Vertebrae: Negative for fracture or mass. Paraspinal and other soft tissues: Negative for paraspinous mass or fluid collection. Lap banding at the GE junction. Disc levels: L1-2: Mild disc bulging and mild facet degeneration. Mild spinal stenosis. L2-3: Mild disc bulging and mild facet degeneration. Small  right foraminal disc protrusion appears new since the prior study. Mild spinal stenosis. L3-4: Moderately large right-sided disc protrusion with associated spurring with progression since the prior study. Moderate facet degeneration bb. Mild central canal stenosis. Moderate subarticular stenosis on the right. L4-5: Right-sided disc protrusion and osteophyte has progressed in the interval. Marked subarticular stenosis on the right. Moderate facet degeneration bilaterally L5-S1: Progressive right-sided disc protrusion and spurring since the prior study. Previously, the disc protrusion is more centrally oriented, now with progression to the right. Expected impingement right S1 nerve root. Image quality is suboptimal due to large patient size. IMPRESSION: Suboptimal disc evaluation due to large patient size. MRI suggested to confirm the above findings. If the patient is not able to have MRI, CT myelogram would be helpful. Progressive multilevel degenerative changes with stenosis as described above. Electronically Signed   By: Marlan Palau M.D.   On: 08/12/2018 14:54    Procedures Procedures (including critical care time)  Medications Ordered in ED Medications  HYDROmorphone (DILAUDID) injection 1 mg (1 mg Intravenous Given 08/12/18 1151)  HYDROmorphone (DILAUDID) injection 1 mg (1 mg Intravenous Given 08/12/18 1441)  ketorolac (TORADOL) 30 MG/ML injection 30 mg (30 mg Intravenous Given 08/12/18 1442)     Initial Impression / Assessment and Plan / ED Course  I have reviewed the triage vital signs and the nursing notes.  Pertinent labs & imaging results that were available during my care of the patient were reviewed by me and considered in my medical decision making (see chart for details).   Patient does have lower back issues based on his CT scan.  He was not able to get an MRI due to his body habitus.  The patient is advised he needs to follow-up with the neurosurgeon.  The patient has no neurological  deficits noted on exam.  He has normal strength and range of motion his lower extremities.  Patient is able to ambulate as well.  Final Clinical Impressions(s) / ED Diagnoses   Final diagnoses:  None    ED Discharge Orders    None       Charlestine Night, PA-C 08/13/18 1547    Arby Barrette, MD 08/15/18 [phone]

## 2018-08-12 NOTE — ED Notes (Signed)
Patient able to ambulate independently  

## 2018-08-12 NOTE — ED Triage Notes (Signed)
Pt brought in by EMS due having back pain that started yesterday after bending over. Pt endorses left leg and foot tingling. Pt has had issues with dribbling urine since hurting his back yesterday.

## 2018-08-12 NOTE — Discharge Instructions (Addendum)
Return here as needed.  Follow-up with the clinic provided.  Use ice and heat on your lower back °

## 2018-08-14 ENCOUNTER — Observation Stay (HOSPITAL_COMMUNITY)
Admission: EM | Admit: 2018-08-14 | Discharge: 2018-08-16 | Disposition: A | Discharge status: Home / Self Care | Payer: BC Managed Care – PPO | Attending: Internal Medicine | Admitting: Internal Medicine

## 2018-08-14 ENCOUNTER — Other Ambulatory Visit: Payer: Self-pay

## 2018-08-14 ENCOUNTER — Encounter (HOSPITAL_COMMUNITY): Payer: Self-pay | Admitting: Emergency Medicine

## 2018-08-14 DIAGNOSIS — E119 Type 2 diabetes mellitus without complications: Secondary | ICD-10-CM | POA: Diagnosis not present

## 2018-08-14 DIAGNOSIS — M5442 Lumbago with sciatica, left side: Secondary | ICD-10-CM

## 2018-08-14 DIAGNOSIS — M5116 Intervertebral disc disorders with radiculopathy, lumbar region: Secondary | ICD-10-CM | POA: Insufficient documentation

## 2018-08-14 DIAGNOSIS — F419 Anxiety disorder, unspecified: Secondary | ICD-10-CM | POA: Diagnosis not present

## 2018-08-14 DIAGNOSIS — Z794 Long term (current) use of insulin: Secondary | ICD-10-CM | POA: Insufficient documentation

## 2018-08-14 DIAGNOSIS — Z23 Encounter for immunization: Secondary | ICD-10-CM | POA: Insufficient documentation

## 2018-08-14 DIAGNOSIS — M545 Low back pain, unspecified: Secondary | ICD-10-CM | POA: Diagnosis present

## 2018-08-14 DIAGNOSIS — M5136 Other intervertebral disc degeneration, lumbar region: Secondary | ICD-10-CM

## 2018-08-14 DIAGNOSIS — M549 Dorsalgia, unspecified: Secondary | ICD-10-CM | POA: Diagnosis not present

## 2018-08-14 DIAGNOSIS — Z79899 Other long term (current) drug therapy: Secondary | ICD-10-CM | POA: Diagnosis not present

## 2018-08-14 DIAGNOSIS — F329 Major depressive disorder, single episode, unspecified: Secondary | ICD-10-CM | POA: Insufficient documentation

## 2018-08-14 DIAGNOSIS — Z6841 Body Mass Index (BMI) 40.0 and over, adult: Secondary | ICD-10-CM | POA: Diagnosis not present

## 2018-08-14 DIAGNOSIS — G4730 Sleep apnea, unspecified: Secondary | ICD-10-CM | POA: Insufficient documentation

## 2018-08-14 DIAGNOSIS — W1830XA Fall on same level, unspecified, initial encounter: Secondary | ICD-10-CM | POA: Diagnosis not present

## 2018-08-14 DIAGNOSIS — E1169 Type 2 diabetes mellitus with other specified complication: Secondary | ICD-10-CM

## 2018-08-14 LAB — BASIC METABOLIC PANEL
ANION GAP: 9 (ref 5–15)
BUN: 13 mg/dL (ref 6–20)
CHLORIDE: 102 mmol/L (ref 98–111)
CO2: 24 mmol/L (ref 22–32)
Calcium: 9 mg/dL (ref 8.9–10.3)
Creatinine, Ser: 0.67 mg/dL (ref 0.61–1.24)
GFR calc Af Amer: >60 mL/min (ref >60)
Glucose, Bld: 274 mg/dL — ABNORMAL HIGH (ref 70–99)
POTASSIUM: 4.7 mmol/L (ref 3.5–5.1)
SODIUM: 135 mmol/L (ref 135–145)

## 2018-08-14 LAB — CBC
HCT: 43.7 % (ref 39.0–52.0)
HEMOGLOBIN: 14.4 g/dL (ref 13.0–17.0)
MCH: 27.8 pg (ref 26.0–34.0)
MCHC: 33 g/dL (ref 30.0–36.0)
MCV: 84.4 fL (ref 80.0–100.0)
NRBC: 0 % (ref 0.0–0.2)
Platelets: 267 10*3/uL (ref 150–400)
RBC: 5.18 MIL/uL (ref 4.22–5.81)
RDW: 13.5 % (ref 11.5–15.5)
WBC: 7.3 10*3/uL (ref 4.0–10.5)

## 2018-08-14 LAB — GLUCOSE, CAPILLARY
GLUCOSE-CAPILLARY: 218 mg/dL — AB (ref 70–99)
Glucose-Capillary: 202 mg/dL — ABNORMAL HIGH (ref 70–99)
Glucose-Capillary: 243 mg/dL — ABNORMAL HIGH (ref 70–99)
Glucose-Capillary: 194 mg/dL — ABNORMAL HIGH (ref 70–99)

## 2018-08-14 MED ORDER — INFLUENZA VAC SPLIT QUAD 0.5 ML IM SUSY
0.5000 mL | PREFILLED_SYRINGE | INTRAMUSCULAR | Status: AC
  Administered 2018-08-15: 0.5 mL via INTRAMUSCULAR
  Filled 2018-08-14: qty 0.5

## 2018-08-14 MED ORDER — OXYCODONE HCL ER 10 MG PO T12A
10.0000 mg | EXTENDED_RELEASE_TABLET | Freq: Two times a day (BID) | ORAL | Status: AC
  Administered 2018-08-14: 10 mg via ORAL
  Filled 2018-08-14: qty 1

## 2018-08-14 MED ORDER — ENOXAPARIN SODIUM 40 MG/0.4ML ~~LOC~~ SOLN
40.0000 mg | SUBCUTANEOUS | Status: DC
  Administered 2018-08-14 – 2018-08-16 (×3): 40 mg via SUBCUTANEOUS
  Filled 2018-08-14 (×3): qty 0.4

## 2018-08-14 MED ORDER — VENLAFAXINE HCL ER 75 MG PO CP24
225.0000 mg | ORAL_CAPSULE | Freq: Every day | ORAL | Status: DC
  Administered 2018-08-14 – 2018-08-15 (×2): 225 mg via ORAL
  Filled 2018-08-14 (×2): qty 1

## 2018-08-14 MED ORDER — PREDNISONE 20 MG PO TABS
20.0000 mg | ORAL_TABLET | Freq: Every day | ORAL | Status: DC
Start: 2018-08-14 — End: 2018-08-15
  Administered 2018-08-14 – 2018-08-15 (×2): 20 mg via ORAL
  Filled 2018-08-14 (×2): qty 1

## 2018-08-14 MED ORDER — FENTANYL 25 MCG/HR TD PT72
25.0000 ug | MEDICATED_PATCH | Freq: Every day | TRANSDERMAL | Status: DC
  Administered 2018-08-14: 25 ug via TRANSDERMAL
  Filled 2018-08-14: qty 1

## 2018-08-14 MED ORDER — NORTRIPTYLINE HCL 25 MG PO CAPS
25.0000 mg | ORAL_CAPSULE | Freq: Every day | ORAL | Status: DC
  Administered 2018-08-14 – 2018-08-15 (×2): 25 mg via ORAL
  Filled 2018-08-14 (×3): qty 1

## 2018-08-14 MED ORDER — LIDOCAINE-EPINEPHRINE (PF) 2 %-1:200000 IJ SOLN
10.0000 mL | Freq: Once | INTRAMUSCULAR | Status: AC
  Administered 2018-08-14: 10 mL
  Filled 2018-08-14: qty 20

## 2018-08-14 MED ORDER — OXYCODONE HCL ER 10 MG PO T12A
10.0000 mg | EXTENDED_RELEASE_TABLET | Freq: Two times a day (BID) | ORAL | Status: DC
  Administered 2018-08-14: 10 mg via ORAL
  Filled 2018-08-14 (×2): qty 1

## 2018-08-14 MED ORDER — HYDROMORPHONE HCL 1 MG/ML IJ SOLN
1.0000 mg | Freq: Once | INTRAMUSCULAR | Status: AC
  Administered 2018-08-14: 1 mg via INTRAVENOUS
  Filled 2018-08-14: qty 1

## 2018-08-14 MED ORDER — TETANUS-DIPHTH-ACELL PERTUSSIS 5-2.5-18.5 LF-MCG/0.5 IM SUSP
0.5000 mL | Freq: Once | INTRAMUSCULAR | Status: AC
  Administered 2018-08-14: 0.5 mL via INTRAMUSCULAR
  Filled 2018-08-14: qty 0.5

## 2018-08-14 MED ORDER — GLIPIZIDE 5 MG PO TABS
5.0000 mg | ORAL_TABLET | Freq: Every day | ORAL | Status: DC
  Administered 2018-08-14 – 2018-08-16 (×3): 5 mg via ORAL
  Filled 2018-08-14 (×4): qty 1

## 2018-08-14 MED ORDER — HYDROMORPHONE HCL 1 MG/ML IJ SOLN
0.5000 mg | INTRAMUSCULAR | Status: DC | PRN
  Administered 2018-08-14: 0.5 mg via INTRAVENOUS
  Filled 2018-08-14: qty 1

## 2018-08-14 MED ORDER — LIDOCAINE 5 % EX PTCH
1.0000 | MEDICATED_PATCH | CUTANEOUS | Status: DC
  Administered 2018-08-14 – 2018-08-16 (×3): 1 via TRANSDERMAL
  Filled 2018-08-14 (×3): qty 1

## 2018-08-14 MED ORDER — IBUPROFEN 200 MG PO TABS
400.0000 mg | ORAL_TABLET | Freq: Four times a day (QID) | ORAL | Status: DC | PRN
  Administered 2018-08-14: 400 mg via ORAL
  Filled 2018-08-14: qty 2

## 2018-08-14 MED ORDER — CANAGLIFLOZIN 100 MG PO TABS
100.0000 mg | ORAL_TABLET | Freq: Every day | ORAL | Status: DC
  Administered 2018-08-14 – 2018-08-16 (×3): 100 mg via ORAL
  Filled 2018-08-14 (×3): qty 1

## 2018-08-14 MED ORDER — CYCLOBENZAPRINE HCL 10 MG PO TABS: 10.0000 mg | ORAL_TABLET | Freq: Three times a day (TID) | ORAL | Status: DC | PRN

## 2018-08-14 MED ORDER — OXYCODONE HCL 5 MG PO TABS
5.0000 mg | ORAL_TABLET | ORAL | Status: DC | PRN
  Administered 2018-08-14 – 2018-08-15 (×4): 5 mg via ORAL
  Filled 2018-08-14 (×5): qty 1

## 2018-08-14 MED ORDER — GABAPENTIN 300 MG PO CAPS
600.0000 mg | ORAL_CAPSULE | Freq: Three times a day (TID) | ORAL | Status: DC
  Administered 2018-08-14 – 2018-08-16 (×8): 600 mg via ORAL
  Filled 2018-08-14 (×9): qty 2

## 2018-08-14 MED ORDER — INSULIN ASPART 100 UNIT/ML ~~LOC~~ SOLN
0.0000 [IU] | Freq: Three times a day (TID) | SUBCUTANEOUS | Status: DC
  Administered 2018-08-14: 3 [IU] via SUBCUTANEOUS
  Administered 2018-08-14: 5 [IU] via SUBCUTANEOUS
  Administered 2018-08-15 – 2018-08-16 (×2): 2 [IU] via SUBCUTANEOUS

## 2018-08-14 MED ORDER — INSULIN DEGLUDEC 200 UNIT/ML ~~LOC~~ SOPN: 100.0000 [IU] | PEN_INJECTOR | Freq: Every day | SUBCUTANEOUS | Status: DC

## 2018-08-14 MED ORDER — INSULIN GLARGINE 100 UNIT/ML ~~LOC~~ SOLN
10.0000 [IU] | Freq: Two times a day (BID) | SUBCUTANEOUS | Status: DC
  Administered 2018-08-14 – 2018-08-16 (×5): 10 [IU] via SUBCUTANEOUS
  Filled 2018-08-14 (×6): qty 0.1

## 2018-08-14 MED ORDER — ONDANSETRON HCL 4 MG/2ML IJ SOLN
4.0000 mg | Freq: Once | INTRAMUSCULAR | Status: AC
  Administered 2018-08-14: 4 mg via INTRAVENOUS
  Filled 2018-08-14: qty 2

## 2018-08-14 MED ORDER — HYDROXYZINE HCL 10 MG PO TABS
10.0000 mg | ORAL_TABLET | Freq: Three times a day (TID) | ORAL | Status: DC | PRN
  Administered 2018-08-15 – 2018-08-16 (×2): 10 mg via ORAL
  Filled 2018-08-14 (×3): qty 1

## 2018-08-14 MED ORDER — INSULIN ASPART 100 UNIT/ML ~~LOC~~ SOLN
0.0000 [IU] | Freq: Every day | SUBCUTANEOUS | Status: DC
  Administered 2018-08-14 – 2018-08-15 (×2): 2 [IU] via SUBCUTANEOUS

## 2018-08-14 NOTE — ED Triage Notes (Signed)
Pt arrives to ED from home with complaints of back pain and a fall which occurred tonight. EMS reports pt was at home tonight, he has been having a lot of pain in his back, he states his leg gave out on him and he fell. EMS gave 250 mcg of fentenyl. Pt placed in position of comfort with bed locked and lowered, call bell in reach.

## 2018-08-14 NOTE — ED Provider Notes (Signed)
[facility] EMERGENCY DEPARTMENT Provider Note   CSN: [id number] Arrival date & time: 08/14/18  0326     History   Chief Complaint Chief Complaint  Patient presents with  . Fall  . Back Pain    HPI QUINLIN CONANT is a 42 y.o. male.  Patient presents to the emergency department for evaluation after a fall.  Patient has a history of chronic back pain, has had recent increase in his low back pain prompting outpatient work-up.  He has had pain in the left lower back with radiation to the left leg.  He was seen at [facility] yesterday after he had worsening pain and an episode of bladder and bowel incontinence.  There was concern over possible cauda equina syndrome, he had a CT myelogram of his lower back that showed mild bulging disks.  He is on oxycodone, muscle relaxer, Medrol Dosepak currently.  Patient reports that he was walking in his home tonight when the pain suddenly worsened in his left leg and his left leg gave out.  He fell to the ground, striking the right side of his forehead.  No loss of consciousness, does have a mild headache.  Reports severe low back pain, worsened with movement.  Administered fentanyl by EMS with some improvement.     Past Medical History:  Diagnosis Date  . Anal fissure   . Anxiety   . BPH (benign prostatic hyperplasia)   . Depression    sees Dr. Archer Asa   . Fever of unknown origin    seeing Dr. Enedina Finner   . Morbid obesity (HCC)   . Nausea    chronic- Dr Leone Payor  . Overactive bladder   . Overactive bladder   . Pain, low back   . Plantar fasciitis   . Sleep apnea   . Ureterolithiasis     Patient Active Problem List   Diagnosis Date Noted  . Morbid obesity (HCC) 03/26/2012  . CHRONIC PROSTATITIS 01/08/2011  . FEVER UNSPECIFIED 01/08/2011  . ABDOMINAL PAIN RIGHT UPPER QUADRANT 05/15/2010  . DIARRHEA-PRESUMED INFECTIOUS 07/31/2009  . ANAL FISSURE 07/31/2009  . HEMATEMESIS 07/31/2009  . NAUSEA AND  VOMITING 07/31/2009  . VOMITING 07/31/2009  . DYSPHAGIA UNSPECIFIED 07/31/2009  . DYSPHAGIA 07/31/2009  . GERD 07/25/2009  . HEADACHE 07/19/2009  . HEMORRHOID, THROMBOSED 05/15/2009  . OVERACTIVE BLADDER 12/15/2007  . ANXIETY 08/03/2007  . DEPRESSION 08/03/2007  . LOW BACK PAIN 08/03/2007  . SLEEP APNEA 08/03/2007    Past Surgical History:  Procedure Laterality Date  . ANAL SPHINCTEROTOMY     dr Luisa Hart 06/04/09  . APPENDECTOMY    . ESOPHAGOGASTRODUODENOSCOPY     dr Leone Payor - normal  . lab band  06/06/2008   dr Johna Sheriff        Home Medications    Prior to Admission medications   Medication Sig Start Date End Date Taking? Authorizing Provider  cyclobenzaprine (FLEXERIL) 10 MG tablet Take 1 tablet (10 mg total) by mouth 3 (three) times daily as needed for muscle spasms. 08/12/18   Lawyer, Chaddrick Brue, PA-C  FARXIGA 10 MG TABS tablet Take 10 mg by mouth daily. 08/09/18   [provider]  fentaNYL (DURAGESIC - DOSED MCG/HR) 25 MCG/HR patch Place 1 patch onto the skin daily. 08/09/18   [provider]  gabapentin (NEURONTIN) 300 MG capsule Take 600 mg by mouth 3 (three) times daily.  10/06/17   [provider]  glipiZIDE (GLUCOTROL) 5 MG tablet Take 5 mg by mouth  daily. 07/31/18   [provider]  hydrOXYzine (ATARAX/VISTARIL) 10 MG tablet Take 10 mg by mouth 3 (three) times daily. 10/01/17   [provider]  ibuprofen (ADVIL,MOTRIN) 200 MG tablet Take 400 mg by mouth every 6 (six) hours as needed for moderate pain.     [provider]  insulin aspart (NOVOLOG) 100 UNIT/ML injection Inject 20-32 Units into the skin 3 (three) times daily before meals. Sliding scale    [provider]  lidocaine (LIDODERM) 5 % Place 1 patch onto the skin daily. Remove & Discard patch within 12 hours or as directed by MD Patient not taking: Reported on 08/12/2018 10/30/17   Petrucelli, Pleas Koch, PA-C  metFORMIN (GLUCOPHAGE) 1000 MG tablet Take  1,000 mg by mouth 2 (two) times daily with a meal.    [provider]  naproxen (NAPROSYN) 500 MG tablet Take 1 tablet (500 mg total) by mouth 2 (two) times daily. Patient not taking: Reported on 08/12/2018 10/30/17   Petrucelli, Pleas Koch, PA-C  nortriptyline (PAMELOR) 25 MG capsule Take 25 mg by mouth at bedtime. 08/12/18   [provider]  oxyCODONE (ROXICODONE) 5 MG immediate release tablet Take 1 tablet (5 mg total) by mouth every 4 (four) hours as needed for severe pain. 08/12/18   Lawyer, Lianny Molter, PA-C  OZEMPIC, 1 MG/DOSE, 2 MG/1.5ML SOPN Inject 1 mL as directed once a week. 08/09/18   [provider]  predniSONE (DELTASONE) 20 MG tablet Take 1 tablet (20 mg total) by mouth daily with breakfast. Watch your blood sugars closely while taking medication 08/12/18   Lawyer, Cristal Deer, PA-C  QUEtiapine (SEROQUEL) 50 MG tablet Take 100 mg by mouth at bedtime. 09/29/17   [provider]  testosterone cypionate (DEPOTESTOSTERONE CYPIONATE) 200 MG/ML injection Inject 0.5 mLs into the muscle every 14 (fourteen) days. 04/27/18 04/27/19  [provider]  tiZANidine (ZANAFLEX) 2 MG tablet Take 2 mg by mouth every 8 (eight) hours. 08/12/18   [provider]  TRESIBA FLEXTOUCH 200 UNIT/ML SOPN Inject 100 Units into the skin daily.  10/03/17   [provider]  venlafaxine XR (EFFEXOR-XR) 75 MG 24 hr capsule Take 225 mg by mouth at bedtime. 04/19/16   [provider]    Family History Family History  Problem Relation Age of Onset  . Cancer Father        colon  . Depression Other   . Hypertension Other   . Diabetes Other   . COPD Other        adrenal tumors  . Cystic fibrosis Other        paternall cousins  . Inflammatory bowel disease Unknown        paternall aunt    Social History Social History   Tobacco Use  . Smoking status: Never Smoker  . Smokeless tobacco: Never Used  Substance Use Topics  . Alcohol use: No  .  Drug use: No     Allergies   Ciprofloxacin; Acetaminophen; and Sulfamethoxazole-trimethoprim   Review of Systems Review of Systems  Musculoskeletal: Positive for back pain.  Neurological: Positive for headaches.  All other systems reviewed and are negative.    Physical Exam Updated Vital Signs BP (!) 123/59   Pulse 81   Temp 99 F (37.2 C) (Oral)   Resp 18   Ht 6' (1.829 m)   Wt (!) 230 kg   SpO2 97%   BMI 68.77 kg/m   Physical Exam  Constitutional: He is oriented to person,  place, and time. He appears well-developed and well-nourished. No distress.  HENT:  Head: Normocephalic. Head is with laceration.    Right Ear: Hearing normal.  Left Ear: Hearing normal.  Nose: Nose normal.  Mouth/Throat: Oropharynx is clear and moist and mucous membranes are normal.  Eyes: Pupils are equal, round, and reactive to light. Conjunctivae and EOM are normal.  Neck: Normal range of motion. Neck supple.  Cardiovascular: Regular rhythm, S1 normal and S2 normal. Exam reveals no gallop and no friction rub.  No murmur heard. Pulmonary/Chest: Effort normal and breath sounds normal. No respiratory distress. He exhibits no tenderness.  Abdominal: Soft. Normal appearance and bowel sounds are normal. There is no hepatosplenomegaly. There is no tenderness. There is no rebound, no guarding, no tenderness at McBurney's point and negative Murphy's sign. No hernia.  Musculoskeletal: Normal range of motion.  Able to move both lower extremities.  Patient has difficulty raising the left leg because of increased pain, has normal sensation to light touch.  Reports pain with active and passive movement of the left leg.  Neurological: He is alert and oriented to person, place, and time. He has normal strength. No cranial nerve deficit or sensory deficit. Coordination normal. GCS eye subscore is 4. GCS verbal subscore is 5. GCS motor subscore is 6.  Skin: Skin is warm and dry. Laceration noted. No rash noted.  No cyanosis.  Psychiatric: He has a normal mood and affect. His speech is normal and behavior is normal. Thought content normal.  Nursing note and vitals reviewed.    ED Treatments / Results  Labs (all labs ordered are listed, but only abnormal results are displayed) Labs Reviewed  BASIC METABOLIC PANEL - Abnormal; Notable for the following components:      Result Value   Glucose, Bld 274 (*)    All other components within normal limits  CBC    EKG EKG Interpretation  Date/Time:  Friday August 14 2018 03:37:42 EDT Ventricular Rate:  81 PR Interval:    QRS Duration: 118 QT Interval:  373 QTC Calculation: 433 R Axis:   -50 Text Interpretation:  Sinus rhythm  ST elev, probable normal early repol pattern Baseline wander in lead(s) III No significant change since last tracing Confirmed by Gilda Crease [phone]) on 08/14/2018 3:45:28 AM   Radiology Ct Lumbar Spine Wo Contrast  Result Date: 08/12/2018 CLINICAL DATA:  Lumbar radiculopathy.  Left leg pain EXAM: CT LUMBAR SPINE WITHOUT CONTRAST TECHNIQUE: Multidetector CT imaging of the lumbar spine was performed without intravenous contrast administration. Multiplanar CT image reconstructions were also generated. COMPARISON:  Lumbar MRI 10/30/2017 FINDINGS: Segmentation: Normal Alignment: Normal Vertebrae: Negative for fracture or mass. Paraspinal and other soft tissues: Negative for paraspinous mass or fluid collection. Lap banding at the GE junction. Disc levels: L1-2: Mild disc bulging and mild facet degeneration. Mild spinal stenosis. L2-3: Mild disc bulging and mild facet degeneration. Small right foraminal disc protrusion appears new since the prior study. Mild spinal stenosis. L3-4: Moderately large right-sided disc protrusion with associated spurring with progression since the prior study. Moderate facet degeneration bb. Mild central canal stenosis. Moderate subarticular stenosis on the right. L4-5: Right-sided disc  protrusion and osteophyte has progressed in the interval. Marked subarticular stenosis on the right. Moderate facet degeneration bilaterally L5-S1: Progressive right-sided disc protrusion and spurring since the prior study. Previously, the disc protrusion is more centrally oriented, now with progression to the right. Expected impingement right S1 nerve root. Image quality is suboptimal due to large  patient size. IMPRESSION: Suboptimal disc evaluation due to large patient size. MRI suggested to confirm the above findings. If the patient is not able to have MRI, CT myelogram would be helpful. Progressive multilevel degenerative changes with stenosis as described above. Electronically Signed   By: Marlan Palau M.D.   On: 08/12/2018 14:54    Procedures .Marland KitchenLaceration Repair Date/Time: 08/14/2018 6:20 AM Performed by: Gilda Crease, MD Authorized by: Gilda Crease, MD   Consent:    Consent obtained:  Verbal   Consent given by:  Patient   Risks discussed:  Infection, pain and poor cosmetic result Universal protocol:    Procedure explained and questions answered to patient or proxy's satisfaction: yes     Site/side marked: yes     Immediately prior to procedure, a time out was called: yes     Patient identity confirmed:  Verbally with patient Anesthesia (see MAR for exact dosages):    Anesthesia method:  Local infiltration   Local anesthetic:  Lidocaine 2% WITH epi Laceration details:    Location:  Face   Face location:  Forehead   Length (cm):  2 Repair type:    Repair type:  Simple Pre-procedure details:    Preparation:  Patient was prepped and draped in usual sterile fashion Exploration:    Hemostasis achieved with:  Epinephrine   Contaminated: no   Treatment:    Area cleansed with:  Betadine   Amount of cleaning:  Standard   Irrigation solution:  Sterile saline   Irrigation method:  Syringe Skin repair:    Repair method:  Sutures   Suture size:  5-0   Suture  material:  Fast-absorbing gut   Suture technique:  Simple interrupted   Number of sutures:  5 Post-procedure details:    Dressing:  Open (no dressing)   (including critical care time)  Medications Ordered in ED Medications  lidocaine-EPINEPHrine (XYLOCAINE W/EPI) 2 %-1:200000 (PF) injection 10 mL (has no administration in time range)  Tdap (BOOSTRIX) injection 0.5 mL (0.5 mLs Intramuscular Given 08/14/18 0400)  HYDROmorphone (DILAUDID) injection 1 mg (1 mg Intravenous Given 08/14/18 0434)  ondansetron (ZOFRAN) injection 4 mg (4 mg Intravenous Given 08/14/18 0434)     Initial Impression / Assessment and Plan / ED Course  I have reviewed the triage vital signs and the nursing notes.  Pertinent labs & imaging results that were available during my care of the patient were reviewed by me and considered in my medical decision making (see chart for details).     Patient presents to the emergency department for evaluation after a fall.  Patient has had ongoing issues with left-sided sciatica and low back pain.  He was evaluated at [facility] yesterday.  He had a CT myelogram performed because he was too large for the MRI scanner.  Myelogram showed degenerative changes and possibly slight bulging disks, and was ruled out for cauda equina syndrome and does not qualify for any surgery.  He was evaluated by his back surgeon when he was in the emergency department yesterday at [facility].  Patient is complaining of pain in the low back but it is somewhat unchanged.  He does not have any focal neurologic deficits.  Does not require repeat imaging of his back.  He has been provided analgesia with only partial relief of his pain.  Patient did have a laceration on his head.  No evidence of significant head injury.  Sutures were placed.  These are absorbable sutures, do not  need to be taken out.  Patient requiring recurrent doses of analgesia for his chronic low back pain that has worsened  over the last 24 hours.  We will ask hospitalist for admission.  Final Clinical Impressions(s) / ED Diagnoses   Final diagnoses:  Left-sided low back pain with left-sided sciatica, unspecified chronicity    ED Discharge Orders    None       Gilda Crease, MD 08/14/18 [phone]

## 2018-08-14 NOTE — Progress Notes (Signed)
Inpatient Diabetes Program Recommendations  AACE/ADA: New Consensus Statement on Inpatient Glycemic Control (2015)  Target Ranges:  Prepandial:   less than 140 mg/dL      Peak postprandial:   less than 180 mg/dL (1-2 hours)      Critically ill patients:  140 - 180 mg/dL   Lab Results  Component Value Date   GLUCAP 243 (H) 08/14/2018    Review of Glycemic ControlResults for Angel Pope, Angel Pope (MRN [id number]) as of 08/14/2018 13:39  Ref. Range 08/14/2018 10:34 08/14/2018 12:11  Glucose-Capillary Latest Ref Range: 70 - 99 mg/dL 409 (H) 811 (H)    Diabetes history: Type 2 DM  Outpatient Diabetes medications: Glucotrol 5 mg daily, Novolog 20-32 units tid with Meals, Tresiba 100 units daily, Farxiga 10 mg daily Current orders for Inpatient glycemic control:  Tresiba 100 units daily, Novolog moderate tid with meals and HS, Invokana 100 mg daily, Glucotrol 5 mg daily, Lantus 10 units bid, Prednisone 20 mg daily Inpatient Diabetes Program Recommendations:   Please consider d/c of Tresiba (not on formulary), Invokana and Glucotrol while patient is in the hospital.  Also may consider increasing Lantus to 20 units bid. Add Novolog meal coverage 4 units tid with meals.   Thanks,  Beryl Meager, RN, BC-ADM Inpatient Diabetes Coordinator Pager [phone] (8a-5p)

## 2018-08-14 NOTE — Progress Notes (Signed)
RT arrived to help pt place CPAP on and pt had already placed himself on CPAP. Pt current setting on [facility] CPAP is 20cmH2O. Pt resting comfortably. RT will continue to monitor.

## 2018-08-14 NOTE — ED Notes (Signed)
ED Provider at bedside. 

## 2018-08-14 NOTE — H&P (Signed)
History and Physical  Angel Pope ZOX:[id number] DOB: 1976-02-22 DOA: 08/14/2018  Referring physician: ER physician PCP: Mariel Craft, FNP  Outpatient Specialists: Back surgeon Patient coming from: Home  Chief Complaint: Intractable back pain on fall.  HPI: Patient is a 42 year old male, morbidly obese, with past medical history significant for chronic back pain, radiculopathy, disc disease and protrusion.  Patient is known to an Ecologist based out of [place], [place].  According to the patient, he developed cramps in both legs with associated severe back pain about 2 days ago after he bent down to pick up a stick from the ground.  Patient took potassiums supplement and felt a bit better.  The back pain reocurred and the patient was brought to this hospital by his father.  MRI was considered, but patient was felt to be too big for the MRI.  The patient was asked to follow-up with his primary care provider.  While at the PCPs office, patient reported having lost bowel and urinary function, with resultant diarrhea and loss of urinary control.  Patient's primary care provider's office advised patient to go to [facility] for further assessment and management.  The patient underwent CT myelogram at [facility] as there were concerns for possible cauda equina syndrome.  On discharge from the [facility], patient had another episode where he lost control of his bowel and urine.  Patient's back pain continued yesterday, and patient reported his left lower extremity gave way resulting in a fall.  Patient represents to this hospital today with history of fall and severe back pain.  ER provider has ruled out any emergent neurological deficit, and has asked the hospitalist service to admit patient for pain control.  ED Course: ER team try to control patient's pain without significant success.  Hospitalist team has been asked to admit patient for pain control.  Pertinent  labs: Chemistry reveals sodium of 135, potassium of 4.7, chloride 102, CO2 24, BUN of 13 and creatinine of 0.67 with blood sugar of 274.  CBC reveals WBC of 7.3, hemoglobin of 14.4, hematocrit of 40.7, MCV of 84.4 with platelet count of 267. EKG: Independently reviewed.  Imaging: independently reviewed.   Review of Systems:  Negative for fever, visual changes, sore throat, rash, new muscle aches, chest pain, SOB, dysuria, bleeding, n/v/abdominal pain.  Past Medical History:  Diagnosis Date  . Anal fissure   . Anxiety   . BPH (benign prostatic hyperplasia)   . Depression    sees Dr. Archer Asa   . Fever of unknown origin    seeing Dr. Enedina Finner   . Morbid obesity (HCC)   . Nausea    chronic- Dr Leone Payor  . Overactive bladder   . Overactive bladder   . Pain, low back   . Plantar fasciitis   . Sleep apnea   . Ureterolithiasis     Past Surgical History:  Procedure Laterality Date  . ANAL SPHINCTEROTOMY     dr Luisa Hart 06/04/09  . APPENDECTOMY    . ESOPHAGOGASTRODUODENOSCOPY     dr Leone Payor - normal  . lab band  06/06/2008   dr Johna Sheriff     reports that he has never smoked. He has never used smokeless tobacco. He reports that he does not drink alcohol or use drugs.  Allergies  Allergen Reactions  . Ciprofloxacin Hives  . Aspartame And Phenylalanine Hives and Nausea And Vomiting    Headache  . Acetaminophen Itching  . Sulfamethoxazole-Trimethoprim Itching    Family  History  Problem Relation Age of Onset  . Cancer Father        colon  . Depression Other   . Hypertension Other   . Diabetes Other   . COPD Other        adrenal tumors  . Cystic fibrosis Other        paternall cousins  . Inflammatory bowel disease Unknown        paternall aunt     Prior to Admission medications   Medication Sig Start Date End Date Taking? Authorizing Provider  FARXIGA 10 MG TABS tablet Take 10 mg by mouth daily. 08/09/18  Yes [provider]  fentaNYL (DURAGESIC - DOSED  MCG/HR) 25 MCG/HR patch Place 1 patch onto the skin daily. 08/09/18  Yes [provider]  gabapentin (NEURONTIN) 300 MG capsule Take 600 mg by mouth 3 (three) times daily.  10/06/17  Yes [provider]  glipiZIDE (GLUCOTROL) 5 MG tablet Take 5 mg by mouth daily. 07/31/18  Yes [provider]  hydrOXYzine (ATARAX/VISTARIL) 10 MG tablet Take 10 mg by mouth 3 (three) times daily. 10/01/17  Yes [provider]  ibuprofen (ADVIL,MOTRIN) 200 MG tablet Take 400 mg by mouth every 6 (six) hours as needed for moderate pain.    Yes [provider]  insulin aspart (NOVOLOG) 100 UNIT/ML injection Inject 20-32 Units into the skin 3 (three) times daily before meals. Sliding scale   Yes [provider]  lidocaine (XYLOCAINE) 5 % ointment Apply 1 application topically daily as needed. 07/14/18  Yes [provider]  metFORMIN (GLUCOPHAGE) 1000 MG tablet Take 1,000 mg by mouth 2 (two) times daily with a meal.   Yes [provider]  nortriptyline (PAMELOR) 25 MG capsule Take 25 mg by mouth at bedtime. 08/12/18  Yes [provider]  oxyCODONE (ROXICODONE) 5 MG immediate release tablet Take 1 tablet (5 mg total) by mouth every 4 (four) hours as needed for severe pain. 08/12/18  Yes Lawyer, Christopher, PA-C  OZEMPIC, 1 MG/DOSE, 2 MG/1.5ML SOPN Inject 1 mL as directed once a week. 08/09/18  Yes [provider]  testosterone cypionate (DEPOTESTOSTERONE CYPIONATE) 200 MG/ML injection Inject 0.5 mLs into the muscle every 14 (fourteen) days. 04/27/18 04/27/19 Yes [provider]  tiZANidine (ZANAFLEX) 2 MG tablet Take 2 mg by mouth every 8 (eight) hours. 08/12/18  Yes [provider]  TRESIBA FLEXTOUCH 200 UNIT/ML SOPN Inject 100 Units into the skin daily.  10/03/17  Yes [provider]  venlafaxine XR (EFFEXOR-XR) 75 MG 24 hr capsule Take 225 mg by mouth at bedtime. 04/19/16  Yes [provider]  cyclobenzaprine  (FLEXERIL) 10 MG tablet Take 1 tablet (10 mg total) by mouth 3 (three) times daily as needed for muscle spasms. 08/12/18   Lawyer, Cristal Deer, PA-C  lidocaine (LIDODERM) 5 % Place 1 patch onto the skin daily. Remove & Discard patch within 12 hours or as directed by MD Patient not taking: Reported on 08/12/2018 10/30/17   Petrucelli, Pleas Koch, PA-C  methylPREDNISolone (MEDROL DOSEPAK) 4 MG TBPK tablet Take 4 mg by mouth. 08/13/18 08/20/18  [provider]  naproxen (NAPROSYN) 500 MG tablet Take 1 tablet (500 mg total) by mouth 2 (two) times daily. Patient not taking: Reported on 08/12/2018 10/30/17   Petrucelli, Pleas Koch, PA-C  predniSONE (DELTASONE) 20 MG tablet Take 1 tablet (20 mg total) by mouth daily with breakfast. Watch your blood sugars closely while taking medication 08/12/18   Charlestine Night, PA-C  Physical Exam: Vitals:   08/14/18 0400 08/14/18 0445 08/14/18 0515 08/14/18 0600  BP: (!) 145/80 (!) 117/54 (!) 123/59 131/73  Pulse: 73 78 81 92  Resp: 12 13 18  (!) 24  Temp:      TempSrc:      SpO2: 97% 98% 97% 97%  Weight:      Height:        Constitutional:  . Patient is morbidly obese.  Appears calm and comfortable in bed Eyes:  . No pallor. No jaundice.  ENMT:  . external ears, nose appear normal Neck:  . Neck is supple. No JVD Respiratory:  . CTA bilaterally, no w/r/r.  . Respiratory effort normal. No retractions or accessory muscle use Cardiovascular:  . S1S2 . Bilateral lower extremity edema.    Abdomen:  . Abdomen is morbidly obese, soft and non tender. Organs are difficult to assess. Neurologic:  . Awake and alert. . Moves all limbs.  Wt Readings from Last 3 Encounters:  08/14/18 (!) 230 kg  08/12/18 (!) 230.4 kg  05/01/16 (!) 220 kg    I have personally reviewed following labs and imaging studies  Labs on Admission:  CBC: Recent Labs  Lab 08/14/18 0433  WBC 7.3  HGB 14.4  HCT 43.7  MCV 84.4  PLT 267   Basic Metabolic  Panel: Recent Labs  Lab 08/14/18 0433  NA 135  K 4.7  CL 102  CO2 24  GLUCOSE 274*  BUN 13  CREATININE 0.67  CALCIUM 9.0   Liver Function Tests: No results for input(s): AST, ALT, ALKPHOS, BILITOT, PROT, ALBUMIN in the last 168 hours. No results for input(s): LIPASE, AMYLASE in the last 168 hours. No results for input(s): AMMONIA in the last 168 hours. Coagulation Profile: No results for input(s): INR, PROTIME in the last 168 hours. Cardiac Enzymes: No results for input(s): CKTOTAL, CKMB, CKMBINDEX, TROPONINI in the last 168 hours. BNP (last 3 results) No results for input(s): PROBNP in the last 8760 hours. HbA1C: No results for input(s): HGBA1C in the last 72 hours. CBG: No results for input(s): GLUCAP in the last 168 hours. Lipid Profile: No results for input(s): CHOL, HDL, LDLCALC, TRIG, CHOLHDL, LDLDIRECT in the last 72 hours. Thyroid Function Tests: No results for input(s): TSH, T4TOTAL, FREET4, T3FREE, THYROIDAB in the last 72 hours. Anemia Panel: No results for input(s): VITAMINB12, FOLATE, FERRITIN, TIBC, IRON, RETICCTPCT in the last 72 hours. Urine analysis:    Component Value Date/Time   COLORURINE AMBER (A) 05/01/2016 1646   APPEARANCEUR CLOUDY (A) 05/01/2016 1646   LABSPEC 1.041 (H) 05/01/2016 1646   PHURINE 6.0 05/01/2016 1646   GLUCOSEU >1000 (A) 05/01/2016 1646   HGBUR LARGE (A) 05/01/2016 1646   HGBUR negative 05/15/2010 1552   BILIRUBINUR NEGATIVE 05/01/2016 1646   KETONESUR NEGATIVE 05/01/2016 1646   PROTEINUR NEGATIVE 05/01/2016 1646   UROBILINOGEN 0.2 03/27/2013 1304   NITRITE NEGATIVE 05/01/2016 1646   LEUKOCYTESUR NEGATIVE 05/01/2016 1646   Sepsis Labs: @LABRCNTIP (procalcitonin:4,lacticidven:4) )No results found for this or any previous visit (from the past 240 hour(s)).    Radiological Exams on Admission: Ct Lumbar Spine Wo Contrast  Result Date: 08/12/2018 CLINICAL DATA:  Lumbar radiculopathy.  Left leg pain EXAM: CT LUMBAR SPINE  WITHOUT CONTRAST TECHNIQUE: Multidetector CT imaging of the lumbar spine was performed without intravenous contrast administration. Multiplanar CT image reconstructions were also generated. COMPARISON:  Lumbar MRI 10/30/2017 FINDINGS: Segmentation: Normal Alignment: Normal Vertebrae: Negative for fracture or mass. Paraspinal and other soft tissues: Negative  for paraspinous mass or fluid collection. Lap banding at the GE junction. Disc levels: L1-2: Mild disc bulging and mild facet degeneration. Mild spinal stenosis. L2-3: Mild disc bulging and mild facet degeneration. Small right foraminal disc protrusion appears new since the prior study. Mild spinal stenosis. L3-4: Moderately large right-sided disc protrusion with associated spurring with progression since the prior study. Moderate facet degeneration bb. Mild central canal stenosis. Moderate subarticular stenosis on the right. L4-5: Right-sided disc protrusion and osteophyte has progressed in the interval. Marked subarticular stenosis on the right. Moderate facet degeneration bilaterally L5-S1: Progressive right-sided disc protrusion and spurring since the prior study. Previously, the disc protrusion is more centrally oriented, now with progression to the right. Expected impingement right S1 nerve root. Image quality is suboptimal due to large patient size. IMPRESSION: Suboptimal disc evaluation due to large patient size. MRI suggested to confirm the above findings. If the patient is not able to have MRI, CT myelogram would be helpful. Progressive multilevel degenerative changes with stenosis as described above. Electronically Signed   By: Marlan Palau M.D.   On: 08/12/2018 14:54    EKG: Independently reviewed.   Active Problems:   Low back pain   Assessment/Plan Back pain: Uncontrolled. Admit patient for further management of the back pain. Lidocaine patch. OxyContin 10 mg p.o. twice daily for only 24 hours. PRN short acting opiates. Continue  steroids. NSAIDs as needed. Fentanyl patch 25 MCG per hour to be changed every 72 hours Gradually wean off opiates when pain is optimized. Patient will need to follow with the orthopedic surgery team on discharge.  Morbid obesity: Diet and exercise. Significant weight loss will help with back pain control.  Lumbar radiculopathy/degenerative disc disease: Orthopedic surgery is already following the patient. Adequate pain control. Monitor patient's respiratory symptoms while on opiates.  Diabetes mellitus: Continue to optimize. Blood sugar closely.  Further management will depend on hospital course.  DVT prophylaxis: Subcu Lovenox Code Status: Full code Family Communication:  Disposition Plan: Will depend on hospital cours Consults called: Physical therapy Admission status: Observation  Time spent: 60 minutes.  Berton Mount, MD  [facility] Pager #: [phone] 7PM-7AM contact night coverage as above  08/14/2018, 7:51 AM

## 2018-08-14 NOTE — Plan of Care (Signed)

## 2018-08-15 DIAGNOSIS — M5442 Lumbago with sciatica, left side: Secondary | ICD-10-CM | POA: Diagnosis not present

## 2018-08-15 LAB — GLUCOSE, CAPILLARY
GLUCOSE-CAPILLARY: 114 mg/dL — AB (ref 70–99)
Glucose-Capillary: 124 mg/dL — ABNORMAL HIGH (ref 70–99)
Glucose-Capillary: 115 mg/dL — ABNORMAL HIGH (ref 70–99)
Glucose-Capillary: 218 mg/dL — ABNORMAL HIGH (ref 70–99)

## 2018-08-15 MED ORDER — METHYLPREDNISOLONE 4 MG PO TBPK: 8.0000 mg | ORAL_TABLET | Freq: Every evening | ORAL | Status: DC

## 2018-08-15 MED ORDER — FENTANYL 25 MCG/HR TD PT72: 25.0000 ug | MEDICATED_PATCH | TRANSDERMAL | Status: DC

## 2018-08-15 MED ORDER — IBUPROFEN 200 MG PO TABS
400.0000 mg | ORAL_TABLET | Freq: Three times a day (TID) | ORAL | Status: DC
  Administered 2018-08-15: 400 mg via ORAL
  Filled 2018-08-15: qty 2

## 2018-08-15 MED ORDER — KETOROLAC TROMETHAMINE 30 MG/ML IJ SOLN
30.0000 mg | Freq: Four times a day (QID) | INTRAMUSCULAR | Status: DC | PRN
  Administered 2018-08-15: 30 mg via INTRAVENOUS
  Filled 2018-08-15: qty 1

## 2018-08-15 MED ORDER — METHYLPREDNISOLONE 4 MG PO TBPK: 4.0000 mg | ORAL_TABLET | Freq: Four times a day (QID) | ORAL | Status: DC

## 2018-08-15 MED ORDER — METHYLPREDNISOLONE 4 MG PO TBPK
8.0000 mg | ORAL_TABLET | Freq: Every morning | ORAL | Status: AC
  Filled 2018-08-15: qty 21

## 2018-08-15 MED ORDER — METHYLPREDNISOLONE 4 MG PO TBPK
4.0000 mg | ORAL_TABLET | Freq: Three times a day (TID) | ORAL | Status: DC
  Administered 2018-08-16 (×2): 4 mg via ORAL

## 2018-08-15 MED ORDER — METHYLPREDNISOLONE 4 MG PO TBPK
8.0000 mg | ORAL_TABLET | Freq: Every evening | ORAL | Status: AC
  Administered 2018-08-15: 8 mg via ORAL

## 2018-08-15 MED ORDER — CYCLOBENZAPRINE HCL 10 MG PO TABS
10.0000 mg | ORAL_TABLET | Freq: Three times a day (TID) | ORAL | Status: DC
  Administered 2018-08-15 – 2018-08-16 (×5): 10 mg via ORAL
  Filled 2018-08-15 (×5): qty 1

## 2018-08-15 MED ORDER — METHYLPREDNISOLONE 4 MG PO TBPK
4.0000 mg | ORAL_TABLET | ORAL | Status: AC
  Administered 2018-08-15: 4 mg via ORAL

## 2018-08-15 MED ORDER — OXYCODONE HCL 5 MG PO TABS
5.0000 mg | ORAL_TABLET | ORAL | Status: DC | PRN
  Administered 2018-08-15 – 2018-08-16 (×7): 10 mg via ORAL
  Filled 2018-08-15 (×7): qty 2

## 2018-08-15 NOTE — Evaluation (Signed)
Physical Therapy Evaluation Patient Details Name: Angel Pope MRN: [id number] DOB: Jun 10, 1976 Today's Date: 08/15/2018   History of Present Illness  Patient is a 42 year old male, morbidly obese, with past medical history significant for chronic back pain, radiculopathy, disc disease and protrusion.  Patient is known to an Ecologist based out of [place], [place].  According to the patient, he developed cramps in both legs with associated severe back pain about 2 days ago after he bent down to pick up a stick from the ground. Pt has had left leg weakness and incontinent bladder and bowels - when pain severe  Clinical Impression  Pt reports he did better today than he has done in days.  Pt abel to sit EOB and walk approx 10 feet with RW.  Pt got nauseous and returned to bed.  Pt working on standing tall and extending when up - this felt good.  Pt educated on body mechanics - not bending or twisting.  Pt encouraged to use lumbar roll when he gets up to chair to help this.  Pt encouraged to get up later today with nursing and will get him a different chair for him to sit in - that should be more comfortable.    Follow Up Recommendations Home health PT;Supervision for mobility/OOB    Equipment Recommendations  Rolling walker with 5" wheels;3in1 (PT)(bariatric RW with 5" wheels and bariatric 3n1)    Recommendations for Other Services       Precautions / Restrictions Precautions Precautions: Fall;Back Precaution Comments: pt educated on body mechanics and back precautions Restrictions Weight Bearing Restrictions: No      Mobility  Bed Mobility Overal bed mobility: Needs Assistance             General bed mobility comments: pt given cues to prevent any twisting when getting up. pt did well - needed cues for technque and min assist to get weigth up onto his arms only  Transfers Overall transfer level: Needs assistance Equipment used: Rolling walker (2  wheeled) Transfers: Sit to/from Stand Sit to Stand: +2 safety/equipment         General transfer comment: pt given cues for hand placement.  bed raised to help pt get up from higher surface.  pt reminded to look up to help keep back st raighter with sit to stand and stand to sit  Ambulation/Gait Ambulation/Gait assistance: +2 safety/equipment Gait Distance (Feet): 10 Feet Assistive device: Rolling walker (2 wheeled)       General Gait Details: pt standing wtih RW - educated on technque.  pt walked 3 feet from bed and got nauseated.  I had pt back up to bed and march  in place until he needed to sit down.  will get bigger chair for pt and will follow him with chair next time he gets up to walk.  Stairs            Wheelchair Mobility    Modified Rankin (Stroke Patients Only)       Balance                                             Pertinent Vitals/Pain Pain Assessment: 0-10 Pain Score: 6  Pain Location: low back Pain Descriptors / Indicators: Aching;Burning;Guarding;Grimacing;Tightness Pain Intervention(s): Limited activity within patient's tolerance;Monitored during session;Repositioned(pt requesting heating pad to his back)    Home Living Family/patient  expects to be discharged to:: Private residence Living Arrangements: Alone Available Help at Discharge: Family Type of Home: House Home Access: Stairs to enter   Secretary/administrator of Steps: 2   Home Equipment: None      Prior Function           Comments: pt is a Runner, 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  pt moved closer to his parents for support - in house next door.  pt has been trying to take a walk daily as part of his recovery     Hand Dominance        Extremity/Trunk Assessment        Lower Extremity Assessment Lower Extremity Assessment: Overall WFL for tasks assessed(today left leg not weak - pt said only with  extreme pain)    Cervical / Trunk Assessment Cervical / Trunk Assessment: Normal  Communication   Communication: No difficulties  Cognition Arousal/Alertness: Awake/alert Behavior During Therapy: WFL for tasks assessed/performed Overall Cognitive Status: Within Functional Limits for tasks assessed                                        General Comments General comments (skin integrity, edema, etc.): pt has history of one fall - when left leg gave out.  today educated him on safe use of RW to help him in event leg gets weak    Exercises Total Joint Exercises Ankle Circles/Pumps: AROM;15 reps Quad Sets: Both   Assessment/Plan    PT Assessment Patient needs continued PT services  PT Problem List Decreased strength;Decreased mobility;Decreased safety awareness;Decreased knowledge of precautions;Obesity;Decreased activity tolerance;Decreased knowledge of use of DME;Pain       PT Treatment Interventions DME instruction;Therapeutic activities;Gait training;Therapeutic exercise;Stair training;Functional mobility training    PT Goals (Current goals can be found in the Care Plan section)  Acute Rehab PT Goals Patient Stated Goal: to be abel to walk and move without pain PT Goal Formulation: With patient Time For Goal Achievement: 08/22/18 Potential to Achieve Goals: Good    Frequency Min 6X/week   Barriers to discharge   pt reports his parents are available to help at DC.  pt reports a RW will work well in his home    Co-evaluation               AM-PAC PT "6 Clicks" Daily Activity  Outcome Measure Difficulty turning over in bed (including adjusting bedclothes, sheets and blankets)?: A Little Difficulty moving from lying on back to sitting on the side of the bed? : A Little Difficulty sitting down on and standing up from a chair with arms (e.g., wheelchair, bedside commode, etc,.)?: Unable Help needed moving to and from a bed to chair (including a  wheelchair)?: A Little Help needed walking in hospital room?: A Little Help needed climbing 3-5 steps with a railing? : A Lot 6 Click Score: 15    End of Session   Activity Tolerance: Patient limited by pain;Patient tolerated treatment well Patient left: in bed Nurse Communication: Mobility status;Precautions PT Visit Diagnosis: Unsteadiness on feet (R26.81);History of falling (Z91.81);Pain    Time: 0930-1000 PT Time Calculation (min) (ACUTE ONLY): 30 min   Charges:   PT Evaluation $PT Eval Moderate Complexity: 1 Mod PT Treatments $Gait Training: 8-22 mins        08/15/2018   Ranae Palms, PT  Judson Roch 08/15/2018, 10:18 AM

## 2018-08-15 NOTE — Progress Notes (Signed)
RT assessed pt and offered to place on CPAP. Pt applies CPAP himself. RT will continue to monitor.

## 2018-08-15 NOTE — Progress Notes (Signed)
Progress Note    Angel Pope  ZOX:[id number] DOB: [birth year]  DOA: 08/14/2018 PCP: Mariel Craft, FNP    Brief Narrative:     Medical records reviewed and are as summarized below:  Angel Pope is an 42 y.o. male morbidly obese, with past medical history significant for chronic back pain, radiculopathy, disc disease and protrusion.  Patient is known to an Ecologist based out of [place], [place].  According to the patient, he developed cramps in both legs with associated severe back pain about 2 days ago after he bent down to pick up a stick from the ground.  Patient took potassiums supplement and felt a bit better.  The back pain reocurred and the patient was brought to this hospital by his father.  MRI was considered, but patient was felt to be too big for the MRI.  The patient was asked to follow-up with his primary care provider.  While at the PCPs office, patient reported having lost bowel and urinary function, with resultant diarrhea and loss of urinary control.  Patient's primary care provider's office advised patient to go to [facility] for further assessment and management.  The patient underwent CT myelogram at [facility] as there were concerns for possible cauda equina syndrome.  On discharge from the [facility], patient had another episode where he lost control of his bowel and urine.  Patient's back pain continued yesterday, and patient reported his left lower extremity gave way resulting in a fall.  Patient represents to this hospital today with history of fall and severe back pain.  ER provider has ruled out any emergent neurological deficit, and has asked the hospitalist service to admit patient for pain control.  Assessment/Plan:   Active Problems:   Low back pain  Uncontrolled Back pain s/p fall: Lidocaine patch. Decadron taper Schedule flexoril and NSAIDs  Fentanyl patch  Increase PO oxycodone PRN -PT eval again in the  AM -try to limit IV Pain meds  Morbid obesity: Body mass index is 68.77 kg/m. Significant weight loss will help with back pain control.  Lumbar radiculopathy/degenerative disc disease: Orthopedic surgery is already following the patient in [facility] PT to mobilize  Diabetes mellitus: -resume PO home meds -SSI -monitor closely while getting steroids    Family Communication/Anticipated D/C date and plan/Code Status   DVT prophylaxis: Lovenox ordered. Code Status: Full Code.  Family Communication: none Disposition Plan: pending pain and mobility improvement   Medical Consultants:      Subjective:   Worked with PT and now back is hurting  Objective:    Vitals:   08/14/18 0515 08/14/18 0600 08/14/18 0757 08/15/18 0327  BP: (!) 123/59 131/73 (!) 138/92 112/61  Pulse: 81 92 83 84  Resp: 18 (!) 24 18   Temp:   98.2 F (36.8 C) 98 F (36.7 C)  TempSrc:   Oral Oral  SpO2: 97% 97% 100% 99%  Weight:      Height:        Intake/Output Summary (Last 24 hours) at 08/15/2018 1304 Last data filed at 08/15/2018 1059 Gross per 24 hour  Intake 600 ml  Output 4130 ml  Net -3530 ml   Filed Weights   08/14/18 0337  Weight: (!) 230 kg    Exam: In bed, NAD-- just finished working with PT rrr Obese No increased work of breathing   Data Reviewed:   I have personally reviewed following labs and imaging studies:  Labs: Labs show the following:  Basic Metabolic Panel: Recent Labs  Lab 08/14/18 0433  NA 135  K 4.7  CL 102  CO2 24  GLUCOSE 274*  BUN 13  CREATININE 0.67  CALCIUM 9.0   GFR Estimated Creatinine Clearance: 240.6 mL/min (by C-G formula based on SCr of 0.67 mg/dL). Liver Function Tests: No results for input(s): AST, ALT, ALKPHOS, BILITOT, PROT, ALBUMIN in the last 168 hours. No results for input(s): LIPASE, AMYLASE in the last 168 hours. No results for input(s): AMMONIA in the last 168 hours. Coagulation profile No results for  input(s): INR, PROTIME in the last 168 hours.  CBC: Recent Labs  Lab 08/14/18 0433  WBC 7.3  HGB 14.4  HCT 43.7  MCV 84.4  PLT 267   Cardiac Enzymes: No results for input(s): CKTOTAL, CKMB, CKMBINDEX, TROPONINI in the last 168 hours. BNP (last 3 results) No results for input(s): PROBNP in the last 8760 hours. CBG: Recent Labs  Lab 08/14/18 1211 08/14/18 1623 08/14/18 2204 08/15/18 0647 08/15/18 1129  GLUCAP 243* 194* 218* 114* 124*   D-Dimer: No results for input(s): DDIMER in the last 72 hours. Hgb A1c: No results for input(s): HGBA1C in the last 72 hours. Lipid Profile: No results for input(s): CHOL, HDL, LDLCALC, TRIG, CHOLHDL, LDLDIRECT in the last 72 hours. Thyroid function studies: No results for input(s): TSH, T4TOTAL, T3FREE, THYROIDAB in the last 72 hours.  Invalid input(s): FREET3 Anemia work up: No results for input(s): VITAMINB12, FOLATE, FERRITIN, TIBC, IRON, RETICCTPCT in the last 72 hours. Sepsis Labs: Recent Labs  Lab 08/14/18 0433  WBC 7.3    Microbiology No results found for this or any previous visit (from the past 240 hour(s)).  Procedures and diagnostic studies:  No results found.  Medications:   . canagliflozin  100 mg Oral QAC breakfast  . cyclobenzaprine  10 mg Oral TID  . enoxaparin (LOVENOX) injection  40 mg Subcutaneous Q24H  . [START ON 08/17/2018] fentaNYL  25 mcg Transdermal Q72H  . gabapentin  600 mg Oral TID  . glipiZIDE  5 mg Oral Daily  . ibuprofen  400 mg Oral TID  . insulin aspart  0-15 Units Subcutaneous TID WC  . insulin aspart  0-5 Units Subcutaneous QHS  . insulin glargine  10 Units Subcutaneous BID  . lidocaine  1 patch Transdermal Q24H  . methylPREDNISolone  4 mg Oral PC lunch  . methylPREDNISolone  4 mg Oral PC supper  . [START ON 08/16/2018] methylPREDNISolone  4 mg Oral 3 x daily with food  . [START ON 08/17/2018] methylPREDNISolone  4 mg Oral 4X daily taper  . methylPREDNISolone  8 mg Oral AC breakfast   . methylPREDNISolone  8 mg Oral Nightly  . [START ON 08/16/2018] methylPREDNISolone  8 mg Oral Nightly  . nortriptyline  25 mg Oral QHS  . venlafaxine XR  225 mg Oral QHS   Continuous Infusions:   LOS: 0 days   Joseph Art  [facility]   *Please refer to [email or web address], password [email or web address] to get updated schedule on who will round on this patient, as hospitalists switch teams weekly. If 7PM-7AM, please contact night-coverage at [email or web address], password [email or web address] for any overnight needs.  08/15/2018, 1:04 PM

## 2018-08-15 NOTE — Progress Notes (Signed)
08/09/2018  2  07/27/2018  Fentanyl 25 Mcg/hr Patch  10.00 30 Ja Lea  [id number]  Nor (3235)  0/0 60.00 MME Comm Ins  [place]  08/01/2018  2  07/27/2018  Oxycodone Hcl 10 Mg Tablet  120.00 30 Ja Lea  [id number]  Nor (3235)  0/0 60.00 MME Comm Ins  [place]  07/31/2018  2  04/27/2018  Testosterone Cyp 200 Mg/ml  3.00 84 Ra Swa  [id number]  [place] (3235)  0/0  Comm Ins  [place]  07/10/2018  2  07/07/2018  Fentanyl 25 Mcg/hr Patch  10.00 30 Marshall Cork  [id number]  Nor (3235)  0/0 60.00 MME Comm Ins  [place]  07/03/2018  1  07/01/2018  Oxycodone Hcl 10 Mg Tablet  120.00 30 La Wil  [id number]  Nor (7152)  0/0 60.00 MME Comm Ins  [place]  07/01/2018  1  07/01/2018  Hydromorphone 2 Mg Tablet  30.00 7 La Wil  [id number]  Nor (7152)  0/0 34.29 MME Comm Ins  [place]  06/10/2018  1  05/29/2018  Fentanyl 25 Mcg/hr Patch  10.00 30 [place]  [id number]  Nor (7152)  0/0 60.00 MME Comm Ins  [place]  06/03/2018  1  05/29/2018  Oxycodone Hcl 10 Mg Tablet  120.00 30 [place]  [id number]  Nor (7152)  0/0 60.00 MME Comm Ins  [place]  05/11/2018  1  04/30/2018  Fentanyl 25 Mcg/hr Patch  10.00 30 Marshall Cork  [id number]  Nor (7152)  0/0 60.00 MME Comm Ins  [place]  05/04/2018  1  04/30/2018  Oxycodone Hcl 10 Mg Tablet  120.00 30 Marshall Cork  [id number]  Nor (7152)  0/0 60.00 MME Comm Ins  [place]  04/27/2018  1  04/27/2018  Testosterone Cyp 200 Mg/ml  3.00 84 Ra Swa  [id number]  [place] (7152)  0/0  Comm Ins  [place]  04/14/2018  1  03/27/2018  Hydromorphone 4 Mg Tablet  30.00 8 La Wil  [id number]  Nor (7152)  0/0 60.00 MME Comm Ins  [place]  04/12/2018  1  03/31/2018  Fentanyl 25 Mcg/hr Patch  10.00 30 Ja Urs  [id number]  Nor (7152)  0/0 60.00 MME Comm Ins  [place]  04/04/2018  1  03/31/2018  Oxycodone Hcl 10 Mg Tablet  120.00 30 [place]  [id number]  Nor (7152)  0/0 60.00 MME Comm Ins  [place]  03/13/2018  1  03/05/2018  Fentanyl 25 Mcg/hr Patch  10.00 30 La [place]  [id number]  Nor (7152)  0/0 60.00 MME Comm Ins  [place]  03/05/2018  1  03/05/2018  Oxycodone Hcl 10 Mg Tablet  120.00 30 [place]  [id number]  Nor (7152)  0/0 60.00 MME Comm Ins  [place]  02/11/2018   1  02/11/2018  Fentanyl 25 Mcg/hr Patch  10.00 30 Marshall Cork  [id number]  Nor (7152)  0/0 60.00 MME Comm Ins  [place]  02/08/2018  1  01/21/2018  Oxycodone Hcl 10 Mg Tablet  60.00 30 [place]  [id number]  Nor (7152)  0/0 30.00 MME Comm Ins  [place]  01/26/2018  1  01/21/2018  Fentanyl 25 Mcg/hr Patch  5.00 15 [place]  [id number]  Nor (7152)  0/0 60.00 MME Comm Ins  [place]  01/11/2018  1  12/30/2017  Fentanyl 25 Mcg/hr Patch  5.00 15 La [place]  [id number]  Nor (7152)  0/0 60.00 MME Comm Ins    01/09/2018  1  12/30/2017  Oxycodone Hcl 10 Mg Tablet  60.00 30  Arie Sabina  [id number]  [place] [phone])        [place] board of pharmacy

## 2018-08-15 NOTE — Plan of Care (Signed)
  Problem: Education: Goal: Knowledge of General Education information will improve Description Including pain rating scale, medication(s)/side effects and non-pharmacologic comfort measures Outcome: Progressing   Problem: Nutrition: Goal: Adequate nutrition will be maintained Outcome: Progressing   Problem: Pain Managment: Goal: General experience of comfort will improve Outcome: Progressing   

## 2018-08-15 NOTE — Plan of Care (Signed)

## 2018-08-16 DIAGNOSIS — Z794 Long term (current) use of insulin: Secondary | ICD-10-CM | POA: Diagnosis not present

## 2018-08-16 DIAGNOSIS — M5442 Lumbago with sciatica, left side: Secondary | ICD-10-CM | POA: Diagnosis not present

## 2018-08-16 DIAGNOSIS — E1169 Type 2 diabetes mellitus with other specified complication: Secondary | ICD-10-CM

## 2018-08-16 LAB — GLUCOSE, CAPILLARY
GLUCOSE-CAPILLARY: 142 mg/dL — AB (ref 70–99)
Glucose-Capillary: 96 mg/dL (ref 70–99)

## 2018-08-16 MED ORDER — FENTANYL 25 MCG/HR TD PT72: 25.0000 ug | MEDICATED_PATCH | TRANSDERMAL | 0 refills | Status: DC

## 2018-08-16 MED ORDER — OXYCODONE HCL 5 MG PO TABS: 5.0000 mg | ORAL_TABLET | ORAL | 0 refills | Status: DC | PRN

## 2018-08-16 MED ORDER — CYCLOBENZAPRINE HCL 10 MG PO TABS: 10.0000 mg | ORAL_TABLET | Freq: Three times a day (TID) | ORAL | 0 refills | Status: DC | PRN

## 2018-08-16 NOTE — Care Management Note (Signed)
Case Management Note  Patient Details  Name: Angel Pope MRN: [id number] Date of Birth: [birth year]  Subjective/Objective:                    Action/Plan:  Referral placed for outpatient PT, updated AVS. Bariatric RW and 3/1 ordered and referral placed to [facility] for delivery to room prior to DC.   Expected Discharge Date:  08/16/18               Expected Discharge Plan:  Home/Self Care  In-House Referral:     Discharge planning Services  CM Consult  Post Acute Care Choice:  Home Health, Durable Medical Equipment Choice offered to:  Patient  DME Arranged:  3-N-1, Walker rolling DME Agency:  [facility]  HH Arranged:    HH Agency:     Status of Service:  Completed, signed off  If discussed at Long Length of Stay Meetings, dates discussed:    Additional Comments:  Lawerance Sabal, RN 08/16/2018, 2:57 PM

## 2018-08-16 NOTE — Progress Notes (Signed)
Pt discharge paperwork gone over in detail with patient. All medications gone over in detail as well. Pt discharged to home with 3-in-1 and Rolling walker. Pt discharged to home with Father, by way of wheelchair. VSS. Printed prescriptions in hand.   Delories Heinz, RN

## 2018-08-16 NOTE — Discharge Instructions (Signed)
Do not drive while taking muscle relaxer and/or pain medication Do not take flexeril and xanaflex together- be cautious with your sedating medications Bowel regimen while getting pain medications

## 2018-08-16 NOTE — Discharge Summary (Signed)
Physician Discharge Summary  Angel Pope:[id number] DOB: 1975/12/14 DOA: 08/14/2018  PCP: Angel Craft, FNP  Admit date: 08/14/2018 Discharge date: 08/16/2018  Admitted From: home Discharge disposition: home   Recommendations for Outpatient Follow-Up:   1. Outpatient PT 2. DME ordered   Discharge Diagnosis:   Active Problems:   Morbid obesity (HCC)   Low back pain   Type 2 diabetes mellitus with other specified complication [facility])    Discharge Condition: Improved.  Diet recommendation: Low sodium, heart healthy.  Carbohydrate-modified.   Wound care: None.  Code status: Full.   History of Present Illness:   Patient is a 42 year old male, morbidly obese, with past medical history significant for chronic back pain, radiculopathy, disc disease and protrusion.  Patient is known to an Ecologist based out of [place], [place].  According to the patient, he developed cramps in both legs with associated severe back pain about 2 days ago after he bent down to pick up a stick from the ground.  Patient took potassiums supplement and felt a bit better.  The back pain reocurred and the patient was brought to this hospital by his father.  MRI was considered, but patient was felt to be too big for the MRI.  The patient was asked to follow-up with his primary care provider.  While at the PCPs office, patient reported having lost bowel and urinary function, with resultant diarrhea and loss of urinary control.  Patient's primary care provider's office advised patient to go to [facility] for further assessment and management.  The patient underwent CT myelogram at [facility] as there were concerns for possible cauda equina syndrome.  On discharge from the [facility], patient had another episode where he lost control of his bowel and urine.  Patient's back pain continued yesterday, and patient reported his left lower extremity gave way resulting  in a fall.  Patient represents to this hospital today with history of fall and severe back pain.  ER provider has ruled out any emergent neurological deficit, and has asked the hospitalist service to admit patient for pain control.    Hospital Course by Problem:   Uncontrolled Back pain s/p fall: Lidocaine patch. Decadron taper NSAIDS/flexeril Fentanyl patch  Increase PO oxycodone PRN   Morbid obesity: Body mass index is 68.77 kg/m. Significant weight loss will help with back pain control.  Lumbar radiculopathy/degenerative disc disease: Orthopedic surgery is already following the patient in [facility] PT to mobilize-- DME equipment and outpatient PT  Diabetesmellitus: -resume  home meds     Medical Consultants:      Discharge Exam:   Vitals:   08/16/18 0502 08/16/18 1358  BP: 118/67 (!) 129/56  Pulse: 70 90  Resp:  20  Temp: 97.7 F (36.5 C)   SpO2: 97% 96%   Vitals:   08/15/18 1949 08/15/18 2114 08/16/18 0502 08/16/18 1358  BP: (!) 107/46  118/67 (!) 129/56  Pulse: 70 71 70 90  Resp:    20  Temp: 98.3 F (36.8 C)  97.7 F (36.5 C)   TempSrc: Oral  Oral   SpO2: 97% 97% 97% 96%  Weight:      Height:        General exam: Appears calm and comfortable.      The results of significant diagnostics from this hospitalization (including imaging, microbiology, ancillary and laboratory) are listed below for reference.     Procedures and Diagnostic Studies:   No results found.  Labs:   Basic Metabolic Panel: Recent Labs  Lab 08/14/18 0433  NA 135  K 4.7  CL 102  CO2 24  GLUCOSE 274*  BUN 13  CREATININE 0.67  CALCIUM 9.0   GFR Estimated Creatinine Clearance: 240.6 mL/min (by C-G formula based on SCr of 0.67 mg/dL). Liver Function Tests: No results for input(s): AST, ALT, ALKPHOS, BILITOT, PROT, ALBUMIN in the last 168 hours. No results for input(s): LIPASE, AMYLASE in the last 168 hours. No results for input(s): AMMONIA in the last  168 hours. Coagulation profile No results for input(s): INR, PROTIME in the last 168 hours.  CBC: Recent Labs  Lab 08/14/18 0433  WBC 7.3  HGB 14.4  HCT 43.7  MCV 84.4  PLT 267   Cardiac Enzymes: No results for input(s): CKTOTAL, CKMB, CKMBINDEX, TROPONINI in the last 168 hours. BNP: Invalid input(s): POCBNP CBG: Recent Labs  Lab 08/15/18 1129 08/15/18 1741 08/15/18 2158 08/16/18 0621 08/16/18 1207  GLUCAP 124* 115* 218* 142* 96   D-Dimer No results for input(s): DDIMER in the last 72 hours. Hgb A1c No results for input(s): HGBA1C in the last 72 hours. Lipid Profile No results for input(s): CHOL, HDL, LDLCALC, TRIG, CHOLHDL, LDLDIRECT in the last 72 hours. Thyroid function studies No results for input(s): TSH, T4TOTAL, T3FREE, THYROIDAB in the last 72 hours.  Invalid input(s): FREET3 Anemia work up No results for input(s): VITAMINB12, FOLATE, FERRITIN, TIBC, IRON, RETICCTPCT in the last 72 hours. Microbiology No results found for this or any previous visit (from the past 240 hour(s)).   Discharge Instructions:   Discharge Instructions    Ambulatory referral to Physical Therapy   Complete by:  As directed    Diet - low sodium heart healthy   Complete by:  As directed    Diet Carb Modified   Complete by:  As directed    Discharge instructions   Complete by:  As directed    Consider following up in either [facility] weight loss of with [facility] weight loss at [facility] PT   Increase activity slowly   Complete by:  As directed      Allergies as of 08/16/2018      Reactions   Ciprofloxacin Hives   Aspartame And Phenylalanine Hives, Nausea And Vomiting   Headache   Acetaminophen Itching   Sulfamethoxazole-trimethoprim Itching      Medication List    STOP taking these medications   naproxen 500 MG tablet Commonly known as:  NAPROSYN   predniSONE 20 MG tablet Commonly known as:  DELTASONE     TAKE these  medications   cyclobenzaprine 10 MG tablet Commonly known as:  FLEXERIL Take 1 tablet (10 mg total) by mouth 3 (three) times daily as needed for muscle spasms.   FARXIGA 10 MG Tabs tablet Generic drug:  dapagliflozin propanediol Take 10 mg by mouth daily.   fentaNYL 25 MCG/HR patch Commonly known as:  DURAGESIC - dosed mcg/hr Place 1 patch (25 mcg total) onto the skin every 3 (three) days. What changed:  when to take this   gabapentin 300 MG capsule Commonly known as:  NEURONTIN Take 600 mg by mouth 3 (three) times daily.   glipiZIDE 5 MG tablet Commonly known as:  GLUCOTROL Take 5 mg by mouth daily.   hydrOXYzine 10 MG tablet Commonly known as:  ATARAX/VISTARIL Take 10 mg by mouth 3 (three) times daily.   ibuprofen 200 MG tablet Commonly known as:  ADVIL,MOTRIN Take  400 mg by mouth every 6 (six) hours as needed for moderate pain.   insulin aspart 100 UNIT/ML injection Commonly known as:  novoLOG Inject 20-32 Units into the skin 3 (three) times daily before meals. Sliding scale   lidocaine 5 % ointment Commonly known as:  XYLOCAINE Apply 1 application topically daily as needed. What changed:  Another medication with the same name was removed. Continue taking this medication, and follow the directions you see here.   metFORMIN 1000 MG tablet Commonly known as:  GLUCOPHAGE Take 1,000 mg by mouth 2 (two) times daily with a meal.   methylPREDNISolone 4 MG Tbpk tablet Commonly known as:  MEDROL DOSEPAK Take 4 mg by mouth.   nortriptyline 25 MG capsule Commonly known as:  PAMELOR Take 25 mg by mouth at bedtime.   oxyCODONE 5 MG immediate release tablet Commonly known as:  Oxy IR/ROXICODONE Take 1-2 tablets (5-10 mg total) by mouth every 4 (four) hours as needed for severe pain. What changed:  how much to take   OZEMPIC (1 MG/DOSE) 2 MG/1.5ML Sopn Generic drug:  Semaglutide (1 MG/DOSE) Inject 1 mL as directed once a week.   testosterone cypionate 200 MG/ML  injection Commonly known as:  DEPOTESTOSTERONE CYPIONATE Inject 0.5 mLs into the muscle every 14 (fourteen) days.   tiZANidine 2 MG tablet Commonly known as:  ZANAFLEX Take 2 mg by mouth every 8 (eight) hours.   TRESIBA FLEXTOUCH 200 UNIT/ML Sopn Generic drug:  Insulin Degludec Inject 100 Units into the skin daily.   venlafaxine XR 75 MG 24 hr capsule Commonly known as:  EFFEXOR-XR Take 225 mg by mouth at bedtime.            Durable Medical Equipment  (From admission, onward)         Start     Ordered   08/16/18 1450  For home use only DME 3 n 1  Once    Comments:  Bariatric 230 KG   08/16/18 1450   08/16/18 1450  For home use only DME Walker rolling  Once    Comments:  Bariatric, 230 KG  Question:  Patient needs a walker to treat with the following condition  Answer:  Weakness   08/16/18 1450         Follow-up Information    Swaringen, Raegan J, FNP Follow up in 1 week(s).   Specialty:  Family Medicine Contact information: [address] [place] [address] [phone]        Willaim Bane A, DO Follow up.   Specialty:  Orthopedic Surgery Contact information: [address] [place] [place] [address] [phone]        [facility] Follow up.   Specialty:  Rehabilitation Why:  they will call you this week to schedule your physical therapy Contact information: [address] [id number] mc [place] [address] [phone]       [facility] - Dme Follow up.   Why:  RW and 3/1 to be delivered to room prior to [employer] information: [address] [place] [address] [phone]            Time coordinating discharge: 35 min  Signed:  Joseph Art  [facility] 08/16/2018, 3:15 PM

## 2018-08-16 NOTE — Progress Notes (Signed)
Physical Therapy Treatment Patient Details Name: Angel Pope MRN: [id number] DOB: 09-05-1976 Today's Date: 08/16/2018    History of Present Illness Patient is a 42 year old male, morbidly obese, with past medical history significant for chronic back pain, radiculopathy, disc disease and protrusion.  Patient is known to an Ecologist based out of [place], [place].  According to the patient, he developed cramps in both legs with associated severe back pain about 2 days ago after he bent down to pick up a stick from the ground. Pt has had left leg weakness and incontinent bladder and bowels - when pain severe    PT Comments    Pt did much better today - he walked 100 feet in the halls. He did bed mobity and transfers on his own.  Pt reminded of body mechanics and back precautions.  Talked about waht he can and cannot do.  Pt abel to tolerate sitting in recliner for 20 minute sessions.  Much improved from yesterday - back pain 5-6/10 through out session.  Pt would be best treated for his back pain at OPPT center (he has been getting therapy for his cervical and thoracic back).  If he can tolerate getting to OP therapy - would be his next best option.   Follow Up Recommendations  Outpatient PT;Supervision for mobility/OOB     Equipment Recommendations  (bariatric RW and 3 n 1 )    Recommendations for Other Services       Precautions / Restrictions Precautions Precautions: Fall;Back Precaution Comments: pt educated on body mechanics and back precautions Restrictions Weight Bearing Restrictions: No    Mobility  Bed Mobility Overal bed mobility: Modified Independent             General bed mobility comments: pt given cues to prevent any twisting when getting up. pt did well - able to do it on his own - once reminded of technque.    Transfers Overall transfer level: Needs assistance Equipment used: Rolling walker (2 wheeled) Transfers: Sit to/from Stand Sit to  Stand: Min guard         General transfer comment: pt given cues for hand placement.  bed raised to help pt get up from higher surface.  pt remembered to look up to help keep back straighter with sit to stand and stand to sit  Ambulation/Gait Ambulation/Gait assistance: +2 safety/equipment Gait Distance (Feet): 100 Feet Assistive device: Rolling walker (2 wheeled) Gait Pattern/deviations: Step-through pattern     General Gait Details: pt did much better walking today - able to walk in the halls.  We followed with the chair for safety but not needed.  pt reminded of posture.  pt feels both legs equally strong   Stairs Stairs: (reviewed verbal technque for steps - which leg to go up and down with on his 2 steps if feelign weak)           Wheelchair Mobility    Modified Rankin (Stroke Patients Only)       Balance                                            Cognition Arousal/Alertness: Awake/alert Behavior During Therapy: WFL for tasks assessed/performed Overall Cognitive Status: Within Functional Limits for tasks assessed  Exercises      General Comments General comments (skin integrity, edema, etc.): pt steady today      Pertinent Vitals/Pain Pain Assessment: 0-10 Pain Score: 6  Pain Location: low back Pain Descriptors / Indicators: Aching;Burning;Guarding;Grimacing;Tightness Pain Intervention(s): Limited activity within patient's tolerance;Monitored during session;Repositioned    Home Living                      Prior Function            PT Goals (current goals can now be found in the care plan section) Progress towards PT goals: Progressing toward goals    Frequency    Min 6X/week      PT Plan Current plan remains appropriate    Co-evaluation              AM-PAC PT "6 Clicks" Daily Activity  Outcome Measure  Difficulty turning over in bed (including  adjusting bedclothes, sheets and blankets)?: None Difficulty moving from lying on back to sitting on the side of the bed? : A Little Difficulty sitting down on and standing up from a chair with arms (e.g., wheelchair, bedside commode, etc,.)?: A Little Help needed moving to and from a bed to chair (including a wheelchair)?: A Little Help needed walking in hospital room?: A Little Help needed climbing 3-5 steps with a railing? : A Lot 6 Click Score: 18    End of Session   Activity Tolerance: Patient tolerated treatment well Patient left: in chair(with lumbar roll behind him and pt sitting upright) Nurse Communication: Mobility status PT Visit Diagnosis: Unsteadiness on feet (R26.81);History of falling (Z91.81);Pain     Time: 1610-9604 PT Time Calculation (min) (ACUTE ONLY): 20 min  Charges:  $Gait Training: 8-22 mins                     08/16/2018   Ranae Palms, PT    Judson Roch 08/16/2018, 9:57 AM

## 2020-04-13 ENCOUNTER — Encounter (HOSPITAL_COMMUNITY): Payer: Self-pay

## 2020-04-13 ENCOUNTER — Emergency Department (HOSPITAL_COMMUNITY)
Admission: EM | Admit: 2020-04-13 | Discharge: 2020-04-13 | Disposition: A | Discharge status: Home / Self Care | Payer: BC Managed Care – PPO | Attending: Emergency Medicine | Admitting: Emergency Medicine

## 2020-04-13 ENCOUNTER — Emergency Department (HOSPITAL_COMMUNITY): Payer: BC Managed Care – PPO

## 2020-04-13 ENCOUNTER — Other Ambulatory Visit: Payer: Self-pay

## 2020-04-13 DIAGNOSIS — Z9884 Bariatric surgery status: Secondary | ICD-10-CM | POA: Insufficient documentation

## 2020-04-13 DIAGNOSIS — E119 Type 2 diabetes mellitus without complications: Secondary | ICD-10-CM | POA: Insufficient documentation

## 2020-04-13 DIAGNOSIS — R1013 Epigastric pain: Secondary | ICD-10-CM | POA: Diagnosis present

## 2020-04-13 DIAGNOSIS — Z79899 Other long term (current) drug therapy: Secondary | ICD-10-CM | POA: Diagnosis not present

## 2020-04-13 DIAGNOSIS — Z794 Long term (current) use of insulin: Secondary | ICD-10-CM | POA: Diagnosis not present

## 2020-04-13 DIAGNOSIS — R1012 Left upper quadrant pain: Secondary | ICD-10-CM | POA: Diagnosis not present

## 2020-04-13 LAB — CBC
HCT: 48.7 % (ref 39.0–52.0)
Hemoglobin: 15.9 g/dL (ref 13.0–17.0)
MCH: 25.9 pg — ABNORMAL LOW (ref 26.0–34.0)
MCHC: 32.6 g/dL (ref 30.0–36.0)
MCV: 79.4 fL — ABNORMAL LOW (ref 80.0–100.0)
Platelets: 323 10*3/uL (ref 150–400)
RBC: 6.13 MIL/uL — ABNORMAL HIGH (ref 4.22–5.81)
RDW: 13.2 % (ref 11.5–15.5)
WBC: 7.9 10*3/uL (ref 4.0–10.5)
nRBC: 0.3 % — ABNORMAL HIGH (ref 0.0–0.2)

## 2020-04-13 LAB — BASIC METABOLIC PANEL
Anion gap: 12 (ref 5–15)
BUN: 15 mg/dL (ref 6–20)
CO2: 23 mmol/L (ref 22–32)
Calcium: 9.5 mg/dL (ref 8.9–10.3)
Chloride: 99 mmol/L (ref 98–111)
Creatinine, Ser: 0.91 mg/dL (ref 0.61–1.24)
GFR calc Af Amer: >60 mL/min (ref >60)
GFR calc non Af Amer: >60 mL/min (ref >60)
Glucose, Bld: 329 mg/dL — ABNORMAL HIGH (ref 70–99)
Potassium: 5.1 mmol/L (ref 3.5–5.1)
Sodium: 134 mmol/L — ABNORMAL LOW (ref 135–145)

## 2020-04-13 LAB — HEPATIC FUNCTION PANEL
ALT: 42 U/L (ref 0–44)
AST: 32 U/L (ref 15–41)
Albumin: 3.6 g/dL (ref 3.5–5.0)
Alkaline Phosphatase: 64 U/L (ref 38–126)
Bilirubin, Direct: <0.1 mg/dL (ref 0.0–0.2)
Total Bilirubin: 0.6 mg/dL (ref 0.3–1.2)
Total Protein: 7.5 g/dL (ref 6.5–8.1)

## 2020-04-13 LAB — TROPONIN I (HIGH SENSITIVITY)
Troponin I (High Sensitivity): 5 ng/L (ref <18)
Troponin I (High Sensitivity): 5 ng/L (ref <18)

## 2020-04-13 LAB — LIPASE, BLOOD: Lipase: 25 U/L (ref 11–51)

## 2020-04-13 MED ORDER — LIDOCAINE VISCOUS HCL 2 % MT SOLN
15.0000 mL | Freq: Once | OROMUCOSAL | Status: AC
  Administered 2020-04-13: 15 mL via ORAL
  Filled 2020-04-13: qty 15

## 2020-04-13 MED ORDER — PANTOPRAZOLE SODIUM 40 MG PO TBEC: 40.0000 mg | DELAYED_RELEASE_TABLET | Freq: Once | ORAL | Status: DC

## 2020-04-13 MED ORDER — ALUM & MAG HYDROXIDE-SIMETH 200-200-20 MG/5ML PO SUSP
30.0000 mL | Freq: Once | ORAL | Status: AC
  Administered 2020-04-13: 30 mL via ORAL
  Filled 2020-04-13: qty 30

## 2020-04-13 MED ORDER — SODIUM CHLORIDE 0.9% FLUSH
3.0000 mL | Freq: Once | INTRAVENOUS | Status: AC
  Administered 2020-04-13: 3 mL via INTRAVENOUS

## 2020-04-13 MED ORDER — IOHEXOL 300 MG/ML  SOLN
100.0000 mL | Freq: Once | INTRAMUSCULAR | Status: AC | PRN
  Administered 2020-04-13: 100 mL via INTRAVENOUS

## 2020-04-13 MED ORDER — FAMOTIDINE 20 MG PO TABS: 20.0000 mg | ORAL_TABLET | Freq: Two times a day (BID) | ORAL | 0 refills | Status: AC

## 2020-04-13 MED ORDER — FAMOTIDINE 20 MG PO TABS
20.0000 mg | ORAL_TABLET | Freq: Once | ORAL | Status: AC
  Administered 2020-04-13: 20 mg via ORAL
  Filled 2020-04-13: qty 1

## 2020-04-13 MED ORDER — OXYCODONE HCL 5 MG PO TABS
10.0000 mg | ORAL_TABLET | Freq: Once | ORAL | Status: AC
  Administered 2020-04-13: 10 mg via ORAL
  Filled 2020-04-13: qty 2

## 2020-04-13 NOTE — ED Triage Notes (Signed)
Patient complains of epigastric pain that started on Sunday with radiation to back. No SOB, pain worse with movement

## 2020-04-13 NOTE — ED Provider Notes (Signed)
[facility] EMERGENCY DEPARTMENT Provider Note   CSN: [id number] Arrival date & time: 04/13/20  1447     History No chief complaint on file.   Angel Pope is a 44 y.o. male.  Patient with history of chronic back pain, on chronic pain medications -- presents the emergency department with 4 days of left upper quadrant pain with radiation to the back.  Patient has a history of LAP-BAND surgery.  He states that this was completely deflated several years ago and he has not had any adjustment since.  He states that the pain is in the area where his lap band should be.  He states that is about a softball sized area in the left upper quadrant.  He denies any associated fevers, chest pain, shortness of breath, nausea, vomiting, diarrhea, constipation.  He states that he has been trying to eat a clear diet and has not noted any changes with eating.  No urinary symptoms.        Past Medical History:  Diagnosis Date  . Anal fissure   . Anxiety   . BPH (benign prostatic hyperplasia)   . Depression    sees Dr. Archer Asa   . Fever of unknown origin    seeing Dr. Enedina Finner   . Morbid obesity (HCC)   . Nausea    chronic- Dr Leone Payor  . Overactive bladder   . Overactive bladder   . Pain, low back   . Plantar fasciitis   . Sleep apnea    uses cpap  . Ureterolithiasis     Patient Active Problem List   Diagnosis Date Noted  . Type 2 diabetes mellitus with other specified complication (HCC) 08/16/2018  . Low back pain 08/14/2018  . Morbid obesity (HCC) 03/26/2012  . CHRONIC PROSTATITIS 01/08/2011  . FEVER UNSPECIFIED 01/08/2011  . ABDOMINAL PAIN RIGHT UPPER QUADRANT 05/15/2010  . DIARRHEA-PRESUMED INFECTIOUS 07/31/2009  . ANAL FISSURE 07/31/2009  . HEMATEMESIS 07/31/2009  . NAUSEA AND VOMITING 07/31/2009  . VOMITING 07/31/2009  . DYSPHAGIA UNSPECIFIED 07/31/2009  . DYSPHAGIA 07/31/2009  . GERD 07/25/2009  . HEADACHE 07/19/2009  . HEMORRHOID,  THROMBOSED 05/15/2009  . OVERACTIVE BLADDER 12/15/2007  . ANXIETY 08/03/2007  . DEPRESSION 08/03/2007  . LOW BACK PAIN 08/03/2007  . SLEEP APNEA 08/03/2007    Past Surgical History:  Procedure Laterality Date  . ANAL SPHINCTEROTOMY     dr Luisa Hart 06/04/09  . APPENDECTOMY    . CERVICAL FUSION  2017  . ESOPHAGOGASTRODUODENOSCOPY     dr Leone Payor - normal  . lab band  06/06/2008   dr Johna Sheriff       Family History  Problem Relation Age of Onset  . Cancer Father        colon  . Depression Other   . Hypertension Other   . Diabetes Other   . COPD Other        adrenal tumors  . Cystic fibrosis Other        paternall cousins  . Inflammatory bowel disease Other        paternall aunt    Social History   Tobacco Use  . Smoking status: Never Smoker  . Smokeless tobacco: Never Used  Vaping Use  . Vaping Use: Never used  Substance Use Topics  . Alcohol use: No  . Drug use: No    Home Medications Prior to Admission medications   Medication Sig Start Date End Date Taking? Authorizing Provider  clotrimazole-betamethasone (LOTRISONE) cream Apply  1 application topically daily as needed (itching).  02/23/20  Yes [provider]  ESZOPICLONE 3 MG tablet Take 3 mg by mouth at bedtime.  03/23/20  Yes [provider]  FARXIGA 10 MG TABS tablet Take 10 mg by mouth every evening.  08/09/18  Yes [provider]  hydrOXYzine (ATARAX/VISTARIL) 10 MG tablet Take 10 mg by mouth 3 (three) times daily. 10/01/17  Yes [provider]  insulin aspart protamine- aspart (NOVOLOG MIX 70/30) (70-30) 100 UNIT/ML injection Inject 16 Units into the skin in the morning, at noon, and at bedtime. Sliding Scale   Yes [provider]  lithium carbonate (LITHOBID) 300 MG CR tablet Take 300 mg by mouth 2 (two) times daily. 03/20/20  Yes [provider]  metFORMIN (GLUCOPHAGE-XR) 500 MG 24 hr tablet Take 500 mg by mouth 2 (two) times daily. 03/27/20  Yes [provider]  MYRBETRIQ 25 MG TB24 tablet Take 25 mg by mouth every evening.  02/27/20  Yes [provider]  nortriptyline (PAMELOR) 25 MG capsule Take 25 mg by mouth at bedtime. 08/12/18  Yes [provider]  nystatin (MYCOSTATIN/NYSTOP) powder Apply 1 application topically daily as needed (rash).  03/25/20  Yes [provider]  ondansetron (ZOFRAN) 4 MG tablet Take 4 mg by mouth every 8 (eight) hours as needed for nausea or vomiting.  03/23/20  Yes [provider]  OZEMPIC, 1 MG/DOSE, 2 MG/1.5ML SOPN Inject 1 mL as directed once a week. Take on Tues 08/09/18  Yes [provider]  QUEtiapine (SEROQUEL) 400 MG tablet Take 600 mg by mouth at bedtime.  03/23/20  Yes [provider]  TRESIBA FLEXTOUCH 200 UNIT/ML SOPN Inject 100 Units into the skin every evening.  10/03/17  Yes [provider]  venlafaxine XR (EFFEXOR-XR) 75 MG 24 hr capsule Take 75 mg by mouth at bedtime.  04/19/16  Yes [provider]  XYOSTED 75 MG/0.5ML SOAJ Inject 75 mg into the skin once a week. Take on Tues 03/27/20  Yes [provider]  betamethasone dipropionate 0.05 % cream  02/24/20   [provider]  chlorhexidine (PERIDEX) 0.12 % solution  03/28/20   [provider]  cyclobenzaprine (FLEXERIL) 10 MG tablet Take 1 tablet (10 mg total) by mouth 3 (three) times daily as needed for muscle spasms. 08/16/18   Geradine Girt, DO  fentaNYL (DURAGESIC - DOSED MCG/HR) 25 MCG/HR patch Place 1 patch (25 mcg total) onto the skin every 3 (three) days. 08/16/18   Geradine Girt, DO  gabapentin (NEURONTIN) 300 MG capsule Take 600 mg by mouth 3 (three) times daily.  10/06/17   [provider]  glipiZIDE (GLUCOTROL) 5 MG tablet Take 5 mg by mouth daily. 07/31/18   [provider]  hydrOXYzine (ATARAX/VISTARIL) 50 MG tablet  03/23/20   [provider]  ibuprofen (ADVIL,MOTRIN) 200 MG tablet Take 400 mg by mouth every 6 (six)  hours as needed for moderate pain.     [provider]  insulin aspart (NOVOLOG) 100 UNIT/ML injection Inject 20-32 Units into the skin 3 (three) times daily before meals. Sliding scale    [provider]  LAMICTAL 200 MG tablet Take 200 mg by mouth daily. 03/23/20   [provider]  lidocaine (XYLOCAINE) 5 % ointment Apply 1 application topically daily as needed. 07/14/18   [provider]  omeprazole (PRILOSEC) 20 MG capsule Take 20 mg by mouth 2 (two) times daily. 03/26/20   [provider]  oxyCODONE (ROXICODONE) 5 MG immediate release tablet Take 1-2 tablets (5-10 mg total) by mouth every 4 (four) hours as needed for severe pain. 08/16/18   Joseph Art, DO  Oxycodone HCl 10 MG TABS Take 10 mg by mouth 4 (four) times daily as needed. 03/28/20   [provider]  testosterone cypionate (DEPOTESTOSTERONE CYPIONATE) 200 MG/ML injection Inject 0.5 mLs into the muscle every 14 (fourteen) days. 04/27/18 04/27/19  [provider]  tiZANidine (ZANAFLEX) 2 MG tablet Take 2 mg by mouth every 8 (eight) hours. 08/12/18   [provider]  tiZANidine (ZANAFLEX) 4 MG tablet  03/25/20   [provider]  venlafaxine XR (EFFEXOR-XR) 150 MG 24 hr capsule Take 150 mg by mouth daily. Patient not taking: Reported on 04/13/2020 03/27/20   [provider]    Allergies    Ciprofloxacin, Aspartame and phenylalanine, Acetaminophen, and Sulfamethoxazole-trimethoprim  Review of Systems   Review of Systems  Constitutional: Negative for fever.  HENT: Negative for rhinorrhea and sore throat.   Eyes: Negative for redness.  Respiratory: Negative for cough.   Cardiovascular: Negative for chest pain.  Gastrointestinal: Positive for abdominal pain. Negative for constipation, diarrhea, nausea and vomiting.  Genitourinary: Negative for dysuria.  Musculoskeletal: Negative for myalgias.  Skin: Negative for rash.  Neurological: Negative for  headaches.    Physical Exam Updated Vital Signs BP (!) 140/97   Pulse 88   Temp 98.6 F (37 C)   Resp 18   SpO2 98%   Physical Exam Vitals and nursing note reviewed.  Constitutional:      Appearance: He is well-developed.  HENT:     Head: Normocephalic and atraumatic.  Eyes:     General:        Right eye: No discharge.        Left eye: No discharge.     Conjunctiva/sclera: Conjunctivae normal.  Cardiovascular:     Rate and Rhythm: Normal rate and regular rhythm.     Heart sounds: Normal heart sounds.  Pulmonary:     Effort: Pulmonary effort is normal.     Breath sounds: Normal breath sounds.  Abdominal:     Palpations: Abdomen is soft.     Tenderness: There is abdominal tenderness. There is no guarding or rebound.     Comments: Patient with mild to moderate tenderness in the left upper quadrant without rebound or guarding.  I cannot palpate LAP-BAND port.  Musculoskeletal:     Cervical back: Normal range of motion and neck supple.  Skin:    General: Skin is warm and dry.  Neurological:     Mental Status: He is alert.     ED Results / Procedures / Treatments   Labs (all labs ordered are listed, but only abnormal results are displayed) Labs Reviewed  BASIC METABOLIC PANEL - Abnormal; Notable for the following components:      Result Value   Sodium 134 (*)    Glucose, Bld 329 (*)    All other components within normal limits  CBC - Abnormal; Notable for the following components:   RBC 6.13 (*)    MCV 79.4 (*)    MCH 25.9 (*)    nRBC 0.3 (*)    All other components within normal limits  LIPASE, BLOOD  HEPATIC FUNCTION PANEL  TROPONIN I (HIGH SENSITIVITY)  TROPONIN I (HIGH SENSITIVITY)    ED ECG REPORT   Date: 04/13/2020  Rate: 92  Rhythm: normal sinus rhythm  QRS  Axis: left  Intervals: normal  ST/T Wave abnormalities: normal  Conduction Disutrbances:left anterior fascicular block  Narrative Interpretation:   Old EKG Reviewed: unchanged from  08/14/2018  I have personally reviewed the EKG tracing and agree with the computerized printout as noted.  Radiology DG Chest 2 View  Result Date: 04/13/2020 CLINICAL DATA:  Chest and abdominal pain for several days EXAM: CHEST - 2 VIEW COMPARISON:  None. FINDINGS: The heart size and mediastinal contours are within normal limits. Both lungs are clear. The visualized skeletal structures are unremarkable. IMPRESSION: No active cardiopulmonary disease. Electronically Signed   By: Alcide Clever M.D.   On: 04/13/2020 16:07    Procedures Procedures (including critical care time)  Medications Ordered in ED Medications  sodium chloride flush (NS) 0.9 % injection 3 mL (3 mLs Intravenous Given 04/13/20 1854)  oxyCODONE (Oxy IR/ROXICODONE) immediate release tablet 10 mg (10 mg Oral Given 04/13/20 1834)  iohexol (OMNIPAQUE) 300 MG/ML solution 100 mL (100 mLs Intravenous Contrast Given 04/13/20 1956)  alum & mag hydroxide-simeth (MAALOX/MYLANTA) 200-200-20 MG/5ML suspension 30 mL (30 mLs Oral Given 04/13/20 2103)    And  lidocaine (XYLOCAINE) 2 % viscous mouth solution 15 mL (15 mLs Oral Given 04/13/20 2103)  famotidine (PEPCID) tablet 20 mg (20 mg Oral Given 04/13/20 2103)    ED Course  I have reviewed the triage vital signs and the nursing notes.  Pertinent labs & imaging results that were available during my care of the patient were reviewed by me and considered in my medical decision making (see chart for details).  Patient seen and examined. Work-up initiated. Medications ordered.  We will proceed with imaging given patient's history of lap band and location of pain.  His lab work is reassuring.  Patient agrees to proceed.  Vital signs reviewed and are as follows: BP (!) 143/85   Pulse 83   Temp 98.6 F (37 C) (Oral)   Resp 18   Ht 6\' 4"  (1.93 m)   Wt (!) 216.8 kg   SpO2 97%   BMI 58.18 kg/m   CT imaging read reviewed.  LAP-BAND is in appropriate position.  There are no other acute  explanations for the patient's symptoms.    Patient updated on results.  He has been taking omeprazole for about a month.  He will continue this.  I will add Pepcid.  Will give GI cocktail prior to discharge.  Patient is already on chronic pain medications at home.  Strongly encourage PCP follow-up for recheck.  Discussed bland diet.  The patient was urged to return to the Emergency Department immediately with worsening of current symptoms, worsening abdominal pain, persistent vomiting, blood noted in stools, fever, or any other concerns. The patient verbalized understanding.      MDM Rules/Calculators/A&P                          Patient with abdominal pain, left upper quadrant, history of lap band surgery.  No obstructive symptoms today. Vitals are stable, no fever. Labs reassuring. Imaging without acute findings, stable LAP-BAND. No signs of dehydration, patient is tolerating PO's. Lungs are clear and no signs suggestive of PNA.  I suspect patient's pain is of a GI etiology.  He will continue omeprazole and add short course of Pepcid.  Low concern for appendicitis, cholecystitis, pancreatitis, ruptured viscus, UTI, kidney stone, aortic dissection, aortic aneurysm or other emergent abdominal etiology. Supportive therapy indicated with return if symptoms worsen.  Final Clinical Impression(s) / ED Diagnoses Final diagnoses:  Left upper quadrant pain    Rx / DC Orders ED Discharge Orders         Ordered    famotidine (PEPCID) 20 MG tablet  2 times daily     Discontinue  Reprint     04/13/20 2059           Renne CriglerGeiple, Tiann Saha, PA-C 04/13/20 2120    Sabas SousBero, Michael M, MD 04/14/20 [phone]

## 2020-04-13 NOTE — Discharge Instructions (Signed)
Please read and follow all provided instructions.  Your diagnoses today include:  1. Left upper quadrant pain     Tests performed today include: Blood counts and electrolytes Blood tests to check liver and kidney function Blood tests to check pancreas function Blood test for heart irritation - were normal CT of your abdomen and pelvis - no acute findings, LAP band looks good Vital signs. See below for your results today.   Medications prescribed:  Pepcid (famotidine) - antihistamine  You can find this medication over-the-counter.   DO NOT exceed:  20mg  Pepcid every 12 hours  Take any prescribed medications only as directed.  Home care instructions:  Follow any educational materials contained in this packet. Continue taking the omeprazole and your chronic pain medications.  Follow-up instructions: Please follow-up with your primary care provider in the next 3 days for further evaluation of your symptoms.    Return instructions:  SEEK IMMEDIATE MEDICAL ATTENTION IF: The pain does not go away or becomes severe  A temperature above 101F develops  Repeated vomiting occurs (multiple episodes)  The pain becomes localized to portions of the abdomen. The right side could possibly be appendicitis. In an adult, the left lower portion of the abdomen could be colitis or diverticulitis.  Blood is being passed in stools or vomit (bright red or black tarry stools)  You develop chest pain, difficulty breathing, dizziness or fainting, or become confused, poorly responsive, or inconsolable (young children) If you have any other emergent concerns regarding your health  Additional Information: Abdominal (belly) pain can be caused by many things. Your caregiver performed an examination and possibly ordered blood/urine tests and imaging (CT scan, x-rays, ultrasound). Many cases can be observed and treated at home after initial evaluation in the emergency department. Even though you are being  discharged home, abdominal pain can be unpredictable. Therefore, you need a repeated exam if your pain does not resolve, returns, or worsens. Most patients with abdominal pain don't have to be admitted to the hospital or have surgery, but serious problems like appendicitis and gallbladder attacks can start out as nonspecific pain. Many abdominal conditions cannot be diagnosed in one visit, so follow-up evaluations are very important.  Your vital signs today were: BP (!) 146/88   Pulse 84   Temp 98.6 F (37 C)   Resp 15   Ht 6\' 4"  (1.93 m)   Wt (!) 216.8 kg   SpO2 97%   BMI 58.18 kg/m  If your blood pressure (bp) was elevated above 135/85 this visit, please have this repeated by your doctor within one month. --------------

## 2020-04-13 NOTE — ED Notes (Signed)
Patient verbalizes understanding of discharge instructions. Opportunity for questioning and answers were provided. Armband removed by staff, pt discharged from ED to home 

## 2021-08-06 ENCOUNTER — Other Ambulatory Visit: Payer: Self-pay

## 2021-08-06 ENCOUNTER — Emergency Department (HOSPITAL_COMMUNITY): Payer: BC Managed Care – PPO

## 2021-08-06 ENCOUNTER — Emergency Department (HOSPITAL_COMMUNITY)
Admission: EM | Admit: 2021-08-06 | Discharge: 2021-08-07 | Disposition: A | Discharge status: Home / Self Care | Payer: BC Managed Care – PPO | Attending: Emergency Medicine | Admitting: Emergency Medicine

## 2021-08-06 DIAGNOSIS — Z5321 Procedure and treatment not carried out due to patient leaving prior to being seen by health care provider: Secondary | ICD-10-CM | POA: Diagnosis not present

## 2021-08-06 DIAGNOSIS — S0081XA Abrasion of other part of head, initial encounter: Secondary | ICD-10-CM | POA: Diagnosis not present

## 2021-08-06 DIAGNOSIS — R109 Unspecified abdominal pain: Secondary | ICD-10-CM | POA: Insufficient documentation

## 2021-08-06 DIAGNOSIS — R4189 Other symptoms and signs involving cognitive functions and awareness: Secondary | ICD-10-CM | POA: Diagnosis not present

## 2021-08-06 DIAGNOSIS — S40812A Abrasion of left upper arm, initial encounter: Secondary | ICD-10-CM | POA: Insufficient documentation

## 2021-08-06 DIAGNOSIS — R296 Repeated falls: Secondary | ICD-10-CM | POA: Insufficient documentation

## 2021-08-06 DIAGNOSIS — W19XXXA Unspecified fall, initial encounter: Secondary | ICD-10-CM | POA: Diagnosis not present

## 2021-08-06 DIAGNOSIS — S0990XA Unspecified injury of head, initial encounter: Secondary | ICD-10-CM | POA: Diagnosis present

## 2021-08-06 DIAGNOSIS — S40811A Abrasion of right upper arm, initial encounter: Secondary | ICD-10-CM | POA: Insufficient documentation

## 2021-08-06 DIAGNOSIS — R4182 Altered mental status, unspecified: Secondary | ICD-10-CM | POA: Insufficient documentation

## 2021-08-06 LAB — COMPREHENSIVE METABOLIC PANEL
ALT: 20 U/L (ref 0–44)
AST: 23 U/L (ref 15–41)
Albumin: 3.9 g/dL (ref 3.5–5.0)
Alkaline Phosphatase: 69 U/L (ref 38–126)
Anion gap: 13 (ref 5–15)
BUN: 21 mg/dL — ABNORMAL HIGH (ref 6–20)
CO2: 21 mmol/L — ABNORMAL LOW (ref 22–32)
Calcium: 9.3 mg/dL (ref 8.9–10.3)
Chloride: 97 mmol/L — ABNORMAL LOW (ref 98–111)
Creatinine, Ser: 1.44 mg/dL — ABNORMAL HIGH (ref 0.61–1.24)
GFR, Estimated: >60 mL/min (ref >60)
Glucose, Bld: 313 mg/dL — ABNORMAL HIGH (ref 70–99)
Potassium: 4.5 mmol/L (ref 3.5–5.1)
Sodium: 131 mmol/L — ABNORMAL LOW (ref 135–145)
Total Bilirubin: 0.5 mg/dL (ref 0.3–1.2)
Total Protein: 7.9 g/dL (ref 6.5–8.1)

## 2021-08-06 LAB — CBC WITH DIFFERENTIAL/PLATELET
Abs Immature Granulocytes: 0.04 10*3/uL (ref 0.00–0.07)
Basophils Absolute: 0.1 10*3/uL (ref 0.0–0.1)
Basophils Relative: 0 %
Eosinophils Absolute: 0.1 10*3/uL (ref 0.0–0.5)
Eosinophils Relative: 1 %
HCT: 48.7 % (ref 39.0–52.0)
Hemoglobin: 15.5 g/dL (ref 13.0–17.0)
Immature Granulocytes: 0 %
Lymphocytes Relative: 32 %
Lymphs Abs: 4.3 10*3/uL — ABNORMAL HIGH (ref 0.7–4.0)
MCH: 26 pg (ref 26.0–34.0)
MCHC: 31.8 g/dL (ref 30.0–36.0)
MCV: 81.6 fL (ref 80.0–100.0)
Monocytes Absolute: 0.8 10*3/uL (ref 0.1–1.0)
Monocytes Relative: 6 %
Neutro Abs: 8.4 10*3/uL — ABNORMAL HIGH (ref 1.7–7.7)
Neutrophils Relative %: 61 %
Platelets: 329 10*3/uL (ref 150–400)
RBC: 5.97 MIL/uL — ABNORMAL HIGH (ref 4.22–5.81)
RDW: 14.2 % (ref 11.5–15.5)
WBC: 13.7 10*3/uL — ABNORMAL HIGH (ref 4.0–10.5)
nRBC: 0 % (ref 0.0–0.2)

## 2021-08-06 LAB — URINALYSIS, ROUTINE W REFLEX MICROSCOPIC
Bilirubin Urine: NEGATIVE
Glucose, UA: >=500 mg/dL — AB
Ketones, ur: NEGATIVE mg/dL
Nitrite: NEGATIVE
Protein, ur: 30 mg/dL — AB
Specific Gravity, Urine: 1.03 (ref 1.005–1.030)
pH: 5 (ref 5.0–8.0)

## 2021-08-06 LAB — RAPID URINE DRUG SCREEN, HOSP PERFORMED
Amphetamines: NOT DETECTED
Barbiturates: NOT DETECTED
Benzodiazepines: NOT DETECTED
Cocaine: NOT DETECTED
Opiates: NOT DETECTED
Tetrahydrocannabinol: NOT DETECTED

## 2021-08-06 LAB — ETHANOL: Alcohol, Ethyl (B): <10 mg/dL (ref <10)

## 2021-08-06 LAB — AMMONIA: Ammonia: 26 umol/L (ref 9–35)

## 2021-08-06 NOTE — ED Triage Notes (Signed)
Pt has letter from his mom that states pt has hx of cognitive issues and suffering from frequent falls and balance issues. Has multiple bruises and abrasions noted from his falls.

## 2021-08-06 NOTE — ED Provider Notes (Signed)
Emergency Medicine Provider Triage Evaluation Note  Angel Pope , a 45 y.o. male  was evaluated in triage.  Pt complains of altered mental status and decreased cognitive ability.  Patient is unable to give much history.  He comes with a note which was written by his mother.  Patient complains of abdominal pain and multiple falls.  Review of Systems  Positive: Abrasions Negative: Fevers  Physical Exam  There were no vitals taken for this visit. Gen:   Awake, no distress   Resp:  Normal effort  MSK:   Moves extremities without difficulty  Other:  Patient has multiple linear scrapes and abrasions over his face and upper extremities, no focal strength deficits  Medical Decision Making  Medically screening exam initiated at 3:33 PM.  Appropriate orders placed.  Angel Pope was informed that the remainder of the evaluation will be completed by another provider, this initial triage assessment does not replace that evaluation, and the importance of remaining in the ED until their evaluation is complete.     Renne Crigler, PA-C 08/06/21 1534    Bethann Berkshire, MD 08/11/21 1120

## 2021-08-07 NOTE — ED Notes (Signed)
Patient denied water/snack when offered

## 2021-08-07 NOTE — ED Notes (Signed)
Patient stated he had to leave to go to [place] to be seen, patient was encouraged to stay. Patient left.

## 2021-09-06 ENCOUNTER — Encounter (HOSPITAL_COMMUNITY): Payer: Self-pay | Admitting: Radiology

## 2021-09-13 ENCOUNTER — Encounter (HOSPITAL_COMMUNITY): Payer: Self-pay | Admitting: Radiology

## 2021-09-13 ENCOUNTER — Ambulatory Visit: Payer: Self-pay | Admitting: *Deleted

## 2021-09-13 NOTE — Telephone Encounter (Signed)
Patient called in to say that he received a letter of significant findings have reached out to his PCP but no help need a call back at Ph# [phone].  Pt states he did alert PCP and they were not able to see results/imaging. Contacted my director advised to  reach out to Rodney Booze for resolution of issue. Pt made aware  Advised to follow up with PCP and to CB for any additional issues.

## 2022-10-19 ENCOUNTER — Emergency Department (HOSPITAL_COMMUNITY)
Admission: EM | Admit: 2022-10-19 | Discharge: 2022-10-20 | Disposition: A | Discharge status: Home / Self Care | Payer: BC Managed Care – PPO | Attending: Emergency Medicine | Admitting: Emergency Medicine

## 2022-10-19 ENCOUNTER — Emergency Department (HOSPITAL_COMMUNITY): Payer: BC Managed Care – PPO

## 2022-10-19 ENCOUNTER — Other Ambulatory Visit: Payer: Self-pay

## 2022-10-19 ENCOUNTER — Encounter (HOSPITAL_COMMUNITY): Payer: Self-pay

## 2022-10-19 DIAGNOSIS — M5412 Radiculopathy, cervical region: Secondary | ICD-10-CM | POA: Diagnosis not present

## 2022-10-19 DIAGNOSIS — Z794 Long term (current) use of insulin: Secondary | ICD-10-CM | POA: Diagnosis not present

## 2022-10-19 DIAGNOSIS — M542 Cervicalgia: Secondary | ICD-10-CM | POA: Diagnosis present

## 2022-10-19 LAB — COMPREHENSIVE METABOLIC PANEL
ALT: 13 U/L (ref 0–44)
AST: 18 U/L (ref 15–41)
Albumin: 3.3 g/dL — ABNORMAL LOW (ref 3.5–5.0)
Alkaline Phosphatase: 70 U/L (ref 38–126)
Anion gap: 6 (ref 5–15)
BUN: 16 mg/dL (ref 6–20)
CO2: 28 mmol/L (ref 22–32)
Calcium: 8.9 mg/dL (ref 8.9–10.3)
Chloride: 102 mmol/L (ref 98–111)
Creatinine, Ser: 1.01 mg/dL (ref 0.61–1.24)
GFR, Estimated: >60 mL/min (ref >60)
Glucose, Bld: 195 mg/dL — ABNORMAL HIGH (ref 70–99)
Potassium: 4.8 mmol/L (ref 3.5–5.1)
Sodium: 136 mmol/L (ref 135–145)
Total Bilirubin: 0.4 mg/dL (ref 0.3–1.2)
Total Protein: 7.3 g/dL (ref 6.5–8.1)

## 2022-10-19 LAB — CBC WITH DIFFERENTIAL/PLATELET
Abs Immature Granulocytes: 0.03 10*3/uL (ref 0.00–0.07)
Basophils Absolute: 0 10*3/uL (ref 0.0–0.1)
Basophils Relative: 0 %
Eosinophils Absolute: 0 10*3/uL (ref 0.0–0.5)
Eosinophils Relative: 0 %
HCT: 48 % (ref 39.0–52.0)
Hemoglobin: 15.5 g/dL (ref 13.0–17.0)
Immature Granulocytes: 0 %
Lymphocytes Relative: 27 %
Lymphs Abs: 2.3 10*3/uL (ref 0.7–4.0)
MCH: 28 pg (ref 26.0–34.0)
MCHC: 32.3 g/dL (ref 30.0–36.0)
MCV: 86.6 fL (ref 80.0–100.0)
Monocytes Absolute: 0.3 10*3/uL (ref 0.1–1.0)
Monocytes Relative: 4 %
Neutro Abs: 5.8 10*3/uL (ref 1.7–7.7)
Neutrophils Relative %: 69 %
Platelets: 297 10*3/uL (ref 150–400)
RBC: 5.54 MIL/uL (ref 4.22–5.81)
RDW: 14.6 % (ref 11.5–15.5)
WBC: 8.4 10*3/uL (ref 4.0–10.5)
nRBC: 0 % (ref 0.0–0.2)

## 2022-10-19 LAB — APTT: aPTT: 24 seconds (ref 24–36)

## 2022-10-19 LAB — ETHANOL: Alcohol, Ethyl (B): <10 mg/dL (ref <10)

## 2022-10-19 LAB — PROTIME-INR
INR: 1.1 (ref 0.8–1.2)
Prothrombin Time: 14.3 seconds (ref 11.4–15.2)

## 2022-10-19 MED ORDER — PREDNISONE 10 MG PO TABS: 20.0000 mg | ORAL_TABLET | Freq: Every day | ORAL | 0 refills | Status: AC

## 2022-10-19 MED ORDER — DIAZEPAM 5 MG PO TABS
10.0000 mg | ORAL_TABLET | Freq: Once | ORAL | Status: AC
  Administered 2022-10-19: 10 mg via ORAL
  Filled 2022-10-19: qty 2

## 2022-10-19 MED ORDER — TIZANIDINE HCL 4 MG PO TABS: 4.0000 mg | ORAL_TABLET | Freq: Four times a day (QID) | ORAL | 0 refills | Status: AC | PRN

## 2022-10-19 MED ORDER — KETOROLAC TROMETHAMINE 60 MG/2ML IM SOLN: 60.0000 mg | Freq: Once | INTRAMUSCULAR | Status: DC

## 2022-10-19 MED ORDER — DEXAMETHASONE SODIUM PHOSPHATE 10 MG/ML IJ SOLN
10.0000 mg | Freq: Once | INTRAMUSCULAR | Status: AC
  Administered 2022-10-19: 10 mg via INTRAVENOUS
  Filled 2022-10-19: qty 1

## 2022-10-19 MED ORDER — OXYCODONE HCL 5 MG PO TABS
5.0000 mg | ORAL_TABLET | Freq: Once | ORAL | Status: AC
  Administered 2022-10-19: 5 mg via ORAL
  Filled 2022-10-19: qty 1

## 2022-10-19 NOTE — ED Notes (Signed)
Patient transported to CT 

## 2022-10-19 NOTE — ED Notes (Signed)
Pt is requesting something for pain. Message sent to provider reference same

## 2022-10-19 NOTE — ED Triage Notes (Signed)
Pt arrived POV from home c/o upper back pain that radiates into his neck x3 days. Pt has some compressed vertebrae but the pain is now worse.

## 2022-10-19 NOTE — ED Notes (Signed)
Received verbal report from Danielle G RN at this time 

## 2022-10-19 NOTE — ED Provider Triage Note (Signed)
Emergency Medicine Provider Triage Evaluation Note  Angel Pope , a 46 y.o. male  was evaluated in triage.  Pt complains of lower neck/upper back pain. Patient reports history of symptoms occurring over the past 3 days. This morning, he noted weakness in his left upper and left lower extremity greater in upper extremity. He also noted numbness in both affected extremities. Denies visual deficits from baseline, slurring of speech, drooping face. .  Review of Systems  Positive: See above Negative:   Physical Exam  BP (!) 148/54   Pulse 89   Temp 98.9 F (37.2 C)   Resp 18   Ht 6\' 4"  (1.93 m)   Wt (!) 183.3 kg   SpO2 100%   BMI 49.18 kg/m  Gen:   Awake, no distress   Resp:  Normal effort  MSK:   Moves extremities without difficulty  Other:  Cranial 3 through 12 grossly intact.  Patient with weakness appreciated in grip, extension and flexion of left elbow.  Patient also complaining of sensory deficits along major nerve distributions of upper extremity.  Similarly experience weakness in ankle dorsi/plantarflexion, knee flexion and extension on the left side with similar sensory deficits.  Medical Decision Making  Medically screening exam initiated at 1:21 PM.  Appropriate orders placed.  LESLIE LANGILLE was informed that the remainder of the evaluation will be completed by another provider, this initial triage assessment does not replace that evaluation, and the importance of remaining in the ED until their evaluation is complete.     Luvenia Heller, Peter Garter 10/19/22 1322

## 2022-10-19 NOTE — ED Provider Notes (Signed)
[facility] EMERGENCY DEPARTMENT Provider Note   CSN: [id number] Arrival date & time: 10/19/22  1227     History Chief Complaint  Patient presents with   Back Pain   Neck Pain    Angel Pope is a 46 y.o. male.   Back Pain Associated symptoms: numbness and weakness   Associated symptoms: no abdominal pain, no chest pain, no fever and no headaches   Neck Pain Associated symptoms: numbness and weakness   Associated symptoms: no chest pain, no fever and no headaches   Patient presents to the ER with worsening neck pain with radiating tingling, numbness, and weakness into arms and left leg. Patient previously had C5-6 discectomy with fusion in 2016 and reports that his symptoms improved greatly since then until a few days ago. No obvious injury prior to symptom onset. Patient feels that symptoms are more pronounced on left side with altered gait due to weakness in left leg and feeling of pins and needles there. Feels that neck is "grinding down" on itself. Patient also verbalized that he has been having some difficulty with urination as he feels that he can't push as he normally would, but doesn't feel that he is unable to empty bladder at this time. No concerns with bowel incontinence. Patient denies headache, fever, vision changes, loss of function, hemineglect, facial droop, or slurred speech.     Home Medications Prior to Admission medications   Medication Sig Start Date End Date Taking? Authorizing Provider  predniSONE (DELTASONE) 10 MG tablet Take 2 tablets (20 mg total) by mouth daily for 5 days. 10/19/22 10/24/22 Yes Smitty Knudsen, PA-C  tiZANidine (ZANAFLEX) 4 MG tablet Take 1 tablet (4 mg total) by mouth every 6 (six) hours as needed for up to 15 days for muscle spasms. 10/19/22 11/03/22 Yes Maryanna Shape A, PA-C  betamethasone dipropionate 0.05 % cream Apply 1 application topically daily as needed (itching).  02/24/20   [provider]   chlorhexidine (PERIDEX) 0.12 % solution Use as directed 15 mLs in the mouth or throat daily.  03/28/20   [provider]  clotrimazole-betamethasone (LOTRISONE) cream Apply 1 application topically daily as needed (itching).  02/23/20   [provider]  ESZOPICLONE 3 MG tablet Take 3 mg by mouth at bedtime.  03/23/20   [provider]  famotidine (PEPCID) 20 MG tablet Take 1 tablet (20 mg total) by mouth 2 (two) times daily. 04/13/20   Renne Crigler, PA-C  FARXIGA 10 MG TABS tablet Take 10 mg by mouth every evening.  08/09/18   [provider]  fentaNYL (DURAGESIC - DOSED MCG/HR) 25 MCG/HR patch Place 1 patch (25 mcg total) onto the skin every 3 (three) days. 08/16/18   Joseph Art, DO  gabapentin (NEURONTIN) 300 MG capsule Take 600 mg by mouth 3 (three) times daily.  10/06/17   [provider]  hydrOXYzine (ATARAX/VISTARIL) 10 MG tablet Take 10 mg by mouth 3 (three) times daily. 10/01/17   [provider]  hydrOXYzine (ATARAX/VISTARIL) 50 MG tablet Take 50 mg by mouth at bedtime as needed for anxiety.  03/23/20   [provider]  insulin aspart protamine- aspart (NOVOLOG MIX 70/30) (70-30) 100 UNIT/ML injection Inject 16 Units into the skin in the morning, at noon, and at bedtime. Sliding Scale    [provider]  LAMICTAL 200 MG tablet Take 200 mg by mouth at bedtime.  03/23/20   [provider]  lidocaine (XYLOCAINE) 5 % ointment  Apply 1 application topically daily as needed for mild pain or moderate pain.  07/14/18   [provider]  lithium carbonate (LITHOBID) 300 MG CR tablet Take 300 mg by mouth 2 (two) times daily. 03/20/20   [provider]  metFORMIN (GLUCOPHAGE-XR) 500 MG 24 hr tablet Take 500 mg by mouth 2 (two) times daily. 03/27/20   [provider]  MYRBETRIQ 25 MG TB24 tablet Take 25 mg by mouth every evening.  02/27/20   [provider]  nortriptyline (PAMELOR) 25 MG capsule  Take 25 mg by mouth at bedtime. 08/12/18   [provider]  nystatin (MYCOSTATIN/NYSTOP) powder Apply 1 application topically daily as needed (rash).  03/25/20   [provider]  omeprazole (PRILOSEC) 20 MG capsule Take 20 mg by mouth 2 (two) times daily as needed (indigestion).  03/26/20   [provider]  ondansetron (ZOFRAN) 4 MG tablet Take 4 mg by mouth every 8 (eight) hours as needed for nausea or vomiting.  03/23/20   [provider]  Oxycodone HCl 10 MG TABS Take 10 mg by mouth 4 (four) times daily as needed (pain).  03/28/20   [provider]  OZEMPIC, 1 MG/DOSE, 2 MG/1.5ML SOPN Inject 1 mL as directed once a week. Take on Tues 08/09/18   [provider]  QUEtiapine (SEROQUEL) 400 MG tablet Take 600 mg by mouth at bedtime.  03/23/20   [provider]  testosterone cypionate (DEPOTESTOSTERONE CYPIONATE) 200 MG/ML injection Inject 0.5 mLs into the muscle every 14 (fourteen) days. 04/27/18 04/27/19  [provider]  tiZANidine (ZANAFLEX) 4 MG tablet Take 4-6 mg by mouth every 6 (six) hours as needed for muscle spasms.  03/25/20   [provider]  TRESIBA FLEXTOUCH 200 UNIT/ML SOPN Inject 100 Units into the skin every evening.  10/03/17   [provider]  venlafaxine XR (EFFEXOR-XR) 75 MG 24 hr capsule Take 75 mg by mouth at bedtime.  04/19/16   [provider]  XYOSTED 75 MG/0.5ML SOAJ Inject 75 mg into the skin once a week. Take on Tues 03/27/20   [provider]      Allergies    Ciprofloxacin, Aspartame and phenylalanine, Acetaminophen, and Sulfamethoxazole-trimethoprim    Review of Systems   Review of Systems  Constitutional:  Negative for chills and fever.  Eyes:  Negative for visual disturbance.  Respiratory:  Negative for chest tightness and shortness of breath.   Cardiovascular:  Negative for chest pain.  Gastrointestinal:  Negative for abdominal pain and constipation.   Musculoskeletal:  Positive for back pain, neck pain and neck stiffness.  Neurological:  Positive for weakness and numbness. Negative for dizziness, syncope, facial asymmetry, speech difficulty, light-headedness and headaches.  All other systems reviewed and are negative.   Physical Exam Updated Vital Signs BP (!) 147/77 (BP Location: Left Arm)   Pulse 82   Temp 98 F (36.7 C) (Oral)   Resp 20   Ht 6\' 4"  (1.93 m)   Wt (!) 183.3 kg   SpO2 99%   BMI 49.18 kg/m  Physical Exam Vitals and nursing note reviewed.  Constitutional:      Appearance: Normal appearance. He is obese. He is not ill-appearing or toxic-appearing.  HENT:     Head: Normocephalic and atraumatic.  Eyes:     General: No visual field deficit.    Conjunctiva/sclera: Conjunctivae normal.  Neck:     Comments: Limited ROM due to pain. Cardiovascular:     Rate and  Rhythm: Normal rate and regular rhythm.     Pulses: Normal pulses.     Heart sounds: Normal heart sounds.  Pulmonary:     Effort: Pulmonary effort is normal.     Breath sounds: Normal breath sounds.  Abdominal:     General: There is no distension.  Musculoskeletal:        General: No swelling, tenderness, deformity or signs of injury.     Cervical back: Rigidity present.  Skin:    General: Skin is warm and dry.     Capillary Refill: Capillary refill takes less than 2 seconds.  Neurological:     Mental Status: He is alert.     Cranial Nerves: No facial asymmetry.     Sensory: Sensory deficit present.     Motor: Weakness present. No tremor.     Coordination: Coordination is intact.     Gait: Gait abnormal.     Deep Tendon Reflexes:     Reflex Scores:      Tricep reflexes are 2+ on the right side and 2+ on the left side.      Patellar reflexes are 2+ on the right side and 1+ on the left side.    Comments: Palpation of bony prominence in neck elicits tingling sensation in both arms     ED Results / Procedures / Treatments   Labs (all labs  ordered are listed, but only abnormal results are displayed) Labs Reviewed  COMPREHENSIVE METABOLIC PANEL - Abnormal; Notable for the following components:      Result Value   Glucose, Bld 195 (*)    Albumin 3.3 (*)    All other components within normal limits  CBC WITH DIFFERENTIAL/PLATELET  APTT  PROTIME-INR  ETHANOL  URINALYSIS, ROUTINE W REFLEX MICROSCOPIC  RAPID URINE DRUG SCREEN, HOSP PERFORMED    EKG None  Radiology CT Cervical Spine Wo Contrast  Result Date: 10/19/2022 CLINICAL DATA:  Cervical radiculopathy. EXAM: CT CERVICAL SPINE WITHOUT CONTRAST TECHNIQUE: Multidetector CT imaging of the cervical spine was performed without intravenous contrast. Multiplanar CT image reconstructions were also generated. RADIATION DOSE REDUCTION: This exam was performed according to the departmental dose-optimization program which includes automated exposure control, adjustment of the mA and/or kV according to patient size and/or use of iterative reconstruction technique. COMPARISON:  Cervical spine MRI 10/29/2017. FINDINGS: Alignment: Normal. Skull base and vertebrae: Anterior fusion plate at W5-Y0 with vertebral body screws. No hardware loosening. Soft tissues and spinal canal: Nondiagnostic evaluation of central spinal canal soft tissues in the lower cervical and upper thoracic region secondary to streak artifact and body habitus. Otherwise, soft tissues are within normal limits. Disc levels: Mild disc space narrowing at C6-C7 and C7-T1. There is moderate right-sided neural foraminal stenosis at C3-C4 secondary to uncovertebral spurring. No severe central canal stenosis identified given limitations of the study. Upper chest: Negative. Other: None. IMPRESSION: 1. Uncomplicated anterior fusion plate and screws at C5-C6. 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 2. Nondiagnostic evaluation of the central spinal canal soft tissues in the lower cervical  and upper thoracic region secondary to streak artifact and body habitus. Electronically Signed   By: Darliss Cheney M.D.   On: 10/19/2022 23:13    Procedures Procedures   Medications Ordered in ED Medications  dexamethasone (DECADRON) injection 10 mg (10 mg Intravenous Given 10/19/22 2218)  diazepam (VALIUM) tablet 10 mg (10 mg Oral Given 10/19/22 2218)  oxyCODONE (Oxy IR/ROXICODONE) immediate release tablet 5 mg (5 mg Oral  Given 10/19/22 2218)    ED Course/ Medical Decision Making/ A&P Clinical Course as of 10/19/22 2351  Sat Oct 19, 2022  2323 CT Cervical Spine Wo Contrast [OZ]    Clinical Course User Index [OZ] Smitty KnudsenZelaya, Kaiden Pech A, PA-C                           Medical Decision Making Amount and/or Complexity of Data Reviewed Radiology: ordered. Decision-making details documented in ED Course.  Risk Prescription drug management.   This patient presents to the ED for concern of radiating back pain, this involves an extensive number of treatment options, and is a complaint that carries with it a high risk of complications and morbidity.  The differential diagnosis includes vertebral artery dissection, arthritis of the cervical spine, cervical radiculopathy, multiple sclerosis, shingles   Co morbidities that complicate the patient evaluation  History of cervical spine discectomy and fusion   Additional history obtained:  External records from outside source obtained and reviewed including C5-6 discectomy and fusion in 2016.   Lab Tests:  I Ordered, and personally interpreted labs.  The pertinent results include:  All labwork normal with exception of elevated glucose on CMP at 195   Imaging Studies ordered:  I ordered imaging studies including CT cervical spine I independently visualized and interpreted imaging which showed right sided foraminal stenosis at C3-C4 I agree with the radiologist interpretation    Problem List / ED Course / Critical interventions /  Medication management  Neck pain with radicular symptoms I ordered medication including dexamethasone, diazepam, oxycodone for neck pain  Reevaluation of the patient after these medicines showed that the patient improved I have reviewed the patients home medicines and have made adjustments as needed Patient presented to the ER with complaints of neck pain with radicular symptoms.  He reports that the symptoms had worsened over the last 3 days and had previously experienced similar symptoms which resulted in a cervical spine discectomy.  He states that he is currently experiencing some numbness and weakness in bilateral arms as well as predominantly left-sided lower extremity weakness.  Based on exam CT imaging of the neck was performed which was reassuring but did show some evidence of foraminal stenosis which could account for some of the symptoms that patient is currently experiencing.  Advised patient of these findings and recommended that he follow-up with neurosurgery outpatient to discuss further workup and treatment.  Patient was agreeable to this plan and verbalized understanding necessary return precautions.   Final Clinical Impression(s) / ED Diagnoses Final diagnoses:  Cervical radiculopathy    Rx / DC Orders ED Discharge Orders          Ordered    predniSONE (DELTASONE) 10 MG tablet  Daily        10/19/22 2346    tiZANidine (ZANAFLEX) 4 MG tablet  Every 6 hours PRN        10/19/22 2346              Salomon MastZelaya, Shreyansh Tiffany A, PA-C 10/19/22 2351    Lorre NickAllen, Anthony, MD 10/23/22 2119

## 2022-10-19 NOTE — ED Provider Notes (Signed)
I provided a substantive portion of the care of this patient.  I personally performed the entirety of the medical decision making for this encounter.      46 year old male presents complaining of pain to his neck.  Prior history of cervical disc surgery.  States that he started new job recently and pain is characterized as sharp and worse with movement.  Has been more active recently.  On exam he has no focal weakness.  No concern for cord impingement.  Will order a CT scan to evaluate cervical disc.  Will treat with steroids and likely discharge home   Lorre Nick, MD 10/19/22 2050

## 2022-10-19 NOTE — Discharge Instructions (Addendum)
You were seen in the ER today for neck pain with radiating symptoms. Based on your symptoms, imaging of your neck was performed which was reassuring at this and not requiring emergent intervention. I strongly encourage you to follow up with neurosurgery.  I have sent in a prescription for tizanidine and prednisone. The prednisone course is for 5 days total.

## 2022-10-19 NOTE — ED Provider Triage Note (Signed)
Emergency Medicine Provider Triage Evaluation Note  Angel Pope , a 46 y.o. male  was evaluated in triage.  Pt complains of lower neck/upper back pain.  Patient reports history of symptoms occurring over the past 3 days.  This morning, he noted weakness in his left upper and left lower extremity greater in upper extremity.  He also noted numbness in both affected extremities.  Denies visual deficits from baseline, slurring of speech, drooping face.  Review of Systems  Positive: See above Negative:   Physical Exam  BP (!) 148/54   Pulse 89   Temp 98.9 F (37.2 C)   Resp 18   Ht 6\' 4"  (1.93 m)   Wt (!) 183.3 kg   SpO2 100%   BMI 49.18 kg/m  Gen:   Awake, no distress   Resp:  Normal effort  MSK:   Moves extremities without difficulty  Other:  Appreciable weakness of left upper extremity in flexion and extension of elbow as well as grip.  Appreciable weakness noted in left lower extremity and knee flexion and extension.  Patient complaining sensory deficits along major nerve distributions of left upper and lower extremity.  Medical Decision Making  Medically screening exam initiated at 1:14 PM.  Appropriate orders placed.  Angel Pope was informed that the remainder of the evaluation will be completed by another provider, this initial triage assessment does not replace that evaluation, and the importance of remaining in the ED until their evaluation is complete.     Angel Pope, Angel Pope 10/19/22 [phone]

## 2022-10-19 NOTE — ED Provider Notes (Signed)
This patient was screened in triage earlier, with concern for possible spinal cord compression, cervical radiculopathy, or other concerning neurologic finding, cannot rule out stroke, he has some objective strength weakness of the left arm and left leg compared to the right side as well as is endorsing some difficulty with urination as well as sensory deficits.  He did not fit in an MRI earlier today, however he does need further advanced imaging.  At this time will order CTA head and neck, and inform charge nurse patient's acute needs to be changed to and should have a bed soon as possible.   Angel Pope 10/19/22 1815    Lorre Nick, MD 10/23/22 2118

## 2022-11-08 ENCOUNTER — Other Ambulatory Visit: Payer: Self-pay | Admitting: Rehabilitation

## 2022-11-08 DIAGNOSIS — M5412 Radiculopathy, cervical region: Secondary | ICD-10-CM

## 2022-11-11 ENCOUNTER — Other Ambulatory Visit: Payer: Self-pay

## 2022-11-11 ENCOUNTER — Emergency Department (HOSPITAL_COMMUNITY)
Admission: EM | Admit: 2022-11-11 | Discharge: 2022-11-11 | Disposition: A | Discharge status: Home / Self Care | Payer: BC Managed Care – PPO | Attending: Emergency Medicine | Admitting: Emergency Medicine

## 2022-11-11 DIAGNOSIS — E162 Hypoglycemia, unspecified: Secondary | ICD-10-CM

## 2022-11-11 DIAGNOSIS — E11649 Type 2 diabetes mellitus with hypoglycemia without coma: Secondary | ICD-10-CM | POA: Insufficient documentation

## 2022-11-11 DIAGNOSIS — Z20822 Contact with and (suspected) exposure to covid-19: Secondary | ICD-10-CM | POA: Insufficient documentation

## 2022-11-11 DIAGNOSIS — R197 Diarrhea, unspecified: Secondary | ICD-10-CM

## 2022-11-11 DIAGNOSIS — R5383 Other fatigue: Secondary | ICD-10-CM

## 2022-11-11 DIAGNOSIS — Z794 Long term (current) use of insulin: Secondary | ICD-10-CM | POA: Insufficient documentation

## 2022-11-11 DIAGNOSIS — Z7984 Long term (current) use of oral hypoglycemic drugs: Secondary | ICD-10-CM | POA: Insufficient documentation

## 2022-11-11 DIAGNOSIS — R1011 Right upper quadrant pain: Secondary | ICD-10-CM | POA: Diagnosis present

## 2022-11-11 LAB — COMPREHENSIVE METABOLIC PANEL
ALT: 16 U/L (ref 0–44)
AST: 27 U/L (ref 15–41)
Albumin: 3.2 g/dL — ABNORMAL LOW (ref 3.5–5.0)
Alkaline Phosphatase: 59 U/L (ref 38–126)
Anion gap: 10 (ref 5–15)
BUN: 8 mg/dL (ref 6–20)
CO2: 22 mmol/L (ref 22–32)
Calcium: 8.8 mg/dL — ABNORMAL LOW (ref 8.9–10.3)
Chloride: 103 mmol/L (ref 98–111)
Creatinine, Ser: 0.88 mg/dL (ref 0.61–1.24)
GFR, Estimated: >60 mL/min (ref >60)
Glucose, Bld: 204 mg/dL — ABNORMAL HIGH (ref 70–99)
Potassium: 3.8 mmol/L (ref 3.5–5.1)
Sodium: 135 mmol/L (ref 135–145)
Total Bilirubin: 0.5 mg/dL (ref 0.3–1.2)
Total Protein: 6.7 g/dL (ref 6.5–8.1)

## 2022-11-11 LAB — URINALYSIS, ROUTINE W REFLEX MICROSCOPIC
Bilirubin Urine: NEGATIVE
Glucose, UA: >=500 mg/dL — AB
Hgb urine dipstick: NEGATIVE
Ketones, ur: NEGATIVE mg/dL
Leukocytes,Ua: NEGATIVE
Nitrite: NEGATIVE
Protein, ur: NEGATIVE mg/dL
Specific Gravity, Urine: 1.02 (ref 1.005–1.030)
pH: 5 (ref 5.0–8.0)

## 2022-11-11 LAB — LIPASE, BLOOD: Lipase: 49 U/L (ref 11–51)

## 2022-11-11 LAB — CBC
HCT: 45.8 % (ref 39.0–52.0)
Hemoglobin: 14.9 g/dL (ref 13.0–17.0)
MCH: 27.6 pg (ref 26.0–34.0)
MCHC: 32.5 g/dL (ref 30.0–36.0)
MCV: 84.8 fL (ref 80.0–100.0)
Platelets: 258 10*3/uL (ref 150–400)
RBC: 5.4 MIL/uL (ref 4.22–5.81)
RDW: 14.5 % (ref 11.5–15.5)
WBC: 8.6 10*3/uL (ref 4.0–10.5)
nRBC: 0 % (ref 0.0–0.2)

## 2022-11-11 LAB — RESP PANEL BY RT-PCR (RSV, FLU A&B, COVID)  RVPGX2
Influenza A by PCR: NEGATIVE
Influenza B by PCR: NEGATIVE
Resp Syncytial Virus by PCR: NEGATIVE
SARS Coronavirus 2 by RT PCR: NEGATIVE

## 2022-11-11 LAB — CBG MONITORING, ED
Glucose-Capillary: 225 mg/dL — ABNORMAL HIGH (ref 70–99)
Glucose-Capillary: 176 mg/dL — ABNORMAL HIGH (ref 70–99)

## 2022-11-11 NOTE — ED Triage Notes (Signed)
Patient reports LUQ abdominal pain with diarrhea this evening , no emesis or fever , patient also stated fluctuating blood sugar for the past several days .

## 2022-11-11 NOTE — ED Provider Notes (Signed)
[facility] EMERGENCY DEPARTMENT Provider Note   CSN: [id number] Arrival date & time: 11/11/22  0051     History  Chief Complaint  Patient presents with   Abdominal Pain    Angel Pope is a 47 y.o. male.  The history is provided by the patient and medical records. No language interpreter was used.  Abdominal Pain Pain location:  LUQ Pain quality: cramping   Pain radiates to:  Does not radiate Pain severity:  Mild Onset quality:  Gradual Duration:  1 day Timing:  Sporadic Progression:  Resolved Chronicity:  New Relieved by:  Nothing Worsened by:  Nothing Ineffective treatments:  None tried Associated symptoms: diarrhea and fatigue   Associated symptoms: no anorexia, no chest pain, no chills, no constipation, no cough, no dysuria, no fever, no hematemesis, no hematochezia, no hematuria, no nausea, no shortness of breath and no vomiting        Home Medications Prior to Admission medications   Medication Sig Start Date End Date Taking? Authorizing Provider  betamethasone dipropionate 0.05 % cream Apply 1 application topically daily as needed (itching).  02/24/20   [provider]  chlorhexidine (PERIDEX) 0.12 % solution Use as directed 15 mLs in the mouth or throat daily.  03/28/20   [provider]  clotrimazole-betamethasone (LOTRISONE) cream Apply 1 application topically daily as needed (itching).  02/23/20   [provider]  ESZOPICLONE 3 MG tablet Take 3 mg by mouth at bedtime.  03/23/20   [provider]  famotidine (PEPCID) 20 MG tablet Take 1 tablet (20 mg total) by mouth 2 (two) times daily. 04/13/20   Carlisle Cater, PA-C  FARXIGA 10 MG TABS tablet Take 10 mg by mouth every evening.  08/09/18   [provider]  fentaNYL (DURAGESIC - DOSED MCG/HR) 25 MCG/HR patch Place 1 patch (25 mcg total) onto the skin every 3 (three) days. 08/16/18   Geradine Girt, DO  gabapentin (NEURONTIN) 300 MG capsule Take  600 mg by mouth 3 (three) times daily.  10/06/17   [provider]  hydrOXYzine (ATARAX/VISTARIL) 10 MG tablet Take 10 mg by mouth 3 (three) times daily. 10/01/17   [provider]  hydrOXYzine (ATARAX/VISTARIL) 50 MG tablet Take 50 mg by mouth at bedtime as needed for anxiety.  03/23/20   [provider]  insulin aspart protamine- aspart (NOVOLOG MIX 70/30) (70-30) 100 UNIT/ML injection Inject 16 Units into the skin in the morning, at noon, and at bedtime. Sliding Scale    [provider]  LAMICTAL 200 MG tablet Take 200 mg by mouth at bedtime.  03/23/20   [provider]  lidocaine (XYLOCAINE) 5 % ointment Apply 1 application topically daily as needed for mild pain or moderate pain.  07/14/18   [provider]  lithium carbonate (LITHOBID) 300 MG CR tablet Take 300 mg by mouth 2 (two) times daily. 03/20/20   [provider]  metFORMIN (GLUCOPHAGE-XR) 500 MG 24 hr tablet Take 500 mg by mouth 2 (two) times daily. 03/27/20   [provider]  MYRBETRIQ 25 MG TB24 tablet Take 25 mg by mouth every evening.  02/27/20   [provider]  nortriptyline (PAMELOR) 25 MG capsule Take 25 mg by mouth at bedtime. 08/12/18   [provider]  nystatin (MYCOSTATIN/NYSTOP) powder Apply 1 application topically daily as needed (rash).  03/25/20   [provider]  omeprazole (PRILOSEC) 20 MG capsule Take 20 mg by mouth 2 (two) times daily  as needed (indigestion).  03/26/20   [provider]  ondansetron (ZOFRAN) 4 MG tablet Take 4 mg by mouth every 8 (eight) hours as needed for nausea or vomiting.  03/23/20   [provider]  Oxycodone HCl 10 MG TABS Take 10 mg by mouth 4 (four) times daily as needed (pain).  03/28/20   [provider]  OZEMPIC, 1 MG/DOSE, 2 MG/1.5ML SOPN Inject 1 mL as directed once a week. Take on Tues 08/09/18   [provider]  QUEtiapine (SEROQUEL) 400 MG tablet Take 600 mg by  mouth at bedtime.  03/23/20   [provider]  testosterone cypionate (DEPOTESTOSTERONE CYPIONATE) 200 MG/ML injection Inject 0.5 mLs into the muscle every 14 (fourteen) days. 04/27/18 04/27/19  [provider]  tiZANidine (ZANAFLEX) 4 MG tablet Take 4-6 mg by mouth every 6 (six) hours as needed for muscle spasms.  03/25/20   [provider]  TRESIBA FLEXTOUCH 200 UNIT/ML SOPN Inject 100 Units into the skin every evening.  10/03/17   [provider]  venlafaxine XR (EFFEXOR-XR) 75 MG 24 hr capsule Take 75 mg by mouth at bedtime.  04/19/16   [provider]  XYOSTED 75 MG/0.5ML SOAJ Inject 75 mg into the skin once a week. Take on Tues 03/27/20   [provider]      Allergies    Ciprofloxacin, Aspartame and phenylalanine, Acetaminophen, and Sulfamethoxazole-trimethoprim    Review of Systems   Review of Systems  Constitutional:  Positive for fatigue. Negative for chills, diaphoresis and fever.  HENT:  Negative for congestion.   Eyes:  Negative for visual disturbance.  Respiratory:  Negative for cough, chest tightness and shortness of breath.   Cardiovascular:  Negative for chest pain, palpitations and leg swelling.  Gastrointestinal:  Positive for abdominal pain and diarrhea. Negative for anorexia, constipation, hematemesis, hematochezia, nausea and vomiting.  Endocrine: Positive for polyuria (chronic per pt).  Genitourinary:  Negative for dysuria, flank pain and hematuria.  Musculoskeletal:  Negative for back pain, neck pain and neck stiffness.  Skin:  Negative for rash and wound.  Neurological:  Negative for weakness, light-headedness, numbness (at baseline with neuropathy) and headaches.  Psychiatric/Behavioral:  Negative for agitation and confusion.     Physical Exam Updated Vital Signs BP (!) 155/100 (BP Location: Left Arm)   Pulse 81   Temp 98.3 F (36.8 C)   Resp 16   SpO2 99%  Physical Exam Vitals and nursing note reviewed.   Constitutional:      General: He is not in acute distress.    Appearance: He is well-developed. He is not ill-appearing, toxic-appearing or diaphoretic.  HENT:     Head: Normocephalic and atraumatic.     Mouth/Throat:     Mouth: Mucous membranes are moist.  Eyes:     General: No scleral icterus.    Conjunctiva/sclera: Conjunctivae normal.     Pupils: Pupils are equal, round, and reactive to light.  Cardiovascular:     Rate and Rhythm: Normal rate and regular rhythm.     Heart sounds: No murmur heard. Pulmonary:     Effort: Pulmonary effort is normal. No respiratory distress.     Breath sounds: Normal breath sounds. No wheezing, rhonchi or rales.  Chest:     Chest wall: No tenderness.  Abdominal:     General: Bowel sounds are normal. There is no distension.     Palpations: Abdomen is soft.     Tenderness: There is no abdominal tenderness. There  is no right CVA tenderness, left CVA tenderness, guarding or rebound.  Musculoskeletal:        General: No swelling.     Cervical back: Neck supple.  Skin:    General: Skin is warm and dry.     Capillary Refill: Capillary refill takes less than 2 seconds.     Coloration: Skin is not pale.     Findings: No rash.  Neurological:     General: No focal deficit present.     Mental Status: He is alert.  Psychiatric:        Mood and Affect: Mood normal.     ED Results / Procedures / Treatments   Labs (all labs ordered are listed, but only abnormal results are displayed) Labs Reviewed  COMPREHENSIVE METABOLIC PANEL - Abnormal; Notable for the following components:      Result Value   Glucose, Bld 204 (*)    Calcium 8.8 (*)    Albumin 3.2 (*)    All other components within normal limits  URINALYSIS, ROUTINE W REFLEX MICROSCOPIC - Abnormal; Notable for the following components:   Glucose, UA >=500 (*)    Bacteria, UA FEW (*)    All other components within normal limits  CBG MONITORING, ED - Abnormal; Notable for the following  components:   Glucose-Capillary 225 (*)    All other components within normal limits  CBG MONITORING, ED - Abnormal; Notable for the following components:   Glucose-Capillary 176 (*)    All other components within normal limits  RESP PANEL BY RT-PCR (RSV, FLU A&B, COVID)  RVPGX2  LIPASE, BLOOD  CBC    EKG EKG Interpretation  Date/Time:  Monday November 11 2022 01:39:41 EST Ventricular Rate:  105 PR Interval:  198 QRS Duration: 130 QT Interval:  364 QTC Calculation: 481 R Axis:   -52 Text Interpretation: Sinus tachycardia Left axis deviation Left ventricular hypertrophy with QRS widening ( R in aVL , Cornell product ) Cannot rule out Septal infarct , age undetermined Abnormal ECG When compared with ECG of 13-Apr-2020 14:51, No significant change was found Confirmed by Delora Fuel ([id number]) on 11/11/2022 2:33:44 AM  Radiology No results found.  Procedures Procedures    Medications Ordered in ED Medications - No data to display  ED Course/ Medical Decision Making/ A&P                           Medical Decision Making   TREYVON VENUTI is a 47 y.o. male with a past medical history significant for type 2 diabetes, previous lap band surgery, BPH, chronic back pain, kidney stones, anxiety, depression, and sleep apnea who presents with hypoglycemia, fatigue, and diarrhea.  According to patient, since yesterday he has been having some mild left upper quadrant abdominal cramping with aching.  He reports no fevers or chills and no nausea or vomiting but has had several loose diarrheal stools since onset.  He denies any blood in the stool.  Denies any trauma.  Denies any rashes.  Denies any congestion, cough or URI symptoms but is unsure about sick contacts.  He reports he was on steroids recently for back pain but has completed those.  He continues to monitor his glucose and said that overnight his glucose was going into the 70s and 80s which is low for him.  He subsequently drank juices  and Gatorade and ate food and it improved.  He otherwise has been feeling well now and  has been here for over 8 hours waiting for evaluation.  He was seen in triage and had some workup started.  Patient denies any chest pain, palpitations, or shortness of breath.  Denies any active abdominal pain and otherwise is feeling back to normal now.  On exam, lungs clear and chest nontender.  No murmur.  Abdomen nontender.  Specifically no tenderness in his left upper quadrant.  No flank or back tenderness.  Mucous membranes are moist.  He otherwise is well-appearing.  Patient had workup in triage that was overall reassuring.  His urinalysis showed glucose but otherwise did not show evidence of UTI and there is no hemoglobin that would suggest kidney stone causing the abdominal pain.  His CBC met reassuring and his metabolic panel did not show hyperglycemia, glucose is actually in the 200s and is now in the 170s on recheck this morning.  Normal kidney function and liver function is reassuring.  Lipase not elevated.  Patient reports she is primarily her to make sure his "pancreas did not explode" with pancreatitis.  Do not see that at this time.  Due to the patient's fatigue and GI symptoms we did offer viral testing which she excepted.  He will follow-up on this result with his PCP and with MyChart.  As his glucose has remained more stable and he is well-appearing and has proven some stability he would like to go home.  We agreed that we have low suspicion for internal hernia with his previous abdominal surgery and with his lack of acute tenderness or pain we will hold on CT at this time.  Patient understands return precautions of any was to worsen he would likely to come back to consider CT imaging his abdomen pelvis.  We also do not think he needs any other chest x-ray or thoracic workup given lack of any chest pain, shortness breath, palpitations or upper torso discomfort.  Patient agrees.  Will get the  COVID/RSV/flu swab and if patient passes p.o. challenge will discharge home.  Patient agrees with this plan.  Glucose is in the 170s and is similar to his monitor.  Patient feels comfortable with discharge home.  Will get the viral swabs and he will be discharged for outpatient follow-up.         Final Clinical Impression(s) / ED Diagnoses Final diagnoses:  Hypoglycemia  Fatigue, unspecified type  Diarrhea, unspecified type    Clinical Impression: 1. Hypoglycemia   2. Fatigue, unspecified type   3. Diarrhea, unspecified type     Disposition: Discharge  Condition: Good  I have discussed the results, Dx and Tx plan with the pt(& family if present). He/she/they expressed understanding and agree(s) with the plan. Discharge instructions discussed at great length. Strict return precautions discussed and pt &/or family have verbalized understanding of the instructions. No further questions at time of discharge.    New Prescriptions   No medications on file    Follow Up: Margarito Courser, MD [place] [id number] [place] [address] [id number] mc [place] [phone]       Geddy Boydstun, Gwenyth Allegra, MD 11/11/22 [phone]

## 2022-11-11 NOTE — Discharge Instructions (Signed)
Your history, exam, evaluation are consistent with a likely viral diarrheal illness leading to your labile glucose readings.  Please keep a close eye on your glucose as you have been and please make sure you are resting and staying hydrated.  We collected the viral swabs so please follow-up on these results.  I suspect you have something viral, please rest for the next few days and follow-up with your primary doctor.  Please closely monitor your glucoses.  If any symptoms change return to the nearest emergency department.

## 2022-11-11 NOTE — ED Provider Triage Note (Signed)
Emergency Medicine Provider Triage Evaluation Note  Angel Pope , a 47 y.o. male  was evaluated in triage.  Pt complains of BS of 67 and felt LH and fatigued and drank soda and had oscillating BS measurements after. DM2 uses insulin none today.   Abd pain began around 6pm. No fever, NV or chest pain.   +diarrhea, no blood in stool  Review of Systems  Positive: Abd pain, diarrhea Negative: Fever   Physical Exam  BP (!) 171/106 (BP Location: Right Wrist)   Pulse (!) 108   Temp 98.6 F (37 C) (Oral)   Resp 18   SpO2 99%  Gen:   Awake, no distress   Resp:  Normal effort  MSK:   Moves extremities without difficulty  Other:  Abd soft nttp  Medical Decision Making  Medically screening exam initiated at 1:20 AM.  Appropriate orders placed.  Angel Pope was informed that the remainder of the evaluation will be completed by another provider, this initial triage assessment does not replace that evaluation, and the importance of remaining in the ED until their evaluation is complete.  Labs, CBG, ekg   Angel Pope, [place] 11/11/22 [phone]

## 2022-12-04 ENCOUNTER — Ambulatory Visit
Admission: RE | Admit: 2022-12-04 | Discharge: 2022-12-04 | Disposition: A | Discharge status: Home / Self Care | Payer: BC Managed Care – PPO | Source: Ambulatory Visit | Attending: Rehabilitation | Admitting: Rehabilitation

## 2022-12-04 DIAGNOSIS — M5412 Radiculopathy, cervical region: Secondary | ICD-10-CM

## 2023-02-11 NOTE — Progress Notes (Signed)
 Primary care provider: Camellia Most, MD Referring provider:      Camellia Most, MD  Assessment     1. Daytime hypersomnia   2. Snoring    47 year old man with morbid obesity presents to get set up with new equipment for sleep apnea. No prior sleep study on file.   Plan   Sleep study and cpap titration if needed (split if meets AASM criteria)  Annual influenza vaccination if no contraindications and pneumonia vaccination per current guidelines.   We discussed the diagnosis and treatment plan in detail and the patient expressed understanding.    Thank you for allowing us  to participate in the care of this patient.  Total time spent in this encounter on date of service was 45 minutes and does not include additional procedure time.  Including but not limited to activities such as reviewing patient records, obtaining or reviewing subjective medical history, obtaining or performing a history and physical examination, counseling and/or educating patient/family/caregiver, ordering prescription medication, tests, procedures, imaging, labs, referring to and communicating with other health care providers, documenting appropriate clinical information in the medical record electronic or other, interpreting results of prescribed tests/procedures, communicating those results to the patient/family/representative and coordinating patient care.    Orders Placed This Encounter  Procedures  . POLYSOMNOGRAM ONLY       Subjective   Chief Complaint  Patient presents with  . New Patient    Referred by Camellia Most, MD for sleep apnea     Patient ID:  Angel Pope is a 47 y.o. male, nonsmoker with history of sleep apnea presents to get new cpap . He uses cpap . No smart card. He's used it for 10 years. He is doing well overall and is waking up refreshed.   PULMONARY The patient is not on home oxygen therapy. He denies a history of blood clots. He denies exposure to asbestos. He denies  exposure to tuberculosis.  He denies history of a positive TB skin test.  He denies a history of COPD. He denies a history of asthma. He denies a recent hospitalization. He denies a prior history of lung cancer.  There is no a prior history of lung surgery.  He denies a prior history pneumonia.  Occupational history: Teacher    SLEEP He has a prior history of sleep apnea.He is using cpap or bipap.The patient denies excessive daytime sleepiness. He does not snore when using cpap. The patient denies disturbed sleep.  He denies morning headaches.   He has a history of restless leg syndrome.  The patient has leg movements in his sleep.  He does not act out dreams. He does not grind his teeth.   He denies sleep walking.  He does have nightmares.    He has sleep paralysis.  He denies hallucinations.  The patient denies cataplexy.   He does not work the second or third shift.   He goes to sleep at 10 PM and wakes up at 8 AM.  He sleeps for a total of about  10 hours. It take him less than 30 minutes to fall asleep. He wakes up 2 time(s) during the night. He wakes up due to the following: bathroom The patient denies nocturia.  The patient denies unexplained arousals from sleep.  He drinks 0 caffeinated beverages a day.    [facility]  The patient denies a family member with sleep apnea.   He has not gained weight  recently. The patient's neck size is 18 inches.  Epworth Sleep Questionnaire  How likely are you to doze off or fall asleep in the following situations?  Sitting and reading: 2 = moderate chance of dozing Watching TV: 2 = moderate chance of dozing Sitting inactive in a public place (such as a theater or a meeting):  1 = slight chance of dozing As a passenger in a car for an hour without a break: 2 = moderate chance of dozing Lying down to rest in the afternoon when circumstances permit: 2 = moderate chance of dozing Sitting and talking to  someone: 0 = no chance of dozing Sitting quiety after a lunch without alcohol: 1 = slight chance of dozing In a car, while stopped for a few minutes in traffic: 0 = no chance of dozing  Total Epworth: 10  Review of Systems  Constitutional: Negative.   HENT: Negative.    Eyes: Negative.   Respiratory: Negative.    Cardiovascular: Negative.   Gastrointestinal: Negative.   Genitourinary: Negative.   Musculoskeletal: Negative.   Skin: Negative.   Neurological: Negative.   Endo/Heme/Allergies: Negative.   Psychiatric/Behavioral:  Positive for depression.   All other systems reviewed and are negative.   History   Past Medical History:  Diagnosis Date  . Anxiety   . Diabetes mellitus (*)    TYPE 2  . Headache 02/18/2014  . Hypertension   . Neuropathy   . Pseudotumor cerebri   . Right shoulder pain 09/25/2015  . Sleep apnea    on cpap machine, settings on 12  . Urinary anastomotic stricture    Past Surgical History:  Procedure Laterality Date  . Anal sphincterotomy    . Appendectomy    . Carpal tunnel release Right    WRIST  . Cervical discectomy N/A    c5, c6 plate inside  . Colonoscopy  2012  . Cystoscopy w/ internal urethrotomy  12/11/2022   OPTILUME BALLOON DILATION URETHRA  . Elbow surgery Bilateral   . Laparoscopic gastric banding    . Shoulder surgery Right 05/05/2019  . Spine surgery  2016   Spinal fusion at  C5-C6  . Upper gastrointestinal endoscopy  2012   Social History   Socioeconomic History  . Marital status: Divorced  Occupational History    Employer: [employer]  Tobacco Use  . Smoking status: Never    Passive exposure: Never  . Smokeless tobacco: Never  Vaping Use  . Vaping Use: Never used  Substance and Sexual Activity  . Alcohol use: Never  . Drug use: Never  . Sexual activity: Not Currently    Partners: Female    Birth control/protection: Abstinence   Family History  Problem Relation Age of Onset  . Nephrolithiasis  Father   . Heart disease Father   . Arthritis Father   . Cancer Father        Colon cancer  . Kidney disease Father     Medication History      Medication Sig Dispense Refill  . atorvastatin  (LIPITOR) 20 mg tablet TAKE 1 TABLET BY MOUTH EVERY DAY (Patient taking differently: Take one tablet (20 mg dose) by mouth every evening.) 30 tablet 0  . BD PEN NEEDLE MINI U/F 31G X 5 MM MISC For Tresiba  FlexTouch and NovoLog  FlexPen pen injections.  Four injections daily. 400 each PRN  . betamethasone dipropionate (DIPROLENE) 0.05% cream     . clonazePAM (KLONOPIN) 1 mg tablet SMARTSIG:0.5-1 Tablet(s) By Mouth Daily PRN    .  clopidogrel  bisulfate (PLAVIX ) 75 mg tablet Take one tablet (75 mg dose) by mouth daily. 30 tablet 5  . clotrimazole-betamethasone (LOTRISONE) cream Apply to affected area 2 times daily 45 g 1  . Continuous Blood Gluc Sensor (DEXCOM G7 SENSOR) MISC 1 each by Does not apply route every 10 days. 3 each PRN  . Continuous Blood Gluc Transmit (DEXCOM G6 TRANSMITTER) MISC CHANGE TRANSMITTER EVERY 90 DAYS    . FARXIGA 10 MG tablet TAKE 1 TABLET BY MOUTH EVERY DAY (Patient taking differently: Take one tablet (10 mg dose) by mouth every evening.) 30 tablet 5  . fentaNYL  (DURAGESIC ) 50 mcg/hour one patch every 3 (three) days.    SABRA FREESTYLE INSULINX TEST test strip     . insulin  aspart (NOVOLOG  FLEXPEN) 100 unit/mL injection Inject thirty Units into the skin 15 (fifteen) minutes before meals. 90 mL 1  . Insulin  Degludec (TRESIBA  FLEXTOUCH) 200 UNIT/ML injection INJECT 100 UNITS INTO THE SKIN DAILY 18 mL 1  . lamotrigine  (LAMICTAL ) 200 MG tablet Take one tablet (200 mg dose) by mouth daily.    SABRA lisinopril (PRINIVIL,ZESTRIL) 10 mg tablet Take two tablets (20 mg dose) by mouth daily. Please hold lisinopril if systolic blood pressure less than 110. 90 tablet 1  . lithium  carbonate (LITHOBID) 300 mg ER tablet Take one tablet (300 mg dose) by mouth 2 (two) times daily.    . LUNESTA 3 MG tablet  SMARTSIG:0.5-1 Tablet(s) By Mouth Every Night PRN    . metFORMIN ER (GLUCOPHAGE-XR) 500 mg 24 hr tablet TAKE 2 TABLETS BY MOUTH EVERY DAY WITH BREAKFAST 180 tablet 1  . Naloxone HCl 4 MG/0.1ML LIQD nasal spray one spray as needed.    . nystatin (MYCOSTATIN) powder Apply to affected area 3 times daily 60 g 5  . OLANZapine  (ZYPREXA ) 10 mg tablet Take one tablet (10 mg dose) by mouth at bedtime.    . ondansetron  (ZOFRAN -ODT) 4 mg disintegrating tablet Take one tablet (4 mg dose) by mouth daily as needed.    . oxyCODONE  HCl (ROXICODONE ) 15 mg immediate release tablet Take one tablet (15 mg dose) by mouth every 6 (six) hours as needed for Pain.    SABRA testosterone cypionate (DEPOTESTOTERONE CYPIONATE) 200 MG/ML injection INJECT 1 ML INTRAMUSCULARLY EVERY 14 DAYS 2 mL 5  . tiZANidine  (ZANAFLEX ) 4 mg tablet Take by mouth.    . traZODone (DESYREL) 50 MG tablet SMARTSIG:0.5-1 Tablet(s) By Mouth Twice Daily PRN    . UNABLE TO FIND CPAP machine     No current facility-administered medications for this visit.   Allergies  Allergen Reactions  . Ciprofloxacin Unknown and Hives  . Metronidazole Nausea And Vomiting  . Sulfa Antibiotics Hives  . Tylenol  [Acetaminophen ] Hives and Nausea Only  . Aspartame Nausea Only       I reviewed the patient's medical,surgical,social and family history. The medications and allergies have been reviewed and updated.   Pulmonary Function Testing/Spirometry  No results found. Radiology   CXR:  Results for orders placed during the hospital encounter of 08/21/15  XR Chest Pa And Lateral  Narrative FRONTAL AND LATERAL RADIOGRAPHS OF THE CHEST:  ADDITIONAL CLINICAL INFORMATION: None available  COMPARISON: 09/06/2014  FINDINGS:  Impression The cardiac silhouette is unremarkable. Pulmonary vasculature is normal. There is no evidence of pneumothorax or pleural effusion. The lungs are clear without evidence of focal consolidation. Cervical spinal fusion hardware  noted.   IMPRESSION:  No evidence of active disease.    CT Scan: No results found  for this or any previous visit.   No results found for this or any previous visit.   No results found for this or any previous visit.   No results found for this or any previous visit.   No results found for this or any previous visit.   No results found for this or any previous visit.   Results for orders placed during the hospital encounter of 08/21/15  CT Angio Pulmonary  Narrative PULMONARY CT ANGIOGRAM WITH INTRAVENOUS CONTRAST:  TECHNIQUE: Axially acquired CT images of the chest after injection of 75 mL of IOPAMIDOL 76 % IV SOLN contrast in the arterial phase. Oblique coronal maximum intensity projection (MIP) 3D images were obtained interpreted and archived. CT dose reduction techniques utilized.  ADDITIONAL CLINICAL INFORMATION: Hemoptysis and short of breath.  COMPARISON: None available  CT ANGIO PULMONARY 08/21/2015 12:02 PM  HISTORY:  Impression hemoptysis, surgery, ? PE  COMPARISON:  None.  FINDINGS:  Coronal and both oblique reconstructions were also performed.  3-D reconstructions were obtained and stored.  Study limited due to body habitus.  I see no filling defects in the central pulmonary arteries to suggest emboli. No saddle embolus is seen. Evaluation of the segmental and subsegmental pulmonary artery branches is suboptimal. I see no aortic aneurysm or dissection.  There is right perihilar infiltrate. There is mild groundglass opacity and dependent atelectasis in both bases. No pneumothorax seen. No central airway abnormality.  Gastric band is in place.  I see no acute bony abnormalities.   IMPRESSION: 1. Study not adequate for evaluation of the segmental or subsegmental pulmonary artery branches. No central or saddle embolus seen. Ventilation/perfusion scan may be necessary if there is continued clinical suspicion for emboli. 2. Right perihilar  infiltrate suggests possibility of aspiration. 3. Bibasilar groundglass atelectatic changes.   No results found for this or any previous visit.   VQ Scan: No results found for this or any previous visit.   TTE:   No results found. However, due to the size of the patient record, not all encounters were searched. Please check Results Review for a complete set of results.  Objective  BP 122/80   Pulse 86   Ht 6' 4 (1.93 m)   Wt (!) 485 lb (220 kg)   SpO2 98%   BMI 59.04 kg/m   General appearance:  alert, appears stated age, and cooperative Head:    normocephalic Eyes:    pupils are equal, round and reactive Nose:     normal Mouth:    moist, no thrush, mallampati 4 Neck:    supple, no significant adenopathy, no thyromegaly, no JVD Chest:    clear to auscultation, no wheezes, rales or rhonchi, symmetric air entry Heart:    normal rate, regular rhythm, normal S1, S2, no murmurs, rubs, clicks or gallops Abdomen:   soft, nontender, nondistended Neurological:   alert, oriented Extremities:   peripheral pulses normal,no  pitting edema , no clubbing or cyanosis  The patient was given a copy of their after visit summary.  Voice-recognition software was used in [employer] of this documentation. Unintended transcription errors may have escaped editorial review.    Immunizations     Name Date Dose VIS Date Route   (FlucelvaxPF) Influenza, injectable, MDCK, preservative free, quadrivalent 08/01/2022 0.5 mL 06/09/2020 Intramuscular   Site: Right Deltoid   Given By: Chelsea Kaitlin Moore, CMA   Manufacturer: [employer]   Lot: [id number]   (FlucelvaxPF) Influenza, injectable, MDCK, preservative free, quadrivalent 08/16/2015  6:10  AM 0.5 mL 06/10/2014 Intramuscular   Site: Left Deltoid   Given By: Mariel LITTIE Holts, RN   Manufacturer: [employer]   Lot: [id number]   HepB-CpG(Heplisav-B) 02/14/2021 0.5 mL 08/15/2017 Intramuscular   Site: Left Deltoid   Given By: Hipolito Halt, CMA    Manufacturer: [employer]   Lot: [id number]   Hepatitis A Adult 02/14/2021 1 mL 05/24/2015 Intramuscular   Site: Left Deltoid   Given By: Hipolito Halt, CMA   Manufacturer: [employer]   Lot: [id number]   Influenza Afluria Quad 0.73ml 07/03/2019 0.5 mL -- --   Influenza Flulaval Quad 0.40ml 09/06/2016  3:28 PM 0.5 mL 06/10/2014 Intramuscular   Site: Left Deltoid   Given By: Annabella Kyle, CMA   Manufacturer: [employer]   Lot: [id number]   Influenza Quad 0.5ml single-dose vial(Fluzone) 07/03/2019 0.5 Syringe -- --   Influenza Quad Pfree 08/15/2018 0.5 mL -- --   Lot: [id number]   Influenza virus vaccine, unspecified formulation 09/06/2016 0.5 mL -- Intramuscular   Lot: [id number]   Influenza virus vaccine, unspecified formulation 07/15/2013 -- -- Intramuscular   Lot: [id number]   Influenza, Injectable Quad (IIV4)(Afluria,Flulaval,Fluzone) 08/16/2015 0.5 mL -- Intramuscular   Lot: [id number]   Influenza, injectable, quadrivalent, preservative free (IIV4)(AfluriaPF,FluarixPF,FluzonePf,FlulavalPF) 09/06/2016 0.5 mL -- --   Site: Left Deltoid   Manufacturer: [employer]   Lot: [id number]   Influenza, seasonal, injectable, preservative free (IIV3)(AfluriaPF,FluvarinPF,FlulavalPF,FluzonePF) 07/15/2013 0.5 mL -- Intramuscular   Site: Left Deltoid   Manufacturer: [employer]   Lot: [id number]   Janssen Covid Vaccine 03/03/2020 -- -- --   Manufacturer: [employer]   Lot: [id number]   MMR 08/09/2010 0.5 mL -- Subcutaneous   Site: Left Arm   Manufacturer: [employer]   Lot: [id number]   MMR 06/14/2010 0.5 mL -- Subcutaneous   Site: Left Arm   Manufacturer: [employer]   Lot: [id number]   Pneumococcal Polysaccharide (Pneumovax) 02/14/2021 0.5 mL 09/02/2018 Intramuscular   Site: Right Deltoid   Given By: Hipolito Halt, CMA   Manufacturer: [employer]   Lot: [id number]   Seasonal trivalent influenza vaccine, adjuvanted, preservative free 07/15/2013  5:32 PM 0.4ml 05/29/2012 Intramuscular   Site: Left Deltoid   Given  By: Annabella Kyle, CMA   Manufacturer: [employer]   Lot: [id number]   Td (adult), 2 Lf tetanus toxoid, preservative free, adsorbed (TDVAX) 03/13/2011 0.5 mL -- Intramuscular   Site: Left Deltoid   Manufacturer: [employer]   Lot: [id number]   Td (adult), 2 Lf tetanus toxoid, preservative free, adsorbed (TDVAX) 08/09/2010 0.5 mL -- Intramuscular   Site: Right Deltoid   Manufacturer: [employer]   Lot: [id number]   Tdap 08/14/2018 0.5 mL -- --   Lot: [id number]   Tdap 06/14/2010 0.5 mL -- Intramuscular   Site: Right Deltoid   Manufacturer: [employer]   Lot: [id number]   Tdap 11/04/2008 -- -- --         Patient's Medications       * Accurate as of February 11, 2023 12:53 PM. Reflects encounter med changes as of last refresh          Continued Medications      Instructions  BD PEN NEEDLE MINI U/F 31G X 5 MM Misc Generic drug: Insulin  Pen Needle  For Tresiba  FlexTouch and NovoLog  FlexPen pen injections.  Four injections daily.   betamethasone dipropionate 0.05% cream Commonly known as: DEL-BETA  No dose, route, or frequency recorded.   clonazePAM  1 mg tablet Commonly known as: KLONOPIN  SMARTSIG:0.5-1 Tablet(s) By Mouth Daily PRN   clopidogrel  bisulfate 75 mg tablet Commonly known as: PLAVIX   75 mg, Oral, Daily   clotrimazole-betamethasone cream Commonly known as: LOTRISONE  Apply to affected area 2 times daily   DEXCOM G6 TRANSMITTER Misc  CHANGE TRANSMITTER EVERY 90 DAYS   DEXCOM G7 SENSOR Misc  1 each, Does not apply, Every 10 days   fentaNYL  50 mcg/hour Commonly known as: DURAGESIC   1 patch, Every 3 days   FREESTYLE INSULINX TEST test strip Generic drug: glucose blood  No dose, route, or frequency recorded.   lamoTRIgine  200 MG tablet Commonly known as: LAMICTAL   200 mg, Oral, Daily   lisinopril 10 mg tablet Commonly known as: PRINIVIL,ZESTRIL  20 mg, Oral, Daily, Please hold lisinopril if systolic blood pressure less  than 110.   lithium  carbonate 300 mg ER tablet Commonly known as: LITHOBID  300 mg, Oral, 2 times a day   LUNESTA 3 mg tablet Generic drug: eszopiclone  SMARTSIG:0.5-1 Tablet(s) By Mouth Every Night PRN   metFORMIN ER 500 mg 24 hr tablet Commonly known as: GLUCOPHAGE-XR  TAKE 2 TABLETS BY MOUTH EVERY DAY WITH BREAKFAST   Naloxone HCl 4 MG/0.1ML Liqd nasal spray  4 mg, As needed   NOVOLOG  FLEXPEN 100 unit/mL injection Generic drug: insulin  aspart  30 Units, Subcutaneous, 15 minutes before meals   nystatin powder Commonly known as: MYCOSTATIN  Apply to affected area 3 times daily   OLANZapine  10 mg tablet Commonly known as: ZYPREXA   10 mg, Oral, At bedtime   ondansetron  4 mg disintegrating tablet Commonly known as: ZOFRAN -ODT  4 mg, Oral, Daily as needed   oxyCODONE  HCl 15 mg immediate release tablet Commonly known as: ROXICODONE   15 mg, Oral, Every 6 hours as needed   testosterone cypionate 200 MG/ML injection Commonly known as: DEPOTESTOTERONE CYPIONATE  INJECT 1 ML INTRAMUSCULARLY EVERY 14 DAYS   tiZANidine  4 mg tablet Commonly known as: ZANAFLEX   Oral   traZODone 50 MG tablet Commonly known as: DESYREL  SMARTSIG:0.5-1 Tablet(s) By Mouth Twice Daily PRN   TRESIBA  FLEXTOUCH 200 UNIT/ML injection Generic drug: Insulin  Degludec  INJECT 100 UNITS INTO THE SKIN DAILY   UNABLE TO FIND  CPAP machine        Modified Medications      Instructions  atorvastatin  20 mg tablet Commonly known as: LIPITOR What changed: when to take this  TAKE 1 TABLET BY MOUTH EVERY DAY   FARXIGA 10 mg tablet Generic drug: dapagliflozin What changed: when to take this  10 mg, Oral, Daily       Discontinued Medications    hydrOXYzine  HCl 50 mg tablet Commonly known as: ATARAX  Stopped by: Adnan Javaid, MD FCCP   ibuprofen  200 mg tablet Commonly known as: ADVIL ,MOTRIN  Stopped by: Adnan Javaid, MD FCCP   loperamide 2 MG tablet Commonly known as:  ANTI-DIARRHEAL,IMODIUM A-D Stopped by: Adnan Javaid, MD FCCP   nortriptyline  HCl 25 mg capsule Commonly known as: PAMELOR  Stopped by: Adnan Javaid, MD FCCP   OZEMPIC (2 MG/DOSE) 8 MG/3ML Sopn pen Generic drug: Semaglutide (2 MG/DOSE) Stopped by: Adnan Javaid, MD FCCP   tadalafil 5 MG tablet Commonly known as: CIALIS Stopped by: Adnan Javaid, MD FCCP           *Some images could not be shown.

## 2023-06-29 ENCOUNTER — Encounter (HOSPITAL_COMMUNITY): Payer: Self-pay | Admitting: *Deleted

## 2023-06-29 ENCOUNTER — Ambulatory Visit (INDEPENDENT_AMBULATORY_CARE_PROVIDER_SITE_OTHER): Payer: BC Managed Care – PPO

## 2023-06-29 ENCOUNTER — Ambulatory Visit (HOSPITAL_COMMUNITY)
Admission: EM | Admit: 2023-06-29 | Discharge: 2023-06-29 | Disposition: A | Discharge status: Home / Self Care | Payer: BC Managed Care – PPO

## 2023-06-29 DIAGNOSIS — M25561 Pain in right knee: Secondary | ICD-10-CM

## 2023-06-29 DIAGNOSIS — W19XXXA Unspecified fall, initial encounter: Secondary | ICD-10-CM | POA: Diagnosis not present

## 2023-06-29 NOTE — ED Provider Notes (Signed)
[facility]    CSN: [id number] Arrival date & time: 06/29/23  1345      History   Chief Complaint Chief Complaint  Patient presents with   Fall   Leg Injury    HPI Angel Pope is a 47 y.o. male.   Patient presents with right knee and right lower leg pain after he fell down a set of stairs about 5 days ago.  He reports mechanical fall.  He does use a cane for stability.  He reports right knee and leg pain is worse with ambulation.  Reports mild right knee swelling.  Denies previous knee problems.  He is unsure if he landed on his knee.  He has been applying Bengay with minimal relief.  Patient is on chronic narcotics for other problems.    Past Medical History:  Diagnosis Date   Anal fissure    Anxiety    BPH (benign prostatic hyperplasia)    Depression    sees Dr. Archer Asa    Fever of unknown origin    seeing Dr. Enedina Finner    Morbid obesity [facility])    Nausea    chronic- Dr Leone Payor   Overactive bladder    Overactive bladder    Pain, low back    Plantar fasciitis    Sleep apnea    uses cpap   Ureterolithiasis     Patient Active Problem List   Diagnosis Date Noted   Type 2 diabetes mellitus with other specified complication (HCC) 08/16/2018   Low back pain 08/14/2018   Morbid obesity (HCC) 03/26/2012   CHRONIC PROSTATITIS 01/08/2011   FEVER UNSPECIFIED 01/08/2011   ABDOMINAL PAIN RIGHT UPPER QUADRANT 05/15/2010   DIARRHEA-PRESUMED INFECTIOUS 07/31/2009   ANAL FISSURE 07/31/2009   HEMATEMESIS 07/31/2009   NAUSEA AND VOMITING 07/31/2009   VOMITING 07/31/2009   DYSPHAGIA UNSPECIFIED 07/31/2009   DYSPHAGIA 07/31/2009   GERD 07/25/2009   HEADACHE 07/19/2009   HEMORRHOID, THROMBOSED 05/15/2009   OVERACTIVE BLADDER 12/15/2007   ANXIETY 08/03/2007   DEPRESSION 08/03/2007   LOW BACK PAIN 08/03/2007   SLEEP APNEA 08/03/2007    Past Surgical History:  Procedure Laterality Date   ANAL SPHINCTEROTOMY     dr cornett 06/04/09    APPENDECTOMY     CERVICAL FUSION  2017   ESOPHAGOGASTRODUODENOSCOPY     dr Leone Payor - normal   lab band  06/06/2008   dr hoxworth       Home Medications    Prior to Admission medications   Medication Sig Start Date End Date Taking? Authorizing Provider  atorvastatin (LIPITOR) 20 MG tablet Take 1 tablet by mouth daily. 06/14/19  Yes [provider]  betamethasone dipropionate 0.05 % cream Apply 1 application topically daily as needed (itching).  02/24/20  Yes [provider]  clopidogrel (PLAVIX) 75 MG tablet Take 1 tablet by mouth daily. 06/23/23  Yes [provider]  clotrimazole-betamethasone (LOTRISONE) cream Apply 1 application topically daily as needed (itching).  02/23/20  Yes [provider]  Continuous Glucose Sensor (DEXCOM G6 SENSOR) MISC DISPENSE AND USE AS DIRECTED 07/06/19  Yes [provider]  DULoxetine (CYMBALTA) 20 MG capsule Take by mouth. 06/26/23  Yes [provider]  ESZOPICLONE 3 MG tablet Take 3 mg by mouth at bedtime.  03/23/20  Yes [provider]  famotidine (PEPCID) 20 MG tablet Take 1 tablet (20 mg total) by mouth 2 (two) times daily. 04/13/20  Yes Renne Crigler, PA-C  FARXIGA 10 MG TABS tablet  Take 10 mg by mouth every evening.  08/09/18  Yes [provider]  gabapentin (NEURONTIN) 300 MG capsule Take 600 mg by mouth 3 (three) times daily. 10/06/17  Yes [provider]  hydrOXYzine (ATARAX/VISTARIL) 10 MG tablet Take 10 mg by mouth 3 (three) times daily. 10/01/17  Yes [provider]  hydrOXYzine (ATARAX/VISTARIL) 50 MG tablet Take 50 mg by mouth at bedtime as needed for anxiety.  03/23/20  Yes [provider]  insulin aspart protamine- aspart (NOVOLOG MIX 70/30) (70-30) 100 UNIT/ML injection Inject 16 Units into the skin in the morning, at noon, and at bedtime. Sliding Scale   Yes [provider]  LAMICTAL 200 MG tablet Take 200 mg by mouth at bedtime.  03/23/20  Yes  [provider]  lidocaine (XYLOCAINE) 5 % ointment Apply 1 application topically daily as needed for mild pain or moderate pain.  07/14/18  Yes [provider]  lisinopril (ZESTRIL) 10 MG tablet Take by mouth. 06/14/19  Yes [provider]  lithium carbonate (LITHOBID) 300 MG CR tablet Take 300 mg by mouth 2 (two) times daily. 03/20/20  Yes [provider]  metFORMIN (GLUCOPHAGE-XR) 500 MG 24 hr tablet Take 500 mg by mouth 2 (two) times daily. 03/27/20  Yes [provider]  nebivolol (BYSTOLIC) 2.5 MG tablet Take 2.5 mg by mouth daily.   Yes [provider]  nystatin (MYCOSTATIN/NYSTOP) powder Apply 1 application topically daily as needed (rash).  03/25/20  Yes [provider]  OLANZapine (ZYPREXA) 10 MG tablet Take 10 mg by mouth at bedtime.   Yes [provider]  omeprazole (PRILOSEC) 20 MG capsule Take 20 mg by mouth 2 (two) times daily as needed (indigestion).  03/26/20  Yes [provider]  Oxycodone HCl 10 MG TABS Take 10 mg by mouth 4 (four) times daily as needed (pain).  03/28/20  Yes [provider]  OZEMPIC, 1 MG/DOSE, 2 MG/1.5ML SOPN Inject 1 mL as directed once a week. Take on Tues 08/09/18  Yes [provider]  QUEtiapine (SEROQUEL) 400 MG tablet Take 600 mg by mouth at bedtime.  03/23/20  Yes [provider]  testosterone cypionate (DEPOTESTOSTERONE CYPIONATE) 200 MG/ML injection Inject 0.5 mLs into the muscle every 14 (fourteen) days. 04/27/18 06/29/23 Yes [provider]  tiZANidine (ZANAFLEX) 4 MG tablet Take 4-6 mg by mouth every 6 (six) hours as needed for muscle spasms.  03/25/20  Yes [provider]  traZODone (DESYREL) 50 MG tablet Take 0.5-1 tablets by mouth 2 (two) times daily as needed. 01/21/23  Yes [provider]  TRESIBA FLEXTOUCH 200 UNIT/ML SOPN Inject 100 Units into the skin every evening.  10/03/17  Yes [provider]  chlorhexidine  (PERIDEX) 0.12 % solution Use as directed 15 mLs in the mouth or throat daily.  03/28/20   [provider]  fentaNYL (DURAGESIC - DOSED MCG/HR) 25 MCG/HR patch Place 1 patch (25 mcg total) onto the skin every 3 (three) days. 08/16/18   Marlin Canary U, DO  MYRBETRIQ 25 MG TB24 tablet Take 25 mg by mouth every evening.  02/27/20   [provider]  nortriptyline (PAMELOR) 25 MG capsule Take 25 mg by mouth at bedtime. 08/12/18   [provider]  ondansetron (ZOFRAN) 4 MG tablet Take 4 mg by mouth every 8 (eight) hours as needed for nausea or vomiting.  03/23/20   [provider]  venlafaxine XR (EFFEXOR-XR) 75 MG 24 hr capsule Take 75 mg by  mouth at bedtime.  04/19/16   [provider]  XYOSTED 75 MG/0.5ML SOAJ Inject 75 mg into the skin once a week. Take on Tues 03/27/20   [provider]    Family History Family History  Problem Relation Age of Onset   Cancer Father        colon   Depression Other    Hypertension Other    Diabetes Other    COPD Other        adrenal tumors   Cystic fibrosis Other        paternall cousins   Inflammatory bowel disease Other        paternall aunt    Social History Social History   Tobacco Use   Smoking status: Never   Smokeless tobacco: Never  Vaping Use   Vaping status: Never Used  Substance Use Topics   Alcohol use: No   Drug use: No     Allergies   Ciprofloxacin, Metronidazole, Aspartame and phenylalanine, Etodolac, Acetaminophen, and Sulfamethoxazole-trimethoprim   Review of Systems Review of Systems  Constitutional:  Negative for chills and fever.  HENT:  Negative for ear pain and sore throat.   Eyes:  Negative for pain and visual disturbance.  Respiratory:  Negative for cough and shortness of breath.   Cardiovascular:  Negative for chest pain and palpitations.  Gastrointestinal:  Negative for abdominal pain and vomiting.  Genitourinary:  Negative for dysuria and hematuria.   Musculoskeletal:  Positive for arthralgias. Negative for back pain.  Skin:  Negative for color change and rash.  Neurological:  Negative for seizures and syncope.  All other systems reviewed and are negative.    Physical Exam Triage Vital Signs ED Triage Vitals  Encounter Vitals Group     BP 06/29/23 1522 118/85     Systolic BP Percentile --      Diastolic BP Percentile --      Pulse Rate 06/29/23 1522 77     Resp 06/29/23 1522 20     Temp 06/29/23 1522 98.2 F (36.8 C)     Temp Source 06/29/23 1522 Oral     SpO2 06/29/23 1522 97 %     Weight --      Height --      Head Circumference --      Peak Flow --      Pain Score 06/29/23 1516 5     Pain Loc --      Pain Education --      Exclude from Growth Chart --    No data found.  Updated Vital Signs BP 118/85 (BP Location: Right Arm)   Pulse 77   Temp 98.2 F (36.8 C) (Oral)   Resp 20   SpO2 97%   Visual Acuity Right Eye Distance:   Left Eye Distance:   Bilateral Distance:    Right Eye Near:   Left Eye Near:    Bilateral Near:     Physical Exam Vitals and nursing note reviewed.  Constitutional:      General: He is not in acute distress.    Appearance: He is well-developed.  HENT:     Head: Normocephalic and atraumatic.  Eyes:     Conjunctiva/sclera: Conjunctivae normal.  Cardiovascular:     Rate and Rhythm: Normal rate and regular rhythm.     Heart sounds: No murmur heard. Pulmonary:     Effort: Pulmonary effort is normal. No respiratory distress.     Breath sounds: Normal breath sounds.  Abdominal:     Palpations: Abdomen is soft.     Tenderness: There is no abdominal tenderness.  Musculoskeletal:        General: No swelling.     Cervical back: Neck supple.     Right knee: Bony tenderness present. Tenderness present over the medial joint line.  Skin:    General: Skin is warm and dry.     Capillary Refill: Capillary refill takes less than 2 seconds.  Neurological:     Mental Status: He is alert.   Psychiatric:        Mood and Affect: Mood normal.      UC Treatments / Results  Labs (all labs ordered are listed, but only abnormal results are displayed) Labs Reviewed - No data to display  EKG   Radiology DG Tibia/Fibula Right  Result Date: 06/29/2023 CLINICAL DATA:  Right knee and lower leg pain after a fall 5 days ago. EXAM: RIGHT TIBIA AND FIBULA - 2 VIEW COMPARISON:  None Available. FINDINGS: No fracture is identified. The knee and ankle are located. There is prominent patellofemoral marginal osteophytosis and joint space narrowing, and there is less pronounced marginal osteophytosis in the medial and lateral compartments with moderate medial joint space narrowing. Degenerative spurring is noted in the midfoot, and there are moderate-sized posterior and plantar calcaneal enthesophytes. IMPRESSION: 1. No acute osseous abnormality. 2. Tricompartmental osteoarthrosis of the knee, greatest in the patellofemoral compartment. Electronically Signed   By: Sebastian Ache M.D.   On: 06/29/2023 16:55    Procedures Procedures (including critical care time)  Medications Ordered in UC Medications - No data to display  Initial Impression / Assessment and Plan / UC Course  I have reviewed the triage vital signs and the nursing notes.  Pertinent labs & imaging results that were available during my care of the patient were reviewed by me and considered in my medical decision making (see chart for details).     No fractures on imaging.  Supportive care discussed.  Pt will follow up with ortho.  Final Clinical Impressions(s) / UC Diagnoses   Final diagnoses:  Acute pain of right knee  Fall, initial encounter     Discharge Instructions      Can follow up with orthopedics if no improvement Recommend ice, elevation Can apply compression sleeve     ED Prescriptions   None    PDMP not reviewed this encounter.   Ward, Tylene Fantasia, PA-C 06/29/23 [phone]

## 2023-06-29 NOTE — Discharge Instructions (Addendum)
Can follow up with orthopedics if no improvement Recommend ice, elevation Can apply compression sleeve

## 2023-06-29 NOTE — ED Triage Notes (Signed)
Pt states he fell down 5 days ago and he is having right knee and lower leg pain, he does have pain meds at home he has been taking. He did put Bengay on his leg.

## 2024-02-15 NOTE — Progress Notes (Signed)
 [facility] Follow-up Note  Antibiotics: Miconazole oint / Fluconazole 02/16/2024-pres   Subjective  Patient ID:  Angel Pope is a 48 y.o. (07-Feb-1976) male with a history of severe obesity BMI 59.3, that has previously experienced episodes of panniculitis of the abdomen, most recently mid December 2021, for which she was treated with 10-day course of Keflex, and experience with improvement.  Near the end of January, the patient had apparently experience recurrent symptoms, had contacted his PCP regarding the lump and induration over the region abdomen, and has been somewhat tender as well.  He denied having any systemic symptoms including fevers or chills, and he was referred to infectious disease.  Previous work-up at that time demonstrated a normal CRP of 10, metabolic panel notable for creatinine of 0.96, with a BUN of 16, otherwise normal LFTs, electrolytes, while he is CBC demonstrated WBC of 9.3, Hgb 15.8, PLT 259.  Patient last A1c 12/15 was 8.7%, and was sent for CT of the abdomen and pelvis with contrast which demonstrated no acute intra-abdominal process, nodular contour of the liver, suggestive of cirrhosis, without any focal liver lesions, but demonstrating soft tissue inflammatory change and edema throughout the pannus.  Patient notes having intermittent episodes over the past 6 weeks or so of skin breakdown, with erythema and inflammation.  This seems to currently be resolved however, he has no real open areas, erythematous lesions, and notes that the undersurface of his pannus is not tender or having any drainage, blistering formation etc.  He has few lumps, primarily over the right hemiabdomen, and in particular the right lower quadrant where there is a ecchymotic appearance to the skin and is described as being mildly tender to palpation.  In the right upper quadrant seems to be just minimally tender if at all.   He reports use of skin care regimen including betamethasone  cream, clotrimazole cream, cleansing his skin and undersurface of his pannus at least twice a day, and understands the importance of keeping this dry.  He is already lost apparently 120 pounds, and that is encouraging and would continue to support his weight loss.   Chief Complaint  Patient presents with  . Ringworm  . Panniculitis    Subjective: Angel Pope is seen today for evaluation of fungal infection at the request of his urologist Dr. Tanda.  Patient has been followed closely by urology secondary to urethral stricture, and recently had undergone dilation of said stricture, as well as assessment of urinary tract.  Patient did noted that he was having some ringworm changes over his genital region around the suprapubic region and the penile shaft itself.  He denied having any drainage, occasionally has some irritation and pain through the urethra usually more so around when he has strictures recurring.  He has not had any fevers or chills or any additional systemic symptoms.  Denies any bowel habit change, and has been seen by his PCP office where he was given prescriptions for fluconazole as well as miconazole ointment.  He has been using this, notes fairly good improvement.  He has not had any significant issues with new rashes, and worse previously we had evaluated him for cellulitis of his pannus, he reports that has been quiescent since our last encounter in 2022.  Review of Systems  Constitutional:  Negative for chills and fever.  HENT:  Negative for congestion.   Respiratory:  Negative for cough and shortness of breath.   Cardiovascular:  Negative for leg swelling.  Gastrointestinal:  Negative for abdominal pain and nausea.  Genitourinary:  Positive for dysuria and penile pain. Negative for difficulty urinating, frequency, hematuria, penile swelling and scrotal swelling.  Musculoskeletal:  Negative for arthralgias.  Skin:  Negative for color change and rash.   Allergic/Immunologic: Negative for immunocompromised state.  Neurological:  Negative for weakness.  Hematological:  Does not bruise/bleed easily.  Psychiatric/Behavioral:  Negative for sleep disturbance.   All other systems reviewed and are negative.   Objective  Temp 97.3 F (36.3 C) (Skin)   Ht 6' 4 (1.93 m)   Wt (!) 493 lb (223.6 kg)   BMI 60.01 kg/m   Physical Exam Vitals and nursing note reviewed.  Constitutional:      General: He is not in acute distress.    Appearance: Normal appearance. He is not ill-appearing.  HENT:     Head: Normocephalic and atraumatic.  Eyes:     General: No scleral icterus.    Conjunctiva/sclera: Conjunctivae normal.  Cardiovascular:     Rate and Rhythm: Normal rate and regular rhythm.     Pulses: Normal pulses.     Heart sounds: Normal heart sounds. No murmur heard.    No friction rub. No gallop.  Pulmonary:     Effort: Pulmonary effort is normal.     Breath sounds: Normal breath sounds.  Abdominal:     General: Bowel sounds are normal.     Palpations: Abdomen is soft.     Tenderness: There is no abdominal tenderness. There is no guarding.  Genitourinary:    Penis: Circumcised. No erythema, tenderness or discharge.   Neurological:     Mental Status: He is alert.  Psychiatric:        Behavior: Behavior normal.      Data   Lab Results  Component Value Date   WBC 8.6 07/21/2023   Hemoglobin 15.9 07/21/2023   Hematocrit 48.5 07/21/2023   MCV 84 07/21/2023   Platelet Count 239 07/21/2023   Lab Results  Component Value Date   Sodium 137 06/26/2023   Potassium 4.5 06/26/2023   Chloride 100 06/26/2023   CO2 24 06/26/2023   BUN 19 06/26/2023   Creatinine 0.83 06/26/2023   eGFR 110 06/26/2023   Glucose 126 (H) 06/26/2023   CALCIUM  9.5 06/26/2023   Alkaline Phosphatase 73 06/26/2023   Total Bilirubin 0.3 06/26/2023   Total Protein 6.8 06/26/2023   Albumin, Serum 3.9 (L) 06/26/2023   AST 16 06/26/2023   ALT (SGPT) 13  06/26/2023     Lab Results  Component Value Date   Hemoglobin A1c 7.3 (A) 05/29/2023   Impression   1. Dermatophytoses   2. Postinfective bulbous urethral stricture, not elsewhere classified, male   3. Hyper-IgE syndrome (*)   4. Severe obesity (BMI >= 40) (*)   5. Type 2 diabetes mellitus with other specified complication, with long-term current use of insulin  (*)   6. Hypertension associated with diabetes (*)   7. NAFLD (nonalcoholic fatty liver disease)    Discussion and Plan:   Angel Pope was seen today for ringworm and panniculitis. Diagnoses and all orders for this visit: 1. Dermatophytoses (Primary) -     Terbinafine HCl; Apply to affected area 2 times daily for 4-weeks -     Fluconazole; Take four tablets (800 mg dose) by mouth daily for 1 day, THEN two tablets (400 mg dose) daily for 6 days. -     CBC And Differential -     Sedimentation Rate, Automated -  C-Reactive Protein -     Comprehensive Metabolic Panel -     Protime-INR 2. 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 Severe obesity (BMI >= 40) (*) 5. Type 2 diabetes mellitus with other specified complication, with long-term current use of insulin  (*) 6. Hypertension associated with diabetes (*) 7. NAFLD (nonalcoholic fatty liver disease)   I think patient is doing well, clinically there is not really any residual H[place]a infection evidence, but counseling that well has improved with previous treatment, recommended completing and continuing topical antifungals for 4 weeks to decrease the risk of recurrence, and I think would also be worthwhile to take fluconazole to help eradicate further given his predisposition with severe obesity, and involved, as well as his underlying diabetes.  Prescription for fluconazole sent, he has terbinafine on hand and obtain this over-the-counter as well.  Would like to see what his  laboratory studies show as far as renal and liver function, particular given his underlying liver disease, and suggestion of cirrhosis previously.  Also obtain follow-up pending problems given his reported GI syndrome which could predispose to cutaneous infections as well.  If identified and confirmed think evaluation by [facility] immunology would be needed.  His diabetes overall seems to be under better control, last A1c less than 7%, and blood pressure today appears to be controlled, continue his current antihypertensive regimen, as well as concurrent medical therapy for his lipids.  Can continue to encourage weight loss with dietary modifications as well as increased aerobic exercise to extent possible, will continue follow-up with his PCP as scheduled.  Patient again as needed with any further issues.    Michael G Arapian, MD  Time taken to review patient medications. Education given for patient understanding of medicine name, the whys of use, dosage, potential side effects, drug interactions, and the consequences of not using.  Opportunity given for questions and answers.  Patient expressed understanding.  No barriers to adherence.   I have reviewed the information contained in this note and personally verified its accuracy.  I obtained the history of present illness and personally performed the physical exam.  Follow up if symptoms worsen or fail to improve.  Orders Placed This Encounter  Procedures  . CBC And Differential  . Sedimentation Rate, Automated  . C-Reactive Protein  . Comprehensive Metabolic Panel  . Protime-INR  . Immunoglobulins A/E/G/M, Serum      Patient's Medications       * Accurate as of February 16, 2024 11:17 AM. Reflects encounter med changes as of last refresh          New Prescriptions      Instructions  fluconazole 200 mg tablet Commonly known as: DIFLUCAN Start taking on: February 16, 2024 Started by: Ozell Sat  Take four tablets (800 mg  dose) by mouth daily for 1 day, THEN two tablets (400 mg dose) daily for 6 days.   terbinafine 1% cream Commonly known as: LAMISIL,SM ATHLETES FOOT Started by: Michael Arapian  Apply to affected area 2 times daily for 4-weeks       Continued Medications      Instructions  BD PEN NEEDLE MINI U/F 31G X 5 MM Misc Generic drug: Insulin  Pen Needle  For Tresiba  FlexTouch and NovoLog  FlexPen pen injections.  Four injections daily.   betamethasone dipropionate 0.05% cream Commonly known as: DEL-BETA  No dose, route, or frequency recorded.   clonazePAM 1 mg  tablet Commonly known as: KLONOPIN  Take 0.5-1 tablets by mouth daily as needed for Anxiety (PRN ANXIETY).   clopidogrel  bisulfate 75 mg tablet Commonly known as: PLAVIX   75 mg, Oral, Daily   DEXCOM G7 SENSOR Misc  USE AS DIRECTED EVERY 10 DAYS   famotidine  20 mg tablet Commonly known as: PEPCID   20 mg, At bedtime as needed   fentaNYL  50 mcg/hour Commonly known as: DURAGESIC   1 patch, Every 3 days   fluocinolone acetonide 0.01% Oil otic oil  SMARTSIG:5 Drop(s) In Ear(s) Once a Week PRN   FREESTYLE INSULINX TEST test strip Generic drug: glucose blood  No dose, route, or frequency recorded.   Glucagon Emergency 1 MG Kit  Inject 1 mg into the skin as directed in the event of severe hypoglycemia.   hydrOXYzine  HCl 50 mg tablet Commonly known as: ATARAX   50 mg, Every 4 hours as needed   ibuprofen  200 mg tablet Commonly known as: ADVIL ,MOTRIN   400 mg, Every 6 hours as needed   lamoTRIgine  200 MG tablet Commonly known as: LAMICTAL   200 mg, At bedtime   lisinopril 10 mg tablet Commonly known as: PRINIVIL,ZESTRIL  20 mg, Oral, At bedtime, Please hold lisinopril if systolic blood pressure less than 110. QHS   lithium  carbonate 300 mg ER tablet Commonly known as: LITHOBID  900 mg, At bedtime   LUNESTA 3 MG tablet Generic drug: eszopiclone  SMARTSIG:0.5-1 Tablet(s) By Mouth Every Night PRN   metFORMIN ER 500 mg  24 hr tablet Commonly known as: GLUCOPHAGE-XR  TAKE 2 TABLETS BY MOUTH EVERY DAY WITH BREAKFAST   miconazole 2 % cream Commonly known as: ANTI-FUNGAL  Topical, 2 times a day   naloxone nasal spray (TAKE HOME PACK) Commonly known as: NARCAN  4 mg, As needed   nebivolol 2.5 mg tablet Commonly known as: BYSTOLIC  TAKE 1 TABLET DAILY. HOLD NEBIVOLOL IF SYSTOLIC BP IS LESS THAN 105 OR HR IS LESS THAN 60.   NOVOLOG  FLEXPEN 100 UNIT/ML injection Generic drug: insulin  aspart  30 Units, Subcutaneous, 15 minutes before meals   nystatin powder Commonly known as: MYCOSTATIN  APPLY TO AFFECTED AREA 3 TIMES A DAY   OLANZapine  10 mg tablet Commonly known as: ZYPREXA   10 mg, At bedtime   ondansetron  4 mg disintegrating tablet Commonly known as: ZOFRAN -ODT  4 mg, Daily as needed   oxyCODONE  HCl 15 mg immediate release tablet Commonly known as: ROXICODONE   15 mg, Every 6 hours as needed   testosterone cypionate 200 MG/ML injection Commonly known as: DEPOTESTOTERONE CYPIONATE  150 mg, Intramuscular, Every 14 days   tiZANidine  4 mg tablet Commonly known as: ZANAFLEX   Take by mouth.   traZODone 50 mg tablet Commonly known as: DESYREL  SMARTSIG:0.5-1 Tablet(s) By Mouth Twice Daily PRN   TRESIBA  FLEXTOUCH 200 UNIT/ML injection Generic drug: Insulin  Degludec  120 Units, Subcutaneous, Daily   UNABLE TO FIND  CPAP machine       Modified Medications      Instructions  atorvastatin  20 mg tablet Commonly known as: LIPITOR What changed:  how much to take how to take this when to take this  TAKE 1 TABLET BY MOUTH EVERY DAY   DULoxetine HCl 20 mg capsule Commonly known as: CYMBALTA What changed: when to take this  20 mg, Oral, Daily   FARXIGA 10 MG tablet Generic drug: dapagliflozin What changed: when to take this  10 mg, Oral, Daily   OZEMPIC (2 MG/DOSE) 8 MG/3ML Sopn pen Generic drug: Semaglutide (  2 MG/DOSE) What changed: additional instructions  2 mg,  Subcutaneous, Weekly   pantoprazole  sodium 40 mg tablet Commonly known as: PROTONIX  What changed:  when to take this additional instructions  40 mg, Oral, Daily       Discontinued Medications    Misc. Devices Misc Stopped by: Ozell Sat   NA sulfate-potassium sulfate-magnesium sulfate 17.5-3.13-1.6 GM/177ML Commonly known as: SUPREP BOWEL PREP Stopped by: Ozell Sat        This note was dictated with voice recognition software. Similar sounding words can inadvertently be transcribed and may not be corrected upon review.  *Some images could not be shown.

## 2024-03-29 ENCOUNTER — Other Ambulatory Visit: Payer: Self-pay

## 2024-03-29 ENCOUNTER — Emergency Department (HOSPITAL_COMMUNITY)
Admission: EM | Admit: 2024-03-29 | Discharge: 2024-03-29 | Disposition: A | Discharge status: Home / Self Care | Attending: Emergency Medicine | Admitting: Emergency Medicine

## 2024-03-29 ENCOUNTER — Encounter (HOSPITAL_COMMUNITY): Payer: Self-pay | Admitting: *Deleted

## 2024-03-29 DIAGNOSIS — R109 Unspecified abdominal pain: Secondary | ICD-10-CM | POA: Diagnosis present

## 2024-03-29 DIAGNOSIS — M62838 Other muscle spasm: Secondary | ICD-10-CM | POA: Insufficient documentation

## 2024-03-29 DIAGNOSIS — Z794 Long term (current) use of insulin: Secondary | ICD-10-CM | POA: Insufficient documentation

## 2024-03-29 LAB — URINALYSIS, ROUTINE W REFLEX MICROSCOPIC
Bacteria, UA: NONE SEEN
Bilirubin Urine: NEGATIVE
Glucose, UA: >=500 mg/dL — AB
Hgb urine dipstick: NEGATIVE
Ketones, ur: NEGATIVE mg/dL
Leukocytes,Ua: NEGATIVE
Nitrite: NEGATIVE
Protein, ur: NEGATIVE mg/dL
Specific Gravity, Urine: 1.02 (ref 1.005–1.030)
pH: 6 (ref 5.0–8.0)

## 2024-03-29 MED ORDER — METAXALONE 800 MG PO TABS: 800.0000 mg | ORAL_TABLET | Freq: Three times a day (TID) | ORAL | 0 refills | Status: DC

## 2024-03-29 MED ORDER — LIDOCAINE 5 % EX OINT
1.0000 | TOPICAL_OINTMENT | CUTANEOUS | 0 refills | Status: DC | PRN
Start: 2024-03-29 — End: 2024-06-09

## 2024-03-29 MED ORDER — METHOCARBAMOL 500 MG PO TABS
1000.0000 mg | ORAL_TABLET | Freq: Once | ORAL | Status: AC
  Administered 2024-03-29: 1000 mg via ORAL
  Filled 2024-03-29: qty 2

## 2024-03-29 MED ORDER — LIDOCAINE 5 % EX PTCH
1.0000 | MEDICATED_PATCH | CUTANEOUS | Status: DC
  Administered 2024-03-29: 1 via TRANSDERMAL
  Filled 2024-03-29: qty 1

## 2024-03-29 NOTE — ED Triage Notes (Signed)
 Pt with left flank pain since yesterday morning.  Pt states hx of kidney stone. Denies any blood in urine. Denies any N/V

## 2024-03-29 NOTE — ED Provider Notes (Signed)
 [facility] AT [facility] Provider Note   CSN: [id number] Arrival date & time: 03/29/24  1331     History  Chief Complaint  Patient presents with   Flank Pain    Angel Pope is a 48 y.o. male has medical history significant for low back pain, OAB, kidney stones presents today for left flank pain that began yesterday morning.  Patient denies hematuria, nausea, vomiting, dysuria, fever, chills, any other complaints at this time.  Patient states that the pain is worse with certain movements and cramping.   Flank Pain       Home Medications Prior to Admission medications   Medication Sig Start Date End Date Taking? Authorizing Provider  lidocaine  (XYLOCAINE ) 5 % ointment Apply 1 Application topically as needed. 03/29/24  Yes Selenne Coggin N, PA-C  metaxalone (SKELAXIN) 800 MG tablet Take 1 tablet (800 mg total) by mouth 3 (three) times daily. 03/29/24  Yes Summit Arroyave N, PA-C  atorvastatin (LIPITOR) 20 MG tablet Take 1 tablet by mouth daily. 06/14/19   [provider]  betamethasone dipropionate 0.05 % cream Apply 1 application topically daily as needed (itching).  02/24/20   [provider]  chlorhexidine (PERIDEX) 0.12 % solution Use as directed 15 mLs in the mouth or throat daily.  03/28/20   [provider]  clopidogrel (PLAVIX) 75 MG tablet Take 1 tablet by mouth daily. 06/23/23   [provider]  clotrimazole-betamethasone (LOTRISONE) cream Apply 1 application topically daily as needed (itching).  02/23/20   [provider]  Continuous Glucose Sensor (DEXCOM G6 SENSOR) MISC DISPENSE AND USE AS DIRECTED 07/06/19   [provider]  DULoxetine (CYMBALTA) 20 MG capsule Take by mouth. 06/26/23   [provider]  ESZOPICLONE 3 MG tablet Take 3 mg by mouth at bedtime.  03/23/20   [provider]  famotidine  (PEPCID ) 20 MG tablet Take 1 tablet (20 mg total) by mouth 2 (two) times daily.  04/13/20   Lyna Sandhoff, PA-C  FARXIGA 10 MG TABS tablet Take 10 mg by mouth every evening.  08/09/18   [provider]  fentaNYL  (DURAGESIC  - DOSED MCG/HR) 25 MCG/HR patch Place 1 patch (25 mcg total) onto the skin every 3 (three) days. 08/16/18   Vann, Jessica U, DO  gabapentin  (NEURONTIN ) 300 MG capsule Take 600 mg by mouth 3 (three) times daily. 10/06/17   [provider]  hydrOXYzine  (ATARAX /VISTARIL ) 10 MG tablet Take 10 mg by mouth 3 (three) times daily. 10/01/17   [provider]  hydrOXYzine  (ATARAX /VISTARIL ) 50 MG tablet Take 50 mg by mouth at bedtime as needed for anxiety.  03/23/20   [provider]  insulin  aspart protamine- aspart (NOVOLOG  MIX 70/30) (70-30) 100 UNIT/ML injection Inject 16 Units into the skin in the morning, at noon, and at bedtime. Sliding Scale    [provider]  LAMICTAL 200 MG tablet Take 200 mg by mouth at bedtime.  03/23/20   [provider]  lidocaine  (XYLOCAINE ) 5 % ointment Apply 1 application topically daily as needed for mild pain or moderate pain.  07/14/18   [provider]  lisinopril (ZESTRIL) 10 MG tablet Take by mouth. 06/14/19   [provider]  lithium carbonate (LITHOBID) 300 MG CR tablet Take 300 mg by mouth 2 (two) times daily. 03/20/20   [provider]  metFORMIN (GLUCOPHAGE-XR) 500 MG 24 hr tablet Take 500 mg by mouth 2 (two) times daily. 03/27/20   [provider]  MYRBETRIQ 25 MG TB24 tablet Take 25 mg by mouth every evening.  02/27/20   [provider]  nebivolol (BYSTOLIC) 2.5 MG tablet Take 2.5 mg by mouth daily.    [provider]  nortriptyline  (PAMELOR ) 25 MG capsule Take 25 mg by mouth at bedtime. 08/12/18   [provider]  nystatin (MYCOSTATIN/NYSTOP) powder Apply 1 application topically daily as needed (rash).  03/25/20   [provider]  OLANZapine (ZYPREXA) 10 MG tablet Take 10 mg by mouth at bedtime.    [provider]  omeprazole  (PRILOSEC) 20 MG capsule Take 20 mg by mouth 2 (two) times daily as needed (indigestion).  03/26/20   [provider]  ondansetron  (ZOFRAN ) 4 MG tablet Take 4 mg by mouth every 8 (eight) hours as needed for nausea or vomiting.  03/23/20   [provider]  Oxycodone  HCl 10 MG TABS Take 10 mg by mouth 4 (four) times daily as needed (pain).  03/28/20   [provider]  OZEMPIC, 1 MG/DOSE, 2 MG/1.5ML SOPN Inject 1 mL as directed once a week. Take on Tues 08/09/18   [provider]  QUEtiapine (SEROQUEL) 400 MG tablet Take 600 mg by mouth at bedtime.  03/23/20   [provider]  testosterone cypionate (DEPOTESTOSTERONE CYPIONATE) 200 MG/ML injection Inject 0.5 mLs into the muscle every 14 (fourteen) days. 04/27/18 06/29/23  [provider]  tiZANidine  (ZANAFLEX ) 4 MG tablet Take 4-6 mg by mouth every 6 (six) hours as needed for muscle spasms.  03/25/20   [provider]  traZODone (DESYREL) 50 MG tablet Take 0.5-1 tablets by mouth 2 (two) times daily as needed. 01/21/23   [provider]  TRESIBA  FLEXTOUCH 200 UNIT/ML SOPN Inject 100 Units into the skin every evening.  10/03/17   [provider]  venlafaxine  XR (EFFEXOR -XR) 75 MG 24 hr capsule Take 75 mg by mouth at bedtime.  04/19/16   [provider]  XYOSTED 75 MG/0.5ML SOAJ Inject 75 mg into the skin once a week. Take on Tues 03/27/20   [provider]      Allergies    Ciprofloxacin, Metronidazole, Aspartame and phenylalanine, Etodolac, Acetaminophen , and Sulfamethoxazole-trimethoprim    Review of Systems   Review of Systems  Genitourinary:  Positive for flank pain.    Physical Exam Updated Vital Signs BP 125/70 (BP Location: Right Arm)   Pulse 84   Temp 98 F (36.7 C) (Temporal)   Resp 16   Ht 6\' 4"  (1.93 m)   Wt (!) 210.5 kg   SpO2 98%   BMI 56.48 kg/m  Physical Exam Vitals and nursing note reviewed.   Constitutional:      General: He is not in acute distress.    Appearance: He is well-developed. He is obese. He is not ill-appearing, toxic-appearing or diaphoretic.  HENT:     Head: Normocephalic and atraumatic.     Right Ear: External ear normal.     Left Ear: External ear normal.     Mouth/Throat:     Mouth: Mucous membranes are moist.  Eyes:     Conjunctiva/sclera: Conjunctivae normal.  Cardiovascular:     Rate and Rhythm: Normal rate and regular rhythm.     Pulses: Normal pulses.  Pulmonary:     Effort: Pulmonary effort is normal. No respiratory distress.     Breath sounds: Normal breath sounds.  Abdominal:     Palpations: Abdomen is soft.     Tenderness: There is  no abdominal tenderness. There is no right CVA tenderness or left CVA tenderness.  Musculoskeletal:        General: Tenderness present. No swelling, deformity or signs of injury.       Arms:     Cervical back: Normal range of motion and neck supple.     Comments: Mild tenderness to palpation of the left-sided lateral abdominal musculature.  No ecchymosis, erythema, or deformity noted on exam.  Skin:    General: Skin is warm and dry.     Capillary Refill: Capillary refill takes less than 2 seconds.  Neurological:     General: No focal deficit present.     Mental Status: He is alert and oriented to person, place, and time.  Psychiatric:        Mood and Affect: Mood normal.     ED Results / Procedures / Treatments   Labs (all labs ordered are listed, but only abnormal results are displayed) Labs Reviewed  URINALYSIS, ROUTINE W REFLEX MICROSCOPIC - Abnormal; Notable for the following components:      Result Value   Glucose, UA >=500 (*)    All other components within normal limits    EKG None  Radiology No results found.  Procedures Procedures    Medications Ordered in ED Medications  methocarbamol  (ROBAXIN ) tablet 1,000 mg (has no administration in time range)  lidocaine  (LIDODERM ) 5 % 1 patch  (has no administration in time range)    ED Course/ Medical Decision Making/ A&P                                 Medical Decision Making Amount and/or Complexity of Data Reviewed Labs: ordered.   This patient presents to the ED for concern of right side pain differential diagnosis includes kidney stone, rib fracture, musculoskeletal pain, UTI, pyelonephritis   Additional history obtained   Additional history obtained from Electronic Medical Record External records from outside source obtained and reviewed including Care Everywhere   Lab Tests:  I Ordered, and personally interpreted labs.  The pertinent results include:  UA with > 500 glucose   Medicines ordered and prescription drug management:  I ordered medication including Robaxin  and Lidoderm  patch    I have reviewed the patients home medicines and have made adjustments as needed   Problem List / ED Course:  Considered for admission or further workup however patient's vital signs, physical exam, and labs are reassuring.  Patient's symptoms most likely due to muscle spasm/musculoskeletal pain.  Patient given short course of muscle relaxer and lidocaine  cream.  Patient also advised to take Tylenol  Motrin  as needed for pain.  Patient given return precautions.  I feel patient safe for discharge at this time.           Final Clinical Impression(s) / ED Diagnoses Final diagnoses:  Spasm of abdominal muscles of left side    Rx / DC Orders ED Discharge Orders          Ordered    lidocaine  (XYLOCAINE ) 5 % ointment  As needed        03/29/24 1431    metaxalone (SKELAXIN) 800 MG tablet  3 times daily        03/29/24 1431              Carie Charity, PA-C 03/29/24 1437    Jerilynn Montenegro, MD 03/30/24 2222

## 2024-03-29 NOTE — Discharge Instructions (Addendum)
 Today you were seen for left-sided muscular pain.  You have been prescribed a short course of muscle relaxers and lidocaine  ointment.  You may also use Tylenol  and Motrin  as needed for pain and inflammation.  Please return to the ED if you have fevers that are not controlled with Tylenol  Motrin , uncontrollable vomiting, or worsening of symptoms.  Thank you for letting us  treat you today. After reviewing your labs, I feel you are safe to go home. Please follow up with your PCP in the next several days and provide them with your records from this visit. Return to the Emergency Room if pain becomes severe or symptoms worsen.

## 2024-03-29 NOTE — ED Notes (Signed)
Pt teaching provided on medications that may cause drowsiness. Pt instructed not to drive or operate heavy machinery while taking the prescribed medication. Pt verbalized understanding.   Pt provided discharge instructions and prescription information. Pt was given the opportunity to ask questions and questions were answered.   

## 2024-05-18 NOTE — Progress Notes (Signed)
 Patient ID: Angel Pope  is a 48 y.o. y.o. male .  Chief Complaint  Patient presents with  . Neck Pain  . Back Pain  . Shoulder Pain    Right    VALIDATING PHYSICIAN: Selinda Fellows, DO    REQUESTING PHYSICIAN:  Sherwood Fitz, MD    REASON FOR CONSULTATION: Followup and re-evaluation.   HISTORY OF PRESENT ILLNESS: Angel Pope is a 48 year old gentleman who comes in today for follow-up and reevaluation.  He has been seen by Dr. Trudy and myself intermittently in between and most recently.  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  Does have continued neck pain as well with prior cervical fusion C5-C6 with ACDF.  Does have thoracic back pain complaints related to spondylosis with interventional therapies performed with varying degrees of success send discussed with surgery possible revision and extension of his fusion   He has continued to struggle with shoulder pains which he is all followed by orthopedics.  He was also struggling with increasing bladder issues and frequency burning in urination.  Has been diagnosed with urological disorder which is now being scheduled for surgery in September.  In the meantime we have talked about medical management again symptomatically which he has remained on the fentanyl  patches and oxycodone .  We did talk about drug and minimize his medications moving forward and going into surgery.  He does feel he will be able to do so with prior surgeries and procedures in the past has been able to manage with above regiment.  He otherwise remains appropriate and continues to be monitored here  Data reviewed on this encounter included notes since last visit from primary care and other consultants (including physical therapy and psychiatry/psychology where applicable), functional capacity evaluation/Oswestry Disability Index, McGill body diagram and VAS  pain score. If patient is maintained on chronic opiate therapy, the last medication agreement, urine drug test and [place]  Controlled Substance Database were independently reviewed.   Pain Assessment Pain Score  : 4 Pain Type: Chronic pain Pain Location: Back Pain Orientation: Lower Pain Frequency: Constant/Continuous Pain Onset: Ongoing Clinical Progression: Not changed     Past Medical History: Medical History[1]   Past Surgical History: Surgical History[2]   Review of Systems: 14 system ROS negative unless otherwise stated in the HPI.  Unless otherwise noted in this document if the patient is on chronic opiate therapy:   The patient attests that as a result of the medications, they have decreased pain, increased functional capacity , and improved quality of life. The patient's pain score since last visit reflect greater than a 30% reduction with the current prescribed opioid regimen when compared to the patient's baseline pain score without the use of opiate therapy. Patient notes a greater than 30% improvement in enjoyment of life and general activity secondary to the reduction in pain since initiation of opiate therapy with the current prescribed opioid regimen when compared to the patient's baseline score without the use of opiate therapy.  The patient notes no side effects from the medications prescribed by this clinic other than those stated in the HPI. The patient denies that the medications change their mood or affect. Patient has not demonstrated any aberrant behavior in regards to their medications and no aberrant behavior was noted at today's visit. Last urine drug screen was preformed within the last 12 months and was appropriate  The patient has been  prescribed opiates for greater than 6 weeks duration and is being seen for follow-up today from previous visit within the past 3 months. The patient has signed an opioid medication agreement within the last year, which is  documented in the record. Patient has been compliant with this medication agreement. The patient has completed an opiate risk assessment tool documented in the record. The [place]  Controlled Substance Database was reviewed at this visit and was appropriate and a copy was entered into the record.  Radiology Review: No new exams  Physical Exam:   VITAL SIGNS:  Vitals:   05/18/24 1118  BP: 123/80  BP Location: Left arm  Patient Position: Sitting  Pulse: 83  Temp: 98.2 F (36.8 C)  TempSrc: Temporal  SpO2: 97%  Weight: (!) 214 kg (472 lb)     GENERAL: Patient is an appropriately dressed and groomed, adult and in no acute distress, A&O x 3.  HEENT: Head is normocephalic, atraumatic. Pupils equal, round, reactive to light and accommodation. Pupils are not miotic.  NECK: Supple, trachea midline, no JVD, no lymphadenopathy, no thyromegaly.  MUSCULOSKELETAL: Able to rise from a sitting position, 5/5 strength, otherwise no edema.  NEUROLOGICAL: CN 2-12 grossly intact , no tremor appreciated, walks with non-antalgic gait.  SKIN: Intact, no clubbing, cyanosis, normal turgor  PSYCHIATRIC: Mood and affect, pleasant and appropriate. No sedation, no slurring of speech.  Assessment/Plan  1. Recurrence of lumbar radiculopathy, now affecting the left lower extremity. Previous right lower extremity radicular symptoms improved with a L5-S1 transforaminal ESI. 2. History of prior cervical fusion at C5-6 with ACDF.  3. 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  4. History of cervical radiculopathy and cervicogenic disk disease.  5. Extreme morbid obesity.  6. History of right shoulder pain.  7. History of diabetes.  8. Recent shingles with recurrence  Plan:  1.  I did review with the patient today most recently again  increasing pains which he is attributed to a lot of bladder issues and additional discomfort related to this.  He does continue with pains above as well of which he does manage on the fentanyl  patches and oxycodone .  Again we had discussions about medications especially with regards to long-term opiate management and postop pains and try to minimize long-term.  He will try to minimize this going into surgery  2.  Again we have discussed doing a shoulder cortisone injection but holding off secondary to upcoming surgery   3.  Fentanyl  patches 50 mcg reviewed every 72 hours #10 for a-month supply was reviewed with morphine equivalents also reviewed with the patient today and trying to avoid further escalation which she had been on higher dosages in the past, given a do not fill date July 29   4.  Continues on oxycodone  15 mg every 6 hours as well also in combination morphine equivalents exceeding 200 mg of morphine which trying to avoid further escalation of medications, #120 renewed and sent to the pharmacy with a do not fill date July 26  Does understand and does seem to have good insight   5.  Will schedule follow-up in 8 to 9 weeks, will call for medication renewals in between when appropriate  Patient's case reviewed with supervising physician, including opiate medication prescriptions for chronic pains and remains medically necessary and appropriate at this time. Will continue to monitor  as above.   Electronically signed: Jason Urso  05/18/2024  11:30 AM        [1] Past Medical History: Diagnosis Date  . BXO (balanitis xerotica obliterans)    Has plax that is growing inside his urethra and his bladder  . Morbid obesity (CMD) Resolved: [id number]   Morbid obesity  . Morbid obesity (CMD) Resolved: [id number]   Morbid obesity  [2] Past Surgical History: Procedure Laterality Date  . APPENDECTOMY  1998

## 2024-06-09 ENCOUNTER — Encounter (HOSPITAL_COMMUNITY): Payer: Self-pay | Admitting: *Deleted

## 2024-06-09 ENCOUNTER — Emergency Department (HOSPITAL_COMMUNITY)

## 2024-06-09 ENCOUNTER — Inpatient Hospital Stay (HOSPITAL_COMMUNITY)
Admission: EM | Admit: 2024-06-09 | Discharge: 2024-06-13 | DRG: 917 | Disposition: A | Discharge status: Home / Self Care | Attending: Internal Medicine | Admitting: Internal Medicine

## 2024-06-09 ENCOUNTER — Other Ambulatory Visit: Payer: Self-pay

## 2024-06-09 DIAGNOSIS — Z7984 Long term (current) use of oral hypoglycemic drugs: Secondary | ICD-10-CM | POA: Diagnosis not present

## 2024-06-09 DIAGNOSIS — T56891A Toxic effect of other metals, accidental (unintentional), initial encounter: Secondary | ICD-10-CM | POA: Diagnosis not present

## 2024-06-09 DIAGNOSIS — Z794 Long term (current) use of insulin: Secondary | ICD-10-CM | POA: Diagnosis not present

## 2024-06-09 DIAGNOSIS — E872 Acidosis, unspecified: Secondary | ICD-10-CM | POA: Diagnosis present

## 2024-06-09 DIAGNOSIS — Z833 Family history of diabetes mellitus: Secondary | ICD-10-CM | POA: Diagnosis not present

## 2024-06-09 DIAGNOSIS — Z888 Allergy status to other drugs, medicaments and biological substances status: Secondary | ICD-10-CM

## 2024-06-09 DIAGNOSIS — E1169 Type 2 diabetes mellitus with other specified complication: Secondary | ICD-10-CM | POA: Diagnosis present

## 2024-06-09 DIAGNOSIS — R197 Diarrhea, unspecified: Secondary | ICD-10-CM | POA: Diagnosis present

## 2024-06-09 DIAGNOSIS — E785 Hyperlipidemia, unspecified: Secondary | ICD-10-CM | POA: Diagnosis present

## 2024-06-09 DIAGNOSIS — Z8249 Family history of ischemic heart disease and other diseases of the circulatory system: Secondary | ICD-10-CM | POA: Diagnosis not present

## 2024-06-09 DIAGNOSIS — R410 Disorientation, unspecified: Secondary | ICD-10-CM | POA: Diagnosis present

## 2024-06-09 DIAGNOSIS — F1120 Opioid dependence, uncomplicated: Secondary | ICD-10-CM | POA: Diagnosis present

## 2024-06-09 DIAGNOSIS — Z6841 Body Mass Index (BMI) 40.0 and over, adult: Secondary | ICD-10-CM

## 2024-06-09 DIAGNOSIS — Z7902 Long term (current) use of antithrombotics/antiplatelets: Secondary | ICD-10-CM

## 2024-06-09 DIAGNOSIS — G928 Other toxic encephalopathy: Secondary | ICD-10-CM | POA: Diagnosis present

## 2024-06-09 DIAGNOSIS — Z7985 Long-term (current) use of injectable non-insulin antidiabetic drugs: Secondary | ICD-10-CM

## 2024-06-09 DIAGNOSIS — G8929 Other chronic pain: Secondary | ICD-10-CM | POA: Diagnosis present

## 2024-06-09 DIAGNOSIS — G4733 Obstructive sleep apnea (adult) (pediatric): Secondary | ICD-10-CM | POA: Diagnosis present

## 2024-06-09 DIAGNOSIS — Z882 Allergy status to sulfonamides status: Secondary | ICD-10-CM

## 2024-06-09 DIAGNOSIS — F32A Depression, unspecified: Secondary | ICD-10-CM | POA: Diagnosis present

## 2024-06-09 DIAGNOSIS — G4730 Sleep apnea, unspecified: Secondary | ICD-10-CM | POA: Diagnosis present

## 2024-06-09 DIAGNOSIS — Z981 Arthrodesis status: Secondary | ICD-10-CM

## 2024-06-09 DIAGNOSIS — E1165 Type 2 diabetes mellitus with hyperglycemia: Secondary | ICD-10-CM | POA: Diagnosis present

## 2024-06-09 DIAGNOSIS — F411 Generalized anxiety disorder: Secondary | ICD-10-CM | POA: Diagnosis present

## 2024-06-09 DIAGNOSIS — T43591A Poisoning by other antipsychotics and neuroleptics, accidental (unintentional), initial encounter: Principal | ICD-10-CM | POA: Diagnosis present

## 2024-06-09 DIAGNOSIS — R000 Tachycardia, unspecified: Secondary | ICD-10-CM | POA: Diagnosis present

## 2024-06-09 DIAGNOSIS — Z604 Social exclusion and rejection: Secondary | ICD-10-CM | POA: Diagnosis present

## 2024-06-09 DIAGNOSIS — R251 Tremor, unspecified: Secondary | ICD-10-CM

## 2024-06-09 DIAGNOSIS — N400 Enlarged prostate without lower urinary tract symptoms: Secondary | ICD-10-CM | POA: Diagnosis present

## 2024-06-09 DIAGNOSIS — Y92009 Unspecified place in unspecified non-institutional (private) residence as the place of occurrence of the external cause: Secondary | ICD-10-CM

## 2024-06-09 DIAGNOSIS — Z818 Family history of other mental and behavioral disorders: Secondary | ICD-10-CM

## 2024-06-09 DIAGNOSIS — E871 Hypo-osmolality and hyponatremia: Secondary | ICD-10-CM | POA: Diagnosis present

## 2024-06-09 DIAGNOSIS — N179 Acute kidney failure, unspecified: Secondary | ICD-10-CM | POA: Diagnosis present

## 2024-06-09 DIAGNOSIS — T56891D Toxic effect of other metals, accidental (unintentional), subsequent encounter: Secondary | ICD-10-CM | POA: Diagnosis not present

## 2024-06-09 DIAGNOSIS — Z79899 Other long term (current) drug therapy: Secondary | ICD-10-CM | POA: Diagnosis not present

## 2024-06-09 DIAGNOSIS — I10 Essential (primary) hypertension: Secondary | ICD-10-CM | POA: Diagnosis present

## 2024-06-09 DIAGNOSIS — G252 Other specified forms of tremor: Secondary | ICD-10-CM | POA: Diagnosis present

## 2024-06-09 DIAGNOSIS — Z825 Family history of asthma and other chronic lower respiratory diseases: Secondary | ICD-10-CM

## 2024-06-09 HISTORY — DX: Essential (primary) hypertension: I10

## 2024-06-09 HISTORY — DX: Type 2 diabetes mellitus without complications: E11.9

## 2024-06-09 LAB — CBC WITH DIFFERENTIAL/PLATELET
Abs Immature Granulocytes: 0.03 K/uL (ref 0.00–0.07)
Basophils Absolute: 0 K/uL (ref 0.0–0.1)
Basophils Relative: 0 %
Eosinophils Absolute: 0 K/uL (ref 0.0–0.5)
Eosinophils Relative: 0 %
HCT: 44.5 % (ref 39.0–52.0)
Hemoglobin: 14.1 g/dL (ref 13.0–17.0)
Immature Granulocytes: 0 %
Lymphocytes Relative: 10 %
Lymphs Abs: 1.1 K/uL (ref 0.7–4.0)
MCH: 28.4 pg (ref 26.0–34.0)
MCHC: 31.7 g/dL (ref 30.0–36.0)
MCV: 89.5 fL (ref 80.0–100.0)
Monocytes Absolute: 0.5 K/uL (ref 0.1–1.0)
Monocytes Relative: 4 %
Neutro Abs: 9.7 K/uL — ABNORMAL HIGH (ref 1.7–7.7)
Neutrophils Relative %: 86 %
Platelets: 298 K/uL (ref 150–400)
RBC: 4.97 MIL/uL (ref 4.22–5.81)
RDW: 13.6 % (ref 11.5–15.5)
WBC: 11.3 K/uL — ABNORMAL HIGH (ref 4.0–10.5)
nRBC: 0 % (ref 0.0–0.2)

## 2024-06-09 LAB — RAPID URINE DRUG SCREEN, HOSP PERFORMED
Amphetamines: NOT DETECTED
Barbiturates: NOT DETECTED
Benzodiazepines: NOT DETECTED
Cocaine: NOT DETECTED
Opiates: NOT DETECTED
Tetrahydrocannabinol: NOT DETECTED

## 2024-06-09 LAB — URINALYSIS, ROUTINE W REFLEX MICROSCOPIC
Bilirubin Urine: NEGATIVE
Glucose, UA: >=500 mg/dL — AB
Hgb urine dipstick: NEGATIVE
Ketones, ur: NEGATIVE mg/dL
Nitrite: NEGATIVE
Protein, ur: 30 mg/dL — AB
Specific Gravity, Urine: 1.015 (ref 1.005–1.030)
pH: 5 (ref 5.0–8.0)

## 2024-06-09 LAB — BASIC METABOLIC PANEL WITH GFR
Anion gap: 12 (ref 5–15)
BUN: 39 mg/dL — ABNORMAL HIGH (ref 6–20)
CO2: 19 mmol/L — ABNORMAL LOW (ref 22–32)
Calcium: 9 mg/dL (ref 8.9–10.3)
Chloride: 101 mmol/L (ref 98–111)
Creatinine, Ser: 2.22 mg/dL — ABNORMAL HIGH (ref 0.61–1.24)
GFR, Estimated: 36 mL/min — ABNORMAL LOW (ref >60)
Glucose, Bld: 172 mg/dL — ABNORMAL HIGH (ref 70–99)
Potassium: 4.2 mmol/L (ref 3.5–5.1)
Sodium: 132 mmol/L — ABNORMAL LOW (ref 135–145)

## 2024-06-09 LAB — BLOOD GAS, VENOUS
Acid-Base Excess: 2.7 mmol/L — ABNORMAL HIGH (ref 0.0–2.0)
Bicarbonate: 30.1 mmol/L — ABNORMAL HIGH (ref 20.0–28.0)
Drawn by: 66297
O2 Saturation: 25.3 %
Patient temperature: 37.6
pCO2, Ven: 58 mmHg (ref 44–60)
pH, Ven: 7.32 (ref 7.25–7.43)
pO2, Ven: <31 mmHg — CL (ref 32–45)

## 2024-06-09 LAB — HEPATIC FUNCTION PANEL
ALT: 19 U/L (ref 0–44)
AST: 39 U/L (ref 15–41)
Albumin: 3.8 g/dL (ref 3.5–5.0)
Alkaline Phosphatase: 91 U/L (ref 38–126)
Bilirubin, Direct: 0.2 mg/dL (ref 0.0–0.2)
Indirect Bilirubin: 0.7 mg/dL (ref 0.3–0.9)
Total Bilirubin: 0.9 mg/dL (ref 0.0–1.2)
Total Protein: 7.6 g/dL (ref 6.5–8.1)

## 2024-06-09 LAB — ETHANOL: Alcohol, Ethyl (B): <15 mg/dL (ref <15)

## 2024-06-09 LAB — CBG MONITORING, ED
Glucose-Capillary: 167 mg/dL — ABNORMAL HIGH (ref 70–99)
Glucose-Capillary: 106 mg/dL — ABNORMAL HIGH (ref 70–99)

## 2024-06-09 LAB — LITHIUM LEVEL: Lithium Lvl: 2.3 mmol/L (ref 0.60–1.20)

## 2024-06-09 LAB — AMMONIA: Ammonia: 30 umol/L (ref 9–35)

## 2024-06-09 LAB — GLUCOSE, CAPILLARY: Glucose-Capillary: 125 mg/dL — ABNORMAL HIGH (ref 70–99)

## 2024-06-09 MED ORDER — POLYETHYLENE GLYCOL 3350 17 G PO PACK: 17.0000 g | PACK | Freq: Every day | ORAL | Status: DC | PRN

## 2024-06-09 MED ORDER — SODIUM CHLORIDE 0.9 % IV SOLN
1.0000 g | Freq: Once | INTRAVENOUS | Status: AC
  Administered 2024-06-09: 1 g via INTRAVENOUS
  Filled 2024-06-09: qty 10

## 2024-06-09 MED ORDER — HEPARIN SODIUM (PORCINE) 5000 UNIT/ML IJ SOLN
5000.0000 [IU] | Freq: Three times a day (TID) | INTRAMUSCULAR | Status: DC
  Administered 2024-06-09 – 2024-06-13 (×11): 5000 [IU] via SUBCUTANEOUS
  Filled 2024-06-09 (×11): qty 1

## 2024-06-09 MED ORDER — ONDANSETRON HCL 4 MG/2ML IJ SOLN: 4.0000 mg | Freq: Four times a day (QID) | INTRAMUSCULAR | Status: DC | PRN

## 2024-06-09 MED ORDER — INSULIN ASPART 100 UNIT/ML IJ SOLN: 0.0000 [IU] | Freq: Every day | INTRAMUSCULAR | Status: DC

## 2024-06-09 MED ORDER — SODIUM CHLORIDE 0.9 % IV BOLUS
1000.0000 mL | Freq: Once | INTRAVENOUS | Status: AC
  Administered 2024-06-09: 1000 mL via INTRAVENOUS

## 2024-06-09 MED ORDER — SODIUM CHLORIDE 0.9 % IV SOLN: INTRAVENOUS | Status: DC

## 2024-06-09 MED ORDER — OXYCODONE HCL 5 MG PO TABS
15.0000 mg | ORAL_TABLET | Freq: Four times a day (QID) | ORAL | Status: DC | PRN
  Administered 2024-06-09 – 2024-06-13 (×12): 15 mg via ORAL
  Filled 2024-06-09 (×12): qty 3

## 2024-06-09 MED ORDER — ATORVASTATIN CALCIUM 10 MG PO TABS
20.0000 mg | ORAL_TABLET | Freq: Every day | ORAL | Status: DC
  Administered 2024-06-09 – 2024-06-12 (×4): 20 mg via ORAL
  Filled 2024-06-09 (×4): qty 2

## 2024-06-09 MED ORDER — INSULIN GLARGINE-YFGN 100 UNIT/ML ~~LOC~~ SOLN
40.0000 [IU] | Freq: Every evening | SUBCUTANEOUS | Status: DC
  Administered 2024-06-09 – 2024-06-12 (×4): 40 [IU] via SUBCUTANEOUS
  Filled 2024-06-09 (×5): qty 0.4

## 2024-06-09 MED ORDER — INSULIN ASPART 100 UNIT/ML IJ SOLN
0.0000 [IU] | Freq: Three times a day (TID) | INTRAMUSCULAR | Status: DC
  Administered 2024-06-10 (×2): 2 [IU] via SUBCUTANEOUS
  Administered 2024-06-10 – 2024-06-11 (×3): 3 [IU] via SUBCUTANEOUS
  Administered 2024-06-11: 2 [IU] via SUBCUTANEOUS
  Administered 2024-06-12 (×2): 3 [IU] via SUBCUTANEOUS
  Administered 2024-06-13: 2 [IU] via SUBCUTANEOUS

## 2024-06-09 MED ORDER — CLOPIDOGREL BISULFATE 75 MG PO TABS
75.0000 mg | ORAL_TABLET | Freq: Every day | ORAL | Status: DC
  Administered 2024-06-10 – 2024-06-13 (×4): 75 mg via ORAL
  Filled 2024-06-09 (×4): qty 1

## 2024-06-09 MED ORDER — ONDANSETRON HCL 4 MG PO TABS: 4.0000 mg | ORAL_TABLET | Freq: Four times a day (QID) | ORAL | Status: DC | PRN

## 2024-06-09 NOTE — Assessment & Plan Note (Addendum)
 Creatinine 2.2, baseline 0.8-1. -1 L bolus given, continue N/s 125cc/hr x 20hrs

## 2024-06-09 NOTE — Assessment & Plan Note (Addendum)
 Reports some labile readings on his glucometer.   - SSI- M - HGbA1c - He is on Tresiba  120 units nightly, resume at reduced dose 40 units nightly - Also on 70-30 3 times daily-hold - Also on Metformin, Ozempic, Farxiga, wegovy

## 2024-06-09 NOTE — Assessment & Plan Note (Signed)
 History of anxiety and depression.  On lithium , gabapentin , Lamictal  and lithium .  Also on chronic pain medications.

## 2024-06-09 NOTE — ED Notes (Signed)
 Pt unable to provide urine sample at this time. Bladder scan done, showed 0

## 2024-06-09 NOTE — H&P (Signed)
 History and Physical    Angel Pope FMW:[id number] DOB: 1976/06/16 DOA: 06/09/2024  PCP: Reita Locus, MD   Patient coming from: Home  I have personally briefly reviewed patient's old medical records in [facility]  Chief Complaint: AMS  HPI: Angel Pope is a 48 y.o. male with medical history significant for morbid obesity, diabetes mellitus, hypertension, depression, OSA. Patient was brought to the ED with reports of occultly concentrating, shakiness.  Patient's brother noticed the tremor last night, took his upper and lower extremities, that has persisted through today.  Patient tells me this has improved in the ED.  On my evaluation, mental status is intact.  He reports intermittent episodes of vomiting that is unchanged from baseline.  No diarrhea.  His dose of lithium  was recently increased to help with his anxiety.  ED Course: Temperature 99.6.  Heart rate 77-107, respiratory rate 10 - 24.  Blood pressure systolic 103-140.  O2 sats 97% on room air. Creatinine elevated at 2.2.  Elevated lithium  level 2.3. UA with trace leukocytes and rare bacteria. Head CT was initially ordered, but due to patient's habitus this could not be done. Nephrology was consulted, admit here, hydrate, trend lithium  levels, can dialyze here in a.m if needed (see note). Poison control-recommended IV fluids, every 6 hourly lithium  check, talk to nephrology for consideration for dialysis. 1 L bolus given.  IV ceftriaxone  given.  Review of Systems: As per HPI all other systems reviewed and negative.  Past Medical History:  Diagnosis Date   Anal fissure    Anxiety    BPH (benign prostatic hyperplasia)    Depression    sees Dr. Elna Lo    Diabetes mellitus without complication (HCC)    Fever of unknown origin    seeing Dr. Chyrl Fenton    Hypertension    Morbid obesity (HCC)    Nausea    chronic- Dr Avram   Overactive bladder    Overactive bladder    Pain, low back     Plantar fasciitis    Sleep apnea    uses cpap   Ureterolithiasis     Past Surgical History:  Procedure Laterality Date   ANAL SPHINCTEROTOMY     dr cornett 06/04/09   APPENDECTOMY     CERVICAL FUSION  2017   ESOPHAGOGASTRODUODENOSCOPY     dr avram - normal   lab band  06/06/2008   dr mikell     reports that he has never smoked. He has never used smokeless tobacco. He reports that he does not drink alcohol and does not use drugs.  Allergies  Allergen Reactions   Acetaminophen  Hives, Itching and Nausea And Vomiting   Aspartame Hives, Nausea And Vomiting and Nausea Only    Headache   Ciprofloxacin Hives and Dermatitis   Sulfa Antibiotics Hives and Nausea Only   Etodolac Other (See Comments)    Caused legs to ache   Metronidazole Nausea And Vomiting   Phenylalanine Hives and Nausea And Vomiting    Headache   Sulfamethoxazole-Trimethoprim Itching    Family History  Problem Relation Age of Onset   Cancer Father        colon   Depression Other    Hypertension Other    Diabetes Other    COPD Other        adrenal tumors   Cystic fibrosis Other        paternall cousins   Inflammatory bowel disease Other  paternall aunt   Prior to Admission medications   Medication Sig Start Date End Date Taking? Authorizing Provider  atorvastatin  (LIPITOR) 20 MG tablet Take 1 tablet by mouth daily. 06/14/19  Yes [provider]  betamethasone dipropionate 0.05 % cream Apply 1 application topically daily as needed (itching).  02/24/20  Yes [provider]  chlorhexidine (PERIDEX) 0.12 % solution Use as directed 15 mLs in the mouth or throat daily.  03/28/20  Yes [provider]  clopidogrel  (PLAVIX ) 75 MG tablet Take 1 tablet by mouth daily. 06/23/23  Yes [provider]  clotrimazole-betamethasone (LOTRISONE) cream Apply 1 application topically daily as needed (itching).  02/23/20  Yes [provider]  ESZOPICLONE 3 MG tablet Take 3 mg by  mouth at bedtime as needed (sleep). 03/23/20  Yes [provider]  famotidine  (PEPCID ) 20 MG tablet Take 1 tablet (20 mg total) by mouth 2 (two) times daily. Patient taking differently: Take 20 mg by mouth 2 (two) times daily as needed for heartburn or indigestion. 04/13/20  Yes Desiderio Chew, PA-C  FARXIGA 10 MG TABS tablet Take 10 mg by mouth every evening.  08/09/18  Yes [provider]  fentaNYL  (DURAGESIC ) 50 MCG/HR Place 1 patch onto the skin every 3 (three) days. 02/18/22 07/01/24 Yes [provider]  gabapentin  (NEURONTIN ) 300 MG capsule Take 600 mg by mouth 3 (three) times daily. 10/06/17  Yes [provider]  hydrOXYzine  (ATARAX /VISTARIL ) 10 MG tablet Take 10 mg by mouth 3 (three) times daily. 10/01/17  Yes [provider]  hydrOXYzine  (ATARAX /VISTARIL ) 50 MG tablet Take 50 mg by mouth at bedtime as needed for anxiety.  03/23/20  Yes [provider]  ibuprofen  (ADVIL ) 200 MG tablet Take 600 mg by mouth every 6 (six) hours as needed for mild pain (pain score 1-3).   Yes [provider]  insulin  aspart protamine- aspart (NOVOLOG  MIX 70/30) (70-30) 100 UNIT/ML injection Inject 16 Units into the skin in the morning, at noon, and at bedtime. Sliding Scale   Yes [provider]  LAMICTAL  200 MG tablet Take 200 mg by mouth at bedtime.  03/23/20  Yes [provider]  lisinopril (ZESTRIL) 10 MG tablet Take 10 mg by mouth daily. 06/14/19  Yes [provider]  lithium  carbonate (LITHOBID) 300 MG CR tablet Take 300 mg by mouth 2 (two) times daily. 03/20/20  Yes [provider]  metFORMIN (GLUCOPHAGE-XR) 500 MG 24 hr tablet Take 500 mg by mouth 2 (two) times daily. 03/27/20  Yes [provider]  nebivolol (BYSTOLIC) 2.5 MG tablet Take 2.5 mg by mouth daily.   Yes [provider]  OLANZapine  (ZYPREXA ) 10 MG tablet Take 10 mg by mouth at bedtime as needed (sleep).   Yes [provider]   omeprazole  (PRILOSEC) 20 MG capsule Take 20 mg by mouth 2 (two) times daily as needed (heartburn, indigestion). 03/26/20  Yes [provider]  ondansetron  (ZOFRAN ) 4 MG tablet Take 4 mg by mouth every 8 (eight) hours as needed for vomiting. 03/23/20  Yes [provider]  oxyCODONE  (ROXICODONE ) 15 MG immediate release tablet Take 15 mg by mouth every 6 (six) hours as needed for pain.   Yes [provider]  OZEMPIC, 2 MG/DOSE, 8 MG/3ML SOPN Inject 2 mg into the skin once a week. 03/25/24  Yes [provider]  pantoprazole  (PROTONIX ) 20 MG tablet Take 20 mg by mouth daily as needed for heartburn or indigestion. 02/09/24  Yes [provider]  QUEtiapine (SEROQUEL) 400 MG tablet Take 600 mg by mouth at bedtime as needed (sleep). 03/23/20  Yes [provider]  testosterone cypionate (DEPOTESTOSTERONE CYPIONATE) 200 MG/ML injection Inject 0.5 mLs into the muscle every 14 (fourteen) days. Wednesday 04/27/18 06/09/24 Yes [provider]  TRESIBA  FLEXTOUCH 200 UNIT/ML SOPN Inject 100 Units into the skin every evening.  10/03/17  Yes [provider]  WEGOVY 2.4 MG/0.75ML SOAJ SQ injection Inject 0.75 mLs into the skin every Friday. 05/10/24  Yes [provider]  Continuous Glucose Sensor (DEXCOM G6 SENSOR) MISC DISPENSE AND USE AS DIRECTED 07/06/19   [provider]    Physical Exam: Vitals:   06/09/24 1624 06/09/24 1625 06/09/24 2040 06/09/24 2041  BP:  (!) 140/87 137/60   Pulse:  (!) 107  82  Resp:  20 10 15   Temp:  99.6 F (37.6 C)    TempSrc:  Oral    SpO2:  99%  97%  Weight: (!) 290.3 kg     Height: 6' 4 (1.93 m)       Constitutional: NAD, calm, comfortable Vitals:   06/09/24 1624 06/09/24 1625 06/09/24 2040 06/09/24 2041  BP:  (!) 140/87 137/60   Pulse:  (!) 107  82  Resp:  20 10 15   Temp:  99.6 F (37.6 C)    TempSrc:  Oral    SpO2:  99%  97%  Weight: (!) 290.3 kg     Height: 6' 4 (1.93 m)      Eyes:  PERRL, lids and conjunctivae normal ENMT: Mucous membranes are moist.  Neck: normal, supple, no masses, no thyromegaly Respiratory: clear to auscultation bilaterally, no wheezing, no crackles. Normal respiratory effort. No accessory muscle use.  Cardiovascular: Regular rate and rhythm, no murmurs / rubs / gallops. No extremity edema.   Abdomen: no tenderness, no masses palpated. No hepatosplenomegaly. Bowel sounds positive.  Musculoskeletal: no clubbing / cyanosis. No joint deformity upper and lower extremities.   Skin: no rashes, lesions, ulcers. No induration Neurologic: No facial asymmetry, moving extremity spontaneously, speech fluent, mild occasional involuntary jerks to upper extremities. Psychiatric: Normal judgment and insight. Alert and oriented x 3. Normal mood.   Labs on Admission: I have personally reviewed following labs and imaging studies  CBC: Recent Labs  Lab 06/09/24 1649  WBC 11.3*  NEUTROABS 9.7*  HGB 14.1  HCT 44.5  MCV 89.5  PLT 298   Basic Metabolic Panel: Recent Labs  Lab 06/09/24 1649  NA 132*  K 4.2  CL 101  CO2 19*  GLUCOSE 172*  BUN 39*  CREATININE 2.22*  CALCIUM  9.0   GFR: Estimated Creatinine Clearance: 98.9 mL/min (A) (by C-G formula based on SCr of 2.22 mg/dL (H)). Liver Function Tests: Recent Labs  Lab 06/09/24 1708  AST 39  ALT 19  ALKPHOS 91  BILITOT 0.9  PROT 7.6  ALBUMIN 3.8   No results for input(s): LIPASE, AMYLASE in the last 168 hours. Recent Labs  Lab 06/09/24 1708  AMMONIA 30    CBG: Recent Labs  Lab 06/09/24 1626 06/09/24 1952  GLUCAP 167* 106*   Urine analysis:    Component Value Date/Time   COLORURINE YELLOW 06/09/2024 1657   APPEARANCEUR HAZY (A) 06/09/2024 1657   LABSPEC 1.015 06/09/2024 1657   PHURINE 5.0 06/09/2024 1657   GLUCOSEU >=500 (A) 06/09/2024 1657   HGBUR NEGATIVE 06/09/2024 1657   HGBUR negative 05/15/2010 1552   BILIRUBINUR NEGATIVE 06/09/2024 1657   KETONESUR NEGATIVE  06/09/2024 1657  PROTEINUR 30 (A) 06/09/2024 1657   UROBILINOGEN 0.2 03/27/2013 1304   NITRITE NEGATIVE 06/09/2024 1657   LEUKOCYTESUR TRACE (A) 06/09/2024 1657    Radiological Exams on Admission: No results found.  EKG: Independently reviewed.  Sinus rhythm, rate 82, QTc 466.  Assessment/Plan Principal Problem:   Lithium  toxicity Active Problems:   AKI (acute kidney injury) (HCC)   Anxiety state   Sleep apnea   Morbid obesity (HCC)   Type 2 diabetes mellitus with other specified complication (HCC)   Chronic pain  Assessment and Plan: * Lithium  toxicity Patient's lithium  dose was recently increased-currently on 300 mg twice daily.  Symptomatic with neurologic symptoms-tremors, difficulty with word finding, AKI. -Poison control-hydrate, check lithium  every 6 hourly, consult nephrology for consideration of dialysis. - Per nephrology Dr. Marti to admit here, trend lithium  levels,  -Trend lithium  q6h - N/s 125 cc/hr x 15 hrs - Resume oxycodone  as requested by patient, hold Lamictal , gabapentin .  Reports he no longer takes Seroquel or hydroxyzine .  AKI (acute kidney injury) (HCC) Creatinine 2.2, baseline 0.8-1. -1 L bolus given, continue N/s 125cc/hr x 20hrs  Acute metabolic encephalopathy - 2/2 lithium  toxicity.  At the time of my evaluation, mental status is intact, he has mild occasional tremors to his upper extremities.  CT could not be obtained due to body habitus-defer imaging.  UA-trace leuks rare bacteria.  Not convincing for UTI. - IV ceftriaxone  started in the ED, urine cultures obtained, hold off on further antibiotics - Hold psychoactive medications for now, resume oxycodone  as requested by patient  Chronic pain Chronic neck pain. -Resume oxycodone  15 mg Q6 hourly as needed  Type 2 diabetes mellitus with other specified complication (HCC) Reports some labile readings on his glucometer.   - SSI- M - HGbA1c - He is on Tresiba  120 units nightly, resume at  reduced dose 40 units nightly - Also on 70-30 3 times daily-hold - Also on Metformin, Ozempic, Farxiga, wegovy  Sleep apnea CPAP nightly  Anxiety state History of anxiety and depression.  On lithium , gabapentin , Lamictal  and lithium .  Also on chronic pain medications.   DVT prophylaxis: Heparin  Code Status: Full code Family Communication: ~ 2 day Disposition Plan: Inpt Tele Consults called: Nephrology Admission status: Inpt Tele  I certify that at the point of admission it is my clinical judgment that the patient will require inpatient hospital care spanning beyond 2 midnights from the point of admission due to high intensity of service, high risk for further deterioration and high frequency of surveillance required.   Author: Tully FORBES Carwin, MD 06/09/2024 10:19 PM  For on call review [email or web address].

## 2024-06-09 NOTE — Assessment & Plan Note (Signed)
 CPAP nightly

## 2024-06-09 NOTE — Assessment & Plan Note (Signed)
 Chronic neck pain. -Resume oxycodone  15 mg Q6 hourly as needed

## 2024-06-09 NOTE — Consult Note (Signed)
 Nephrology Consult   Requesting provider: [facility] requesting consult: Emergency department Reason for consult: AKI, lithium  toxicity   Assessment/Recommendations: Angel Pope is a/an 48 y.o. male with a past medical history DM2, obesity, OSA, BPH, chronic pain who presents with lithium  toxicity  Lithium  toxicity: Elevated level along with some neurological compromise although symptoms mild.  Given mild symptoms and not severely elevated level we can start with hydration.  Hopefully will not require dialysis as long as kidney function is improving - Can stay at [facility] given lower level and could do dialysis tomorrow more urgently if needed - Lithium  level every 6 hours - IVFs w/ NS at 125cc/hr could consider increase - Continue to monitor neurological status - Consider dialysis if the patient's kidney function worsens significantly, lithium  level increases further, more severe neurological compromise occurs - Hold potentially neurological impacting medications including nortriptyline , gabapentin , lithium , and other psychiatric meds - Likely can tolerate some of his home opioid medications  AKI: Mild AKI at this time likely related with lithium  toxicity.  Bladder scan with 0 urine after voiding - Management of toxicity as above -Continue to monitor daily Cr, Dose meds for GFR -Monitor Daily I/Os, Daily weight  -Maintain MAP>65 for optimal renal perfusion.  -Avoid nephrotoxic medications including NSAIDs -Use synthetic opioids (Fentanyl /Dilaudid ) if needed -Currently no indication for HD  Hypertension: Hold home lisinopril   Uncontrolled Diabetes Mellitus Type 2 with Hyperglycemia: Management per primary team  Metabolic acidosis: Mild associated with AKI.  Continue to monitor  Hyponatremia: Mild associated with AKI   Recommendations conveyed to primary service.    Jayson JINNY Player [facility] 06/09/2024 8:48  PM   _____________________________________________________________________________________ CC: confusion  History of Present Illness: Angel Pope is a/an 48 y.o. male with a past medical history of DM2, obesity, OSA, BPH, chronic pain who presents with confusion.  The patient states he felt his normal self until about lunchtime yesterday.  He started to notice tremors and sometimes jerking of his extremities.  He also noticed confusion (for example had a difficult time writing a sentence on a piece of paper).  He was able to walk normally and did not feel as though he was about to fall down.  Also denies fevers, chills, shortness of breath, chest pain, nausea, vomiting, dysuria, hematuria.  He did notice that his urine output was lower.  He has some diarrhea chronically that is unchanged.  He has chronic pain and psychiatric illness and is on numerous medications to help with that including Lamictal , lithium , Zyprexa , nortriptyline , Seroquel, Effexor , gabapentin , oxycodone .  He said that his lithium  dose was increased recently.  In the emergency department he was found to have an AKI with creatinine of 2.2.  There was concern about lithium  toxicity.  Level returned at 2.3.  Case discussed with poison control who recommended lithium  checks every 6 hours and IV fluids as well as discussion with nephrology regarding possible dialysis.   Medications:  Current Facility-Administered Medications  Medication Dose Route Frequency Provider Last Rate Last Admin   0.9 %  sodium chloride  infusion   Intravenous Continuous Fireman Bernardino, MD 125 mL/hr at 06/09/24 1830 New Bag at 06/09/24 1830   Current Outpatient Medications  Medication Sig Dispense Refill   atorvastatin  (LIPITOR) 20 MG tablet Take 1 tablet by mouth daily.     betamethasone dipropionate 0.05 % cream Apply 1 application topically daily as needed (itching).      chlorhexidine (PERIDEX) 0.12 % solution Use as  directed 15 mLs in the mouth or  throat daily.      clopidogrel  (PLAVIX ) 75 MG tablet Take 1 tablet by mouth daily.     clotrimazole-betamethasone (LOTRISONE) cream Apply 1 application topically daily as needed (itching).      ESZOPICLONE 3 MG tablet Take 3 mg by mouth at bedtime as needed (sleep).     famotidine  (PEPCID ) 20 MG tablet Take 1 tablet (20 mg total) by mouth 2 (two) times daily. (Patient taking differently: Take 20 mg by mouth 2 (two) times daily as needed for heartburn or indigestion.) 30 tablet 0   FARXIGA 10 MG TABS tablet Take 10 mg by mouth every evening.      fentaNYL  (DURAGESIC ) 50 MCG/HR Place 1 patch onto the skin every 3 (three) days.     gabapentin  (NEURONTIN ) 300 MG capsule Take 600 mg by mouth 3 (three) times daily.  5   hydrOXYzine  (ATARAX /VISTARIL ) 10 MG tablet Take 10 mg by mouth 3 (three) times daily.  0   hydrOXYzine  (ATARAX /VISTARIL ) 50 MG tablet Take 50 mg by mouth at bedtime as needed for anxiety.      ibuprofen  (ADVIL ) 200 MG tablet Take 600 mg by mouth every 6 (six) hours as needed for mild pain (pain score 1-3).     insulin  aspart protamine- aspart (NOVOLOG  MIX 70/30) (70-30) 100 UNIT/ML injection Inject 16 Units into the skin in the morning, at noon, and at bedtime. Sliding Scale     LAMICTAL  200 MG tablet Take 200 mg by mouth at bedtime.      lisinopril (ZESTRIL) 10 MG tablet Take 10 mg by mouth daily.     lithium  carbonate (LITHOBID) 300 MG CR tablet Take 300 mg by mouth 2 (two) times daily.     metFORMIN (GLUCOPHAGE-XR) 500 MG 24 hr tablet Take 500 mg by mouth 2 (two) times daily.     nebivolol (BYSTOLIC) 2.5 MG tablet Take 2.5 mg by mouth daily.     OLANZapine  (ZYPREXA ) 10 MG tablet Take 10 mg by mouth at bedtime as needed (sleep).     omeprazole  (PRILOSEC) 20 MG capsule Take 20 mg by mouth 2 (two) times daily as needed (heartburn, indigestion).     ondansetron  (ZOFRAN ) 4 MG tablet Take 4 mg by mouth every 8 (eight) hours as needed for vomiting.     oxyCODONE  (ROXICODONE ) 15 MG immediate  release tablet Take 15 mg by mouth every 6 (six) hours as needed for pain.     OZEMPIC, 2 MG/DOSE, 8 MG/3ML SOPN Inject 2 mg into the skin once a week.     pantoprazole  (PROTONIX ) 20 MG tablet Take 20 mg by mouth daily as needed for heartburn or indigestion.     QUEtiapine (SEROQUEL) 400 MG tablet Take 600 mg by mouth at bedtime as needed (sleep).     testosterone cypionate (DEPOTESTOSTERONE CYPIONATE) 200 MG/ML injection Inject 0.5 mLs into the muscle every 14 (fourteen) days. Wednesday     TRESIBA  FLEXTOUCH 200 UNIT/ML SOPN Inject 100 Units into the skin every evening.   2   WEGOVY 2.4 MG/0.75ML SOAJ SQ injection Inject 0.75 mLs into the skin every Friday.     Continuous Glucose Sensor (DEXCOM G6 SENSOR) MISC DISPENSE AND USE AS DIRECTED       ALLERGIES Acetaminophen , Aspartame, Ciprofloxacin, Sulfa antibiotics, Etodolac, Metronidazole, Phenylalanine, and Sulfamethoxazole-trimethoprim  MEDICAL HISTORY Past Medical History:  Diagnosis Date   Anal fissure    Anxiety    BPH (benign prostatic hyperplasia)  Depression    sees Dr. Elna Lo    Diabetes mellitus without complication (HCC)    Fever of unknown origin    seeing Dr. Chyrl Fenton    Hypertension    Morbid obesity (HCC)    Nausea    chronic- Dr Avram   Overactive bladder    Overactive bladder    Pain, low back    Plantar fasciitis    Sleep apnea    uses cpap   Ureterolithiasis      SOCIAL HISTORY Social History   Socioeconomic History   Marital status: Married    Spouse name: Not on file   Number of children: Not on file   Years of education: Not on file   Highest education level: Not on file  Occupational History   Not on file  Tobacco Use   Smoking status: Never   Smokeless tobacco: Never  Vaping Use   Vaping status: Never Used  Substance and Sexual Activity   Alcohol use: No   Drug use: No   Sexual activity: Not on file  Other Topics Concern   Not on file  Social History Narrative   Not on  file   Social Drivers of Health   Financial Resource Strain: Low Risk  (02/15/2024)   Received from [facility]   Overall Financial Resource Strain (CARDIA)    Difficulty of Paying Living Expenses: Not very hard  Food Insecurity: No Food Insecurity (02/15/2024)   Received from [facility]   Hunger Vital Sign    Within the past 12 months, you worried that your food would run out before you got the money to buy more.: Never true    Within the past 12 months, the food you bought just didn't last and you didn't have money to get more.: Never true  Transportation Needs: No Transportation Needs (02/15/2024)   Received from [facility] - Transportation    Lack of Transportation (Medical): No    Lack of Transportation (Non-Medical): No  Physical Activity: Sufficiently Active (02/15/2024)   Received from [facility]   Exercise Vital Sign    On average, how many days per week do you engage in moderate to strenuous exercise (like a brisk walk)?: 5 days    On average, how many minutes do you engage in exercise at this level?: 150+ min  Stress: Stress Concern Present (02/15/2024)   Received from [facility] - Occupational Stress Questionnaire    Feeling of Stress : Rather much  Social Connections: Somewhat Isolated (02/15/2024)   Received from [facility]   Social Network    How would you rate your social network (family, work, friends)?: Restricted participation with some degree of social isolation  Intimate Partner Violence: Not At Risk (03/10/2024)   Received from [facility]   HITS    Over the last 12 months how often did your partner physically hurt you?: Never    Over the last 12 months how often did your partner insult you or talk down to you?: Never    Over the last 12 months how often did your partner threaten you with physical harm?: Never    Over the last 12 months how often did your partner scream or curse at you?: Never      FAMILY HISTORY Family History  Problem Relation Age of Onset   Cancer Father        colon   Depression Other  Hypertension Other    Diabetes Other    COPD Other        adrenal tumors   Cystic fibrosis Other        paternall cousins   Inflammatory bowel disease Other        paternall aunt      Review of Systems: 12 systems reviewed Otherwise as per HPI, all other systems reviewed and negative  Physical Exam: Vitals:   06/09/24 2040 06/09/24 2041  BP: 137/60   Pulse:  82  Resp: 10 15  Temp:    SpO2:  97%   No intake/output data recorded.  Intake/Output Summary (Last 24 hours) at 06/09/2024 2048 Last data filed at 06/09/2024 1859 Gross per 24 hour  Intake 100 ml  Output --  Net 100 ml   General: well-appearing, no acute distress HEENT: anicteric sclera, oropharynx clear without lesions CV: Normal rate, no rub, no lower extremity edema Lungs: clear to auscultation bilaterally, normal work of breathing Abd: soft, non-tender, non-distended Skin: no visible lesions or rashes Psych: alert, engaged, appropriate mood and affect Musculoskeletal: no obvious deformities Neuro: normal speech, no gross focal deficits, intermittent tremors of the hands and legs  Test Results Reviewed Lab Results  Component Value Date   NA 132 (L) 06/09/2024   K 4.2 06/09/2024   CL 101 06/09/2024   CO2 19 (L) 06/09/2024   BUN 39 (H) 06/09/2024   CREATININE 2.22 (H) 06/09/2024   CALCIUM  9.0 06/09/2024   ALBUMIN 3.8 06/09/2024    CBC Recent Labs  Lab 06/09/24 1649  WBC 11.3*  NEUTROABS 9.7*  HGB 14.1  HCT 44.5  MCV 89.5  PLT 298    I have reviewed all relevant outside healthcare records related to the patient's current hospitalization

## 2024-06-09 NOTE — ED Provider Notes (Signed)
  EMERGENCY DEPARTMENT AT [facility] Provider Note   CSN: [id number] Arrival date & time: 06/09/24  1616     Patient presents with: Altered Mental Status   Angel Pope is a 48 y.o. male.  {Add pertinent medical, surgical, social history, OB history to YEP:67052}  Altered Mental Status Presenting symptoms: confusion   Patient presents for altered mental status.  Medical history includes DM, obesity, sleep apnea, anxiety, depression, BPH.  He is followed by a pain medicine clinic.  He is on fentanyl  patches and every 6 oxycodone  for chronic pain.  He is also prescribed Lamictal , lithium , Zyprexa , nortriptyline , Seroquel, Zanaflex , Effexor . He takes insulin  and oral medications for his diabetes.  He recently had increased dose of his lithium  prescribed.  He denies any other recent medication changes.  He has been having some labile readings on his glucometer.  Starting last night, his brother noticed a tremor.  This tremor has continued today.  He has had difficulty with concentrating and word finding today.  Patient denies any difficulty walking but rather feels like he has been a bit off balance.     Prior to Admission medications   Medication Sig Start Date End Date Taking? Authorizing Provider  atorvastatin  (LIPITOR) 20 MG tablet Take 1 tablet by mouth daily. 06/14/19   [provider]  betamethasone dipropionate 0.05 % cream Apply 1 application topically daily as needed (itching).  02/24/20   [provider]  chlorhexidine (PERIDEX) 0.12 % solution Use as directed 15 mLs in the mouth or throat daily.  03/28/20   [provider]  clopidogrel  (PLAVIX ) 75 MG tablet Take 1 tablet by mouth daily. 06/23/23   [provider]  clotrimazole-betamethasone (LOTRISONE) cream Apply 1 application topically daily as needed (itching).  02/23/20   [provider]  Continuous Glucose Sensor (DEXCOM G6 SENSOR) MISC DISPENSE AND USE AS  DIRECTED 07/06/19   [provider]  DULoxetine (CYMBALTA) 20 MG capsule Take by mouth. 06/26/23   [provider]  ESZOPICLONE 3 MG tablet Take 3 mg by mouth at bedtime.  03/23/20   [provider]  famotidine  (PEPCID ) 20 MG tablet Take 1 tablet (20 mg total) by mouth 2 (two) times daily. 04/13/20   Desiderio Chew, PA-C  FARXIGA 10 MG TABS tablet Take 10 mg by mouth every evening.  08/09/18   [provider]  fentaNYL  (DURAGESIC ) 50 MCG/HR Place 1 patch onto the skin every 3 (three) days. 02/18/22 07/01/24  [provider]  gabapentin  (NEURONTIN ) 300 MG capsule Take 600 mg by mouth 3 (three) times daily. 10/06/17   [provider]  hydrOXYzine  (ATARAX /VISTARIL ) 10 MG tablet Take 10 mg by mouth 3 (three) times daily. 10/01/17   [provider]  hydrOXYzine  (ATARAX /VISTARIL ) 50 MG tablet Take 50 mg by mouth at bedtime as needed for anxiety.  03/23/20   [provider]  insulin  aspart protamine- aspart (NOVOLOG  MIX 70/30) (70-30) 100 UNIT/ML injection Inject 16 Units into the skin in the morning, at noon, and at bedtime. Sliding Scale    [provider]  LAMICTAL  200 MG tablet Take 200 mg by mouth at bedtime.  03/23/20   [provider]  lamoTRIgine  (LAMICTAL ) 150 MG tablet Take 150 mg by mouth 2 (two) times daily. 03/28/24   [provider]  lidocaine  (XYLOCAINE ) 5 % ointment Apply 1 application topically daily as needed for mild pain or moderate pain.  07/14/18   [provider]  lidocaine  (XYLOCAINE )  5 % ointment Apply 1 Application topically as needed. 03/29/24   Keith, Kayla N, PA-C  lisinopril (ZESTRIL) 10 MG tablet Take by mouth. 06/14/19   [provider]  lithium  carbonate (LITHOBID) 300 MG CR tablet Take 300 mg by mouth 2 (two) times daily. 03/20/20   [provider]  metaxalone  (SKELAXIN ) 800 MG tablet Take 1 tablet (800 mg total) by mouth 3 (three) times daily. 03/29/24   Keith,  Kayla N, PA-C  metFORMIN (GLUCOPHAGE-XR) 500 MG 24 hr tablet Take 500 mg by mouth 2 (two) times daily. 03/27/20   [provider]  MYRBETRIQ 25 MG TB24 tablet Take 25 mg by mouth every evening.  02/27/20   [provider]  nebivolol (BYSTOLIC) 2.5 MG tablet Take 2.5 mg by mouth daily.    [provider]  nortriptyline  (PAMELOR ) 25 MG capsule Take 25 mg by mouth at bedtime. 08/12/18   [provider]  nystatin (MYCOSTATIN/NYSTOP) powder Apply 1 application topically daily as needed (rash).  03/25/20   [provider]  OLANZapine  (ZYPREXA ) 10 MG tablet Take 10 mg by mouth at bedtime.    [provider]  omeprazole  (PRILOSEC) 20 MG capsule Take 20 mg by mouth 2 (two) times daily as needed (indigestion).  03/26/20   [provider]  ondansetron  (ZOFRAN ) 4 MG tablet Take 4 mg by mouth every 8 (eight) hours as needed for nausea or vomiting.  03/23/20   [provider]  oxyCODONE  (ROXICODONE ) 15 MG immediate release tablet Take 15 mg by mouth every 6 (six) hours as needed for pain.    [provider]  OZEMPIC, 2 MG/DOSE, 8 MG/3ML SOPN Inject 2 mg into the skin once a week. 03/25/24   [provider]  pantoprazole  (PROTONIX ) 20 MG tablet Take 20 mg by mouth daily. 02/09/24   [provider]  QUEtiapine (SEROQUEL) 400 MG tablet Take 600 mg by mouth at bedtime.  03/23/20   [provider]  testosterone cypionate (DEPOTESTOSTERONE CYPIONATE) 200 MG/ML injection Inject 0.5 mLs into the muscle every 14 (fourteen) days. 04/27/18 06/29/23  [provider]  tiZANidine  (ZANAFLEX ) 4 MG tablet Take 4-6 mg by mouth every 6 (six) hours as needed for muscle spasms.  03/25/20   [provider]  traZODone (DESYREL) 50 MG tablet Take 0.5-1 tablets by mouth 2 (two) times daily as needed. 01/21/23   [provider]  TRESIBA  FLEXTOUCH 200 UNIT/ML SOPN Inject 100 Units into the skin every evening.  10/03/17    [provider]  venlafaxine  XR (EFFEXOR -XR) 75 MG 24 hr capsule Take 75 mg by mouth at bedtime.  04/19/16   [provider]  WEGOVY 2.4 MG/0.75ML SOAJ SQ injection Inject 0.75 mLs into the skin once a week. 05/10/24   [provider]  XYOSTED 75 MG/0.5ML SOAJ Inject 75 mg into the skin once a week. Take on Tues 03/27/20   [provider]    Allergies: Acetaminophen , Aspartame, Ciprofloxacin, Sulfa antibiotics, Etodolac, Metronidazole, Phenylalanine, and Sulfamethoxazole-trimethoprim    Review of Systems  Neurological:  Positive for tremors.  Psychiatric/Behavioral:  Positive for confusion and decreased concentration.   All other systems reviewed and are negative.   Updated Vital Signs BP (!) 140/87 (BP Location: Right Wrist)   Pulse (!) 107   Temp 99.6 F (37.6 C) (Oral)   Resp 20   Ht 6' 4 (1.93 m)   Wt (!) 290.3 kg   SpO2 99%   BMI 77.90 kg/m   Physical  Exam Vitals and nursing note reviewed.  Constitutional:      General: He is not in acute distress.    Appearance: Normal appearance. He is well-developed. He is not ill-appearing, toxic-appearing or diaphoretic.  HENT:     Head: Normocephalic and atraumatic.     Right Ear: External ear normal.     Left Ear: External ear normal.     Nose: Nose normal.     Mouth/Throat:     Mouth: Mucous membranes are moist.  Eyes:     Extraocular Movements: Extraocular movements intact.     Conjunctiva/sclera: Conjunctivae normal.     Comments: Baseline esotropia  Cardiovascular:     Rate and Rhythm: Normal rate and regular rhythm.  Pulmonary:     Effort: Pulmonary effort is normal. No respiratory distress.  Abdominal:     General: There is no distension.     Palpations: Abdomen is soft.  Musculoskeletal:        General: No swelling. Normal range of motion.     Cervical back: Normal range of motion and neck supple.  Skin:    General: Skin is warm and dry.     Capillary Refill: Capillary refill  takes less than 2 seconds.  Neurological:     General: No focal deficit present.     Mental Status: He is alert and oriented to person, place, and time.     Cranial Nerves: Facial asymmetry present. No dysarthria.     Sensory: Sensation is intact. No sensory deficit.     Motor: Tremor present. No weakness, abnormal muscle tone or pronator drift.     Coordination: Coordination normal. Finger-Nose-Finger Test normal.  Psychiatric:        Mood and Affect: Mood normal.        Behavior: Behavior normal.     (all labs ordered are listed, but only abnormal results are displayed) Labs Reviewed  CBG MONITORING, ED - Abnormal; Notable for the following components:      Result Value   Glucose-Capillary 167 (*)    All other components within normal limits  CBC WITH DIFFERENTIAL/PLATELET  BASIC METABOLIC PANEL WITH GFR  URINALYSIS, ROUTINE W REFLEX MICROSCOPIC    EKG: None  Radiology: No results found.  {Document cardiac monitor, telemetry assessment procedure when appropriate:32947} Procedures   Medications Ordered in the ED - No data to display    {Click here for ABCD2, HEART and other calculators REFRESH Note before signing:1}                              Medical Decision Making Amount and/or Complexity of Data Reviewed Labs: ordered. Radiology: ordered.   This patient presents to the ED for concern of ***, this involves an extensive number of treatment options, and is a complaint that carries with it a high risk of complications and morbidity.  The differential diagnosis includes ***   Co morbidities / Chronic conditions that complicate the patient evaluation  ***   Additional history obtained:  Additional history obtained from EMR External records from outside source obtained and reviewed including ***   Lab Tests:  I Ordered, and personally interpreted labs.  The pertinent results include:  ***   Imaging Studies ordered:  I ordered imaging studies including  ***  I independently visualized and interpreted imaging which showed *** I agree with the radiologist interpretation   Cardiac Monitoring: / EKG:  The patient was maintained on a cardiac  monitor.  I personally viewed and interpreted the cardiac monitored which showed an underlying rhythm of: ***   Problem List / ED Course / Critical interventions / Medication management  Patient presents for mild confusion starting today.  On arrival in the ED, vital signs notable for mild tachycardia.  On exam, he is alert and oriented.  He does not have any focal deficits but does have a intention tremor in his upper extremities.  This is new for him.  Workup was initiated.*** I ordered medication including ***   Reevaluation of the patient after these medicines showed that the patient *** I have reviewed the patients home medicines and have made adjustments as needed   Consultations Obtained:  I requested consultation with the ***,  and discussed lab and imaging findings as well as pertinent plan - they recommend: ***   Social Determinants of Health:  ***   Test / Admission - Considered:  ***   {Document critical care time when appropriate  Document review of labs and clinical decision tools ie CHADS2VASC2, etc  Document your independent review of radiology images and any outside records  Document your discussion with family members, caretakers and with consultants  Document social determinants of health affecting pt's care  Document your decision making why or why not admission, treatments were needed:32947:::1}   Final diagnoses:  None    ED Discharge Orders     None

## 2024-06-09 NOTE — Assessment & Plan Note (Addendum)
 Patient's lithium  dose was recently increased-currently on 300 mg twice daily.  Symptomatic with neurologic symptoms-tremors, difficulty with word finding.  -Poison control-hydrate, check lithium  every 6 hourly, consult nephrology for consideration of dialysis. - Per nephrology Dr. Marti to admit here, trend lithium  levels,  -Trend lithium  q6h - N/s 125 cc/hr x 15 hrs - Resume oxycodone  as requested by patient, hold Lamictal , gabapentin .  Reports he no longer takes Seroquel or hydroxyzine .

## 2024-06-09 NOTE — ED Triage Notes (Addendum)
 Pt states cognitive issues, hard to focus, shaky.  Pt last ate this morning. Issues with blood sugars, states been running low. C/o back and neck pain, chronic at times. Pt denies any head injury.

## 2024-06-10 DIAGNOSIS — T56891A Toxic effect of other metals, accidental (unintentional), initial encounter: Secondary | ICD-10-CM | POA: Diagnosis not present

## 2024-06-10 DIAGNOSIS — E1169 Type 2 diabetes mellitus with other specified complication: Secondary | ICD-10-CM | POA: Diagnosis not present

## 2024-06-10 DIAGNOSIS — N179 Acute kidney failure, unspecified: Secondary | ICD-10-CM | POA: Diagnosis not present

## 2024-06-10 DIAGNOSIS — F1120 Opioid dependence, uncomplicated: Secondary | ICD-10-CM | POA: Diagnosis not present

## 2024-06-10 LAB — CBC
HCT: 41.5 % (ref 39.0–52.0)
Hemoglobin: 13.1 g/dL (ref 13.0–17.0)
MCH: 28.1 pg (ref 26.0–34.0)
MCHC: 31.6 g/dL (ref 30.0–36.0)
MCV: 89.1 fL (ref 80.0–100.0)
Platelets: 310 K/uL (ref 150–400)
RBC: 4.66 MIL/uL (ref 4.22–5.81)
RDW: 13.8 % (ref 11.5–15.5)
WBC: 13.6 K/uL — ABNORMAL HIGH (ref 4.0–10.5)
nRBC: 0 % (ref 0.0–0.2)

## 2024-06-10 LAB — BASIC METABOLIC PANEL WITH GFR
Anion gap: 6 (ref 5–15)
BUN: 37 mg/dL — ABNORMAL HIGH (ref 6–20)
CO2: 23 mmol/L (ref 22–32)
Calcium: 8.8 mg/dL — ABNORMAL LOW (ref 8.9–10.3)
Chloride: 103 mmol/L (ref 98–111)
Creatinine, Ser: 1.58 mg/dL — ABNORMAL HIGH (ref 0.61–1.24)
GFR, Estimated: 54 mL/min — ABNORMAL LOW (ref >60)
Glucose, Bld: 117 mg/dL — ABNORMAL HIGH (ref 70–99)
Potassium: 4.2 mmol/L (ref 3.5–5.1)
Sodium: 132 mmol/L — ABNORMAL LOW (ref 135–145)

## 2024-06-10 LAB — GLUCOSE, CAPILLARY
Glucose-Capillary: 127 mg/dL — ABNORMAL HIGH (ref 70–99)
Glucose-Capillary: 184 mg/dL — ABNORMAL HIGH (ref 70–99)
Glucose-Capillary: 150 mg/dL — ABNORMAL HIGH (ref 70–99)
Glucose-Capillary: 200 mg/dL — ABNORMAL HIGH (ref 70–99)

## 2024-06-10 LAB — URINE CULTURE: Culture: NO GROWTH

## 2024-06-10 LAB — HEMOGLOBIN A1C
Hgb A1c MFr Bld: 5.9 % — ABNORMAL HIGH (ref 4.8–5.6)
Mean Plasma Glucose: 123 mg/dL

## 2024-06-10 LAB — LITHIUM LEVEL
Lithium Lvl: 2.26 mmol/L (ref 0.60–1.20)
Lithium Lvl: 2.19 mmol/L (ref 0.60–1.20)
Lithium Lvl: 2.01 mmol/L (ref 0.60–1.20)

## 2024-06-10 LAB — HIV ANTIBODY (ROUTINE TESTING W REFLEX): HIV Screen 4th Generation wRfx: NONREACTIVE

## 2024-06-10 MED ORDER — SODIUM CHLORIDE 0.9 % IV SOLN
1.0000 g | INTRAVENOUS | Status: AC
  Administered 2024-06-10 – 2024-06-11 (×2): 1 g via INTRAVENOUS
  Filled 2024-06-10 (×2): qty 10

## 2024-06-10 MED ORDER — LAMOTRIGINE 100 MG PO TABS
200.0000 mg | ORAL_TABLET | Freq: Every day | ORAL | Status: DC
  Administered 2024-06-10: 200 mg via ORAL
  Filled 2024-06-10: qty 2

## 2024-06-10 NOTE — TOC CM/SW Note (Signed)
 Transition of Care [facility]) - Inpatient Brief Assessment   Patient Details  Name: Angel Pope MRN: [id number] Date of Birth: Feb 26, 1976  Transition of Care [facility]) CM/SW Contact:    Noreen KATHEE Pinal, LCSWA Phone Number: 06/10/2024, 9:53 AM   Clinical Narrative:   Transition of Care Department [facility]) has reviewed patient and no TOC needs have been identified at this time. We will continue to monitor patient advancement through interdisciplinary progression rounds. If new patient transition needs arise, please place a TOC consult.   Transition of Care Asessment: Insurance and Status: Insurance coverage has been reviewed Patient has primary care physician: Yes Home environment has been reviewed: Single Family Home Prior level of function:: Independent Prior/Current Home Services: No current home services Social Drivers of Health Review: SDOH reviewed no interventions necessary Readmission risk has been reviewed: Yes Transition of care needs: no transition of care needs at this time

## 2024-06-10 NOTE — Plan of Care (Signed)
  Problem: Clinical Measurements: Goal: Will remain free from infection Outcome: Progressing   Problem: Activity: Goal: Risk for activity intolerance will decrease Outcome: Progressing   Problem: Coping: Goal: Level of anxiety will decrease Outcome: Progressing   Problem: Pain Managment: Goal: General experience of comfort will improve and/or be controlled Outcome: Progressing   Problem: Safety: Goal: Ability to remain free from injury will improve Outcome: Progressing   Problem: Skin Integrity: Goal: Risk for impaired skin integrity will decrease Outcome: Progressing   Problem: Tissue Perfusion: Goal: Adequacy of tissue perfusion will improve Outcome: Progressing

## 2024-06-10 NOTE — Progress Notes (Signed)
 PROGRESS NOTE  Angel Pope FMW:[id number] DOB: July 04, 1976 DOA: 06/09/2024 PCP: Reita Locus, MD  Brief History:  48 year old male with a history of diabetes mellitus type 2, hypertension, hyperlipidemia, depression/anxiety, OSA, and obesity presenting with tremors, confusion, and difficulty concentrating.  He stated that the symptoms began on the evening of 06/08/2024.  He had some subjective chills but denied any fevers.  He denied any headache, neck pain, focal extremity weakness, chest pain, shortness breath, coughing, hemoptysis, nausea, vomiting or diarrhea, abdominal pain.  There is no dysuria or hematuria.  The patient states that he has chronic nausea with intermittent vomiting.  This is unchanged from his baseline.  In the ED, the patient had a temperature of 100.0 F.  He was hemodynamically stable.  Oxygen saturation was 100% room air.  WBC 13.6, hemoglobin 13.1, platelets 310.  Sodium 132, potassium 4.2, bicarbonate 19, serum creatinine 2.22.  LFTs were unremarkable. Lithium  level was 2.30.  UDS is negative.  UA showed 21-50 WBC.  VBG showed 7.32/58/<31/30.  The patient was started on IV fluids.  Nephrology was consulted to assist with management.   Assessment/Plan: Lithium  toxicity -Presented with confusion, tremors, and renal failure -Initial lithium  level 2.30>> 2.26 -Continue IV fluids -Continue to trend lithium  levels -Appreciate nephrology consult  AKI -Baseline creatinine 0.8-1.0 -Presented with serum creatinine 2.22 -Continue IV fluids  Acute metabolic encephalopathy -Secondary to lithium  toxicity -Overall improved -UA with 21-50 WBC -Continue ceftriaxone  pending culture data -Temporarily holding olanzapine , lamotrigine , gabapentin  -Patient no longer taking quetiapine or hydroxyzine   Essential hypertension -Holding lisinopril secondary to AKI - Holding Nebivolol secondary to soft blood pressure  Diabetes mellitus type 2 with  hyperglycemia -Restart long-acting insulin  at lower dose -NovoLog  sliding scale -Holding metformin -Patient doses of Wegovy on Fridays -Holding Farxiga - Hemoglobin A1c--pending  Opioid dependence - PDMP reviewed - Oxycodone , 15 mg, #120, last refill 05/29/2024 - Fentanyl  TD, 50 mcg, last refill 06/01/2024 - Gabapentin  300 mg, #90, last refill 05/12/2024  Morbid obesity - BMI 76.48 - Lifestyle modification  Anxiety state - Patient is on lithium , lamotrigine  - Holding lithium   OSA - Continue CPAP          Family Communication: no  Family at bedside  Consultants:  renal  Code Status:  FULL   DVT Prophylaxis:  Turner Heparin    Procedures: As Listed in Progress Note Above  Antibiotics: Ceftriaxone  8/6>>      Subjective: Patient states that he is feeling better this morning.  He feels like his concentration is back to near baseline.  His tremors have improved.  He denies any fevers, chills, headache, neck pain, chest pain, shortness breath, nausea, vomiting, diarrhea, abdominal pain.  Objective: Vitals:   06/09/24 2104 06/09/24 2105 06/09/24 2222 06/10/24 0347  BP:  (!) 103/46 134/65 (!) 94/42  Pulse: 77 82 83 73  Resp: 16 (!) 24 19 18   Temp:  99.2 F (37.3 C) 100 F (37.8 C) 98 F (36.7 C)  TempSrc:  Oral Oral Oral  SpO2: 97% 97% 100% 96%  Weight:   (!) 285 kg   Height:   6' 4 (1.93 m)     Intake/Output Summary (Last 24 hours) at 06/10/2024 0817 Last data filed at 06/10/2024 0300 Gross per 24 hour  Intake 650.07 ml  Output --  Net 650.07 ml   Weight change:  Exam:  General:  Pt is alert, follows commands appropriately, not in acute distress HEENT: No icterus, No  thrush, No neck mass, [place]/AT Cardiovascular: RRR, S1/S2, no rubs, no gallops Respiratory: CTA bilaterally, no wheezing, no crackles, no rhonchi Abdomen: Soft/+BS, non tender, non distended, no guarding Extremities: No edema, No lymphangitis, No petechiae, No rashes, no synovitis   Data  Reviewed: I have personally reviewed following labs and imaging studies Basic Metabolic Panel: Recent Labs  Lab 06/09/24 1649 06/10/24 0458  NA 132* 132*  K 4.2 4.2  CL 101 103  CO2 19* 23  GLUCOSE 172* 117*  BUN 39* 37*  CREATININE 2.22* 1.58*  CALCIUM  9.0 8.8*   Liver Function Tests: Recent Labs  Lab 06/09/24 1708  AST 39  ALT 19  ALKPHOS 91  BILITOT 0.9  PROT 7.6  ALBUMIN 3.8   No results for input(s): LIPASE, AMYLASE in the last 168 hours. Recent Labs  Lab 06/09/24 1708  AMMONIA 30   Coagulation Profile: No results for input(s): INR, PROTIME in the last 168 hours. CBC: Recent Labs  Lab 06/09/24 1649 06/10/24 0458  WBC 11.3* 13.6*  NEUTROABS 9.7*  --   HGB 14.1 13.1  HCT 44.5 41.5  MCV 89.5 89.1  PLT 298 310   Cardiac Enzymes: No results for input(s): CKTOTAL, CKMB, CKMBINDEX, TROPONINI in the last 168 hours. BNP: Invalid input(s): POCBNP CBG: Recent Labs  Lab 06/09/24 1626 06/09/24 1952 06/09/24 2227 06/10/24 0727  GLUCAP 167* 106* 125* 127*   HbA1C: No results for input(s): HGBA1C in the last 72 hours. Urine analysis:    Component Value Date/Time   COLORURINE YELLOW 06/09/2024 1657   APPEARANCEUR HAZY (A) 06/09/2024 1657   LABSPEC 1.015 06/09/2024 1657   PHURINE 5.0 06/09/2024 1657   GLUCOSEU >=500 (A) 06/09/2024 1657   HGBUR NEGATIVE 06/09/2024 1657   HGBUR negative 05/15/2010 1552   BILIRUBINUR NEGATIVE 06/09/2024 1657   KETONESUR NEGATIVE 06/09/2024 1657   PROTEINUR 30 (A) 06/09/2024 1657   UROBILINOGEN 0.2 03/27/2013 1304   NITRITE NEGATIVE 06/09/2024 1657   LEUKOCYTESUR TRACE (A) 06/09/2024 1657   Sepsis Labs: @LABRCNTIP (procalcitonin:4,lacticidven:4) )No results found for this or any previous visit (from the past 240 hours).   Scheduled Meds:  atorvastatin   20 mg Oral QHS   clopidogrel   75 mg Oral Daily   heparin   5,000 Units Subcutaneous Q8H   insulin  aspart  0-15 Units Subcutaneous TID WC    insulin  aspart  0-5 Units Subcutaneous QHS   insulin  glargine-yfgn  40 Units Subcutaneous QPM   Continuous Infusions:  sodium chloride  125 mL/hr at 06/10/24 9361    Procedures/Studies: No results found.  Alm Schneider, DO  [facility]  If 7PM-7AM, please contact night-coverage [email or web address] Password [email or web address] 06/10/2024, 8:17 AM   LOS: 1 day

## 2024-06-10 NOTE — Progress Notes (Signed)
 Critical Lab Result: LITHIUM  2.26 Charge nurse and MD notified. No new orders placed

## 2024-06-10 NOTE — Progress Notes (Signed)
 Rec'd call from Poison Control pertaining to Lithium  Toxicity. Following up w/pt neuro status and kidney functioning and repeat CR and Lithium  in am. Pt v/s within range, A/O x 4 no abnormal behaviors or signs for concern. Will continue to monitor pt for adverse reactions. Pt currently asleep w/CPAP in place. Rise and fall of chest visualized.

## 2024-06-10 NOTE — Progress Notes (Signed)
 Pt continues to state he is feeling better since his arrival to the ED yesterday. Requested pen and paper to write a song that was on his mind to test his visual abilities and slight tremors he experienced before coming to the ER. Pt was able to write the lyrics to the song twice. Some mild tremors was noticeable. Pt stated his visual has improved.

## 2024-06-10 NOTE — Progress Notes (Signed)
 Angel Pope is an 48 y.o. male with DM2, obesity, OSA, BPH, chronic pain who presents with confusion. Presenting with tremors and sometimes jerking of his extremities, confusion (for example had a difficult time writing a sentence on a piece of paper).   On numerous medications to help with that including Lamictal , lithium , Zyprexa , nortriptyline , Seroquel, Effexor , gabapentin , oxycodone .  He said that his lithium  dose was increased recently because of anxiety. In the ED found to have an AKI with creatinine of 2.2.  There was concern about lithium  toxicity.  Level returned at 2.3.  Case discussed with poison control who recommended lithium  checks every 6 hours and IV fluids as well as discussion with nephrology regarding possible dialysis.  Assessment/Plan: Lithium  toxicity: Elevated level along with some neurological compromise although symptoms mild.  Given mild symptoms and not severely elevated level we can start with hydration.  Hopefully will not require dialysis as long as kidney function is improving - Lithium  level every 6 hours; they are downtrending. - Continue IVFs w/ NS at 125cc/hr  - Continue to monitor neurological status - Consider dialysis if the patient's kidney function worsens (improving for now), lithium  level increases, more severe neurological compromise; follow overall clinically he is feeling better.    Occurs - Hold potentially neurological impacting medications including nortriptyline , gabapentin , lithium , and other psychiatric meds - Likely can tolerate some of his home opioid medications   AKI: Mild AKI at this time likely related with lithium  toxicity.  Bladder scan with 0 urine after voiding - Management of toxicity as above -Continue to monitor daily Cr, Dose meds for GFR -Monitor Daily I/Os, Daily weight  -Maintain MAP>65 for optimal renal perfusion.  -Avoid nephrotoxic medications including NSAIDs -Use synthetic opioids (Fentanyl /Dilaudid ) if  needed -Currently no indication for HD   Hypertension: Hold home lisinopril     Uncontrolled Diabetes Mellitus Type 2 with Hyperglycemia: Management per primary team   Metabolic acidosis: Mild associated with AKI.  Continue to monitor   Hyponatremia: Mild associated with AKI  Subjective: Feeling much better; denies nausea vomiting shortness of breath.  He feels much more stable and was able to walk in the hallways with any dizziness.  Denies any headaches, nausea, vomiting, shortness of breath.   Chemistry and CBC: Creat  Date/Time Value Ref Range Status  01/11/2011 12:24 PM 0.72 0.40 - 1.50 mg/dL Final   Creatinine, Ser  Date/Time Value Ref Range Status  06/10/2024 04:58 AM 1.58 (H) 0.61 - 1.24 mg/dL Final  91/93/7974 95:50 PM 2.22 (H) 0.61 - 1.24 mg/dL Final  98/91/7975 98:64 AM 0.88 0.61 - 1.24 mg/dL Final  87/83/7976 96:75 PM 1.01 0.61 - 1.24 mg/dL Final  89/96/7977 96:41 PM 1.44 (H) 0.61 - 1.24 mg/dL Final  93/89/7978 96:99 PM 0.91 0.61 - 1.24 mg/dL Final  89/88/7980 95:66 AM 0.67 0.61 - 1.24 mg/dL Final  93/71/7982 87:56 PM 0.79 0.61 - 1.24 mg/dL Final  94/75/7985 89:75 AM 0.65 0.50 - 1.35 mg/dL Final  92/87/7988 94:82 PM 0.8 0.4 - 1.5 mg/dL Final  98/68/7988 96:63 PM 0.7 0.4 - 1.5 mg/dL Final  87/85/7989 89:48 AM 0.71 0.4 - 1.5 mg/dL Final  90/72/7989 89:49 AM 0.8 0.4 - 1.5 mg/dL Final  90/87/7989 95:84 PM 0.79 0.4 - 1.5 mg/dL Final  90/98/7989 94:69 PM 1.06 0.4 - 1.5 mg/dL Final  91/96/7989 94:54 AM 0.79 0.4 - 1.5 mg/dL Final  90/70/7991 90:44 AM 0.8 0.4 - 1.5 mg/dL Final   Recent Labs  Lab 06/09/24 1649 06/10/24 0458  NA 132* 132*  K 4.2 4.2  CL 101 103  CO2 19* 23  GLUCOSE 172* 117*  BUN 39* 37*  CREATININE 2.22* 1.58*  CALCIUM  9.0 8.8*   Recent Labs  Lab 06/09/24 1649 06/10/24 0458  WBC 11.3* 13.6*  NEUTROABS 9.7*  --   HGB 14.1 13.1  HCT 44.5 41.5  MCV 89.5 89.1  PLT 298 310   Liver Function Tests: Recent Labs  Lab 06/09/24 1708  AST 39   ALT 19  ALKPHOS 91  BILITOT 0.9  PROT 7.6  ALBUMIN 3.8   No results for input(s): LIPASE, AMYLASE in the last 168 hours. Recent Labs  Lab 06/09/24 1708  AMMONIA 30   Cardiac Enzymes: No results for input(s): CKTOTAL, CKMB, CKMBINDEX, TROPONINI in the last 168 hours. Iron Studies: No results for input(s): IRON, TIBC, TRANSFERRIN, FERRITIN in the last 72 hours. PT/INR: @LABRCNTIP (inr:5)  Xrays/Other Studies: ) Results for orders placed or performed during the hospital encounter of 06/09/24 (from the past 48 hours)  POC CBG, ED     Status: Abnormal   Collection Time: 06/09/24  4:26 PM  Result Value Ref Range   Glucose-Capillary 167 (H) 70 - 99 mg/dL    Comment: Glucose reference range applies only to samples taken after fasting for at least 8 hours.  CBC with Differential     Status: Abnormal   Collection Time: 06/09/24  4:49 PM  Result Value Ref Range   WBC 11.3 (H) 4.0 - 10.5 K/uL   RBC 4.97 4.22 - 5.81 MIL/uL   Hemoglobin 14.1 13.0 - 17.0 g/dL   HCT 55.4 60.9 - 47.9 %   MCV 89.5 80.0 - 100.0 fL   MCH 28.4 26.0 - 34.0 pg   MCHC 31.7 30.0 - 36.0 g/dL   RDW 86.3 88.4 - 84.4 %   Platelets 298 150 - 400 K/uL   nRBC 0.0 0.0 - 0.2 %   Neutrophils Relative % 86 %   Neutro Abs 9.7 (H) 1.7 - 7.7 K/uL   Lymphocytes Relative 10 %   Lymphs Abs 1.1 0.7 - 4.0 K/uL   Monocytes Relative 4 %   Monocytes Absolute 0.5 0.1 - 1.0 K/uL   Eosinophils Relative 0 %   Eosinophils Absolute 0.0 0.0 - 0.5 K/uL   Basophils Relative 0 %   Basophils Absolute 0.0 0.0 - 0.1 K/uL   Immature Granulocytes 0 %   Abs Immature Granulocytes 0.03 0.00 - 0.07 K/uL    Comment: Performed at [facility], [address], [place], [place] [address]  Basic metabolic panel     Status: Abnormal   Collection Time: 06/09/24  4:49 PM  Result Value Ref Range   Sodium 132 (L) 135 - 145 mmol/L   Potassium 4.2 3.5 - 5.1 mmol/L   Chloride 101 98 - 111 mmol/L   CO2 19 (L) 22 - 32 mmol/L    Glucose, Bld 172 (H) 70 - 99 mg/dL    Comment: Glucose reference range applies only to samples taken after fasting for at least 8 hours.   BUN 39 (H) 6 - 20 mg/dL   Creatinine, Ser 7.77 (H) 0.61 - 1.24 mg/dL   Calcium  9.0 8.9 - 10.3 mg/dL   GFR, Estimated 36 (L) >60 mL/min    Comment: (NOTE) Calculated using the CKD-EPI Creatinine Equation (2021)    Anion gap 12 5 - 15    Comment: Performed at [facility], [address], [place], [place] [address]  Urinalysis, Routine w reflex  microscopic -Urine, Clean Catch     Status: Abnormal   Collection Time: 06/09/24  4:57 PM  Result Value Ref Range   Color, Urine YELLOW YELLOW   APPearance HAZY (A) CLEAR   Specific Gravity, Urine 1.015 1.005 - 1.030   pH 5.0 5.0 - 8.0   Glucose, UA >=500 (A) NEGATIVE mg/dL   Hgb urine dipstick NEGATIVE NEGATIVE   Bilirubin Urine NEGATIVE NEGATIVE   Ketones, ur NEGATIVE NEGATIVE mg/dL   Protein, ur 30 (A) NEGATIVE mg/dL   Nitrite NEGATIVE NEGATIVE   Leukocytes,Ua TRACE (A) NEGATIVE   RBC / HPF 0-5 0 - 5 RBC/hpf   WBC, UA 21-50 0 - 5 WBC/hpf   Bacteria, UA RARE (A) NONE SEEN   Squamous Epithelial / HPF 0-5 0 - 5 /HPF   Mucus PRESENT    Hyaline Casts, UA PRESENT     Comment: Performed at [facility], [address] [place], [place] [address]  Urine rapid drug screen (hosp performed)     Status: None   Collection Time: 06/09/24  4:57 PM  Result Value Ref Range   Opiates NONE DETECTED NONE DETECTED   Cocaine NONE DETECTED NONE DETECTED   Benzodiazepines NONE DETECTED NONE DETECTED   Amphetamines NONE DETECTED NONE DETECTED   Tetrahydrocannabinol NONE DETECTED NONE DETECTED   Barbiturates NONE DETECTED NONE DETECTED    Comment: (NOTE) DRUG SCREEN FOR MEDICAL PURPOSES ONLY.  IF CONFIRMATION IS NEEDED FOR ANY PURPOSE, NOTIFY LAB WITHIN 5 DAYS.  LOWEST DETECTABLE LIMITS FOR URINE DRUG SCREEN Drug Class                     Cutoff (ng/mL) Amphetamine and metabolites    1000 Barbiturate and metabolites     200 Benzodiazepine                 200 Opiates and metabolites        300 Cocaine and metabolites        300 THC                            50 Performed at [facility], [address] [place], [place] [address]   Ammonia     Status: None   Collection Time: 06/09/24  5:08 PM  Result Value Ref Range   Ammonia 30 9 - 35 umol/L    Comment: Performed at [facility], [address], [place], [place] [address]  Ethanol     Status: None   Collection Time: 06/09/24  5:08 PM  Result Value Ref Range   Alcohol, Ethyl (B) <15 <15 mg/dL    Comment: (NOTE) For medical purposes only. Performed at [facility], [address], [place], [place] [address]   Hepatic function panel     Status: None   Collection Time: 06/09/24  5:08 PM  Result Value Ref Range   Total Protein 7.6 6.5 - 8.1 g/dL   Albumin 3.8 3.5 - 5.0 g/dL   AST 39 15 - 41 U/L   ALT 19 0 - 44 U/L   Alkaline Phosphatase 91 38 - 126 U/L   Total Bilirubin 0.9 0.0 - 1.2 mg/dL   Bilirubin, Direct 0.2 0.0 - 0.2 mg/dL   Indirect Bilirubin 0.7 0.3 - 0.9 mg/dL    Comment: Performed at [facility], [address], [place], [place] [address]  Blood gas, venous     Status: Abnormal   Collection Time: 06/09/24  5:13 PM  Result Value Ref Range   pH, Ven 7.32 7.25 - 7.43   pCO2, Ven 58 44 - 60 mmHg   pO2, Ven <31 (LL) 32 - 45 mmHg    Comment: CRITICAL RESULT CALLED TO, READ BACK BY AND VERIFIED WITH: NEWMAN,J ON 06/09/24 AT 1739 BY LOY,C    Bicarbonate 30.1 (H) 20.0 - 28.0 mmol/L   Acid-Base Excess 2.7 (H) 0.0 - 2.0 mmol/L   O2 Saturation 25.3 %   Patient temperature 37.6    Collection site LEFT ANTECUBITAL    Drawn by [phone]     Comment: Performed at [facility], [address], [place], [place] [address]  Lithium  level     Status: Abnormal   Collection Time: 06/09/24  5:13 PM  Result Value Ref Range   Lithium  Lvl 2.30 (HH) 0.60 - 1.20 mmol/L    Comment: CRITICAL RESULT CALLED TO, READ BACK BY AND VERIFIED WITH DOROTHA BAL RN @2014  ON  06/09/24 MAB Performed at [facility], [address], [place], [place] [address]   CBG monitoring, ED     Status: Abnormal   Collection Time: 06/09/24  7:52 PM  Result Value Ref Range   Glucose-Capillary 106 (H) 70 - 99 mg/dL    Comment: Glucose reference range applies only to samples taken after fasting for at least 8 hours.  Glucose, capillary     Status: Abnormal   Collection Time: 06/09/24 10:27 PM  Result Value Ref Range   Glucose-Capillary 125 (H) 70 - 99 mg/dL    Comment: Glucose reference range applies only to samples taken after fasting for at least 8 hours.  Lithium  level     Status: Abnormal   Collection Time: 06/09/24 11:05 PM  Result Value Ref Range   Lithium  Lvl 2.26 (HH) 0.60 - 1.20 mmol/L    Comment: CRITICAL RESULT CALLED TO, READ BACK BY AND VERIFIED WITH E.INGOLD RN [phone] 06/10/2024 BY G.GANADEN Performed at [facility], [address] [place], [place] [address]   Lithium  level     Status: Abnormal   Collection Time: 06/10/24  4:58 AM  Result Value Ref Range   Lithium  Lvl 2.19 (HH) 0.60 - 1.20 mmol/L    Comment: CRITICAL RESULT CALLED TO, READ BACK BY AND VERIFIED WITH JADA MUSE RN @0826  08.07.2025 E.AHMED Performed at [facility], [address] [place], [place] [address]   Basic metabolic panel with GFR     Status: Abnormal   Collection Time: 06/10/24  4:58 AM  Result Value Ref Range   Sodium 132 (L) 135 - 145 mmol/L   Potassium 4.2 3.5 - 5.1 mmol/L   Chloride 103 98 - 111 mmol/L   CO2 23 22 - 32 mmol/L   Glucose, Bld 117 (H) 70 - 99 mg/dL    Comment: Glucose reference range applies only to samples taken after fasting for at least 8 hours.   BUN 37 (H) 6 - 20 mg/dL   Creatinine, Ser 8.41 (H) 0.61 - 1.24 mg/dL   Calcium  8.8 (L) 8.9 - 10.3 mg/dL   GFR, Estimated 54 (L) >60 mL/min    Comment: (NOTE) Calculated using the CKD-EPI Creatinine Equation (2021)    Anion gap 6 5 - 15    Comment: Performed at [facility], [address] [place], [place] [address]  CBC     Status: Abnormal   Collection Time: 06/10/24  4:58 AM  Result Value Ref Range   WBC 13.6 (H)  4.0 - 10.5 K/uL   RBC 4.66 4.22 - 5.81 MIL/uL   Hemoglobin 13.1 13.0 - 17.0 g/dL   HCT 58.4 60.9 - 47.9 %   MCV 89.1 80.0 - 100.0 fL   MCH 28.1 26.0 - 34.0 pg   MCHC 31.6 30.0 - 36.0 g/dL   RDW 86.1 88.4 - 84.4 %   Platelets 310 150 - 400 K/uL   nRBC 0.0 0.0 - 0.2 %    Comment: Performed at [facility], [address] [place], [place] [address]  Glucose, capillary     Status: Abnormal   Collection Time: 06/10/24  7:27 AM  Result Value Ref Range   Glucose-Capillary 127 (H) 70 - 99 mg/dL    Comment: Glucose reference range applies only to samples taken after fasting for at least 8 hours.   Comment 1 Notify RN    Comment 2 Document in Chart    No results found.  PMH:   Past Medical History:  Diagnosis Date   Anal fissure    Anxiety    BPH (benign prostatic hyperplasia)    Depression    sees Dr. Elna Lo    Diabetes mellitus without complication (HCC)    Fever of unknown origin    seeing Dr. Chyrl Fenton    Hypertension    Morbid obesity (HCC)    Nausea    chronic- Dr Avram   Overactive bladder    Overactive bladder    Pain, low back    Plantar fasciitis    Sleep apnea    uses cpap   Ureterolithiasis     PSH:   Past Surgical History:  Procedure Laterality Date   ANAL SPHINCTEROTOMY     dr cornett 06/04/09   APPENDECTOMY     CERVICAL FUSION  2017   ESOPHAGOGASTRODUODENOSCOPY     dr avram - normal   lab band  06/06/2008   dr mikell    Allergies:  Allergies  Allergen Reactions   Acetaminophen  Hives, Itching and Nausea And Vomiting   Aspartame Hives, Nausea And Vomiting and Nausea Only    Headache   Ciprofloxacin Hives and Dermatitis   Sulfa Antibiotics Hives and Nausea Only   Etodolac Other (See Comments)    Caused legs to ache   Metronidazole Nausea And Vomiting   Phenylalanine Hives and Nausea And Vomiting     Headache   Sulfamethoxazole-Trimethoprim Itching    Medications:   Prior to Admission medications   Medication Sig Start Date End Date Taking? Authorizing Provider  atorvastatin  (LIPITOR) 20 MG tablet Take 1 tablet by mouth daily. 06/14/19  Yes [provider]  betamethasone dipropionate 0.05 % cream Apply 1 application topically daily as needed (itching).  02/24/20  Yes [provider]  chlorhexidine (PERIDEX) 0.12 % solution Use as directed 15 mLs in the mouth or throat daily.  03/28/20  Yes [provider]  clopidogrel  (PLAVIX ) 75 MG tablet Take 1 tablet by mouth daily. 06/23/23  Yes [provider]  clotrimazole-betamethasone (LOTRISONE) cream Apply 1 application topically daily as needed (itching).  02/23/20  Yes [provider]  ESZOPICLONE 3 MG tablet Take 3 mg by mouth at bedtime as needed (sleep). 03/23/20  Yes [provider]  famotidine  (PEPCID ) 20 MG tablet Take 1 tablet (20 mg total) by mouth 2 (two) times daily. Patient taking differently: Take 20 mg by mouth 2 (two) times daily as needed for heartburn or indigestion. 04/13/20  Yes Desiderio Chew, PA-C  FARXIGA 10 MG TABS  tablet Take 10 mg by mouth every evening.  08/09/18  Yes [provider]  fentaNYL  (DURAGESIC ) 50 MCG/HR Place 1 patch onto the skin every 3 (three) days. 02/18/22 07/01/24 Yes [provider]  gabapentin  (NEURONTIN ) 300 MG capsule Take 600 mg by mouth 3 (three) times daily. 10/06/17  Yes [provider]  hydrOXYzine  (ATARAX /VISTARIL ) 10 MG tablet Take 10 mg by mouth 3 (three) times daily. 10/01/17  Yes [provider]  hydrOXYzine  (ATARAX /VISTARIL ) 50 MG tablet Take 50 mg by mouth at bedtime as needed for anxiety.  03/23/20  Yes [provider]  ibuprofen  (ADVIL ) 200 MG tablet Take 600 mg by mouth every 6 (six) hours as needed for mild pain (pain score 1-3).   Yes [provider]  insulin  aspart protamine- aspart  (NOVOLOG  MIX 70/30) (70-30) 100 UNIT/ML injection Inject 16 Units into the skin in the morning, at noon, and at bedtime. Sliding Scale   Yes [provider]  LAMICTAL  200 MG tablet Take 200 mg by mouth at bedtime.  03/23/20  Yes [provider]  lisinopril (ZESTRIL) 10 MG tablet Take 10 mg by mouth daily. 06/14/19  Yes [provider]  lithium  carbonate (LITHOBID) 300 MG CR tablet Take 300 mg by mouth 2 (two) times daily. 03/20/20  Yes [provider]  metFORMIN (GLUCOPHAGE-XR) 500 MG 24 hr tablet Take 500 mg by mouth 2 (two) times daily. 03/27/20  Yes [provider]  nebivolol (BYSTOLIC) 2.5 MG tablet Take 2.5 mg by mouth daily.   Yes [provider]  OLANZapine  (ZYPREXA ) 10 MG tablet Take 10 mg by mouth at bedtime as needed (sleep).   Yes [provider]  omeprazole  (PRILOSEC) 20 MG capsule Take 20 mg by mouth 2 (two) times daily as needed (heartburn, indigestion). 03/26/20  Yes [provider]  ondansetron  (ZOFRAN ) 4 MG tablet Take 4 mg by mouth every 8 (eight) hours as needed for vomiting. 03/23/20  Yes [provider]  oxyCODONE  (ROXICODONE ) 15 MG immediate release tablet Take 15 mg by mouth every 6 (six) hours as needed for pain.   Yes [provider]  OZEMPIC, 2 MG/DOSE, 8 MG/3ML SOPN Inject 2 mg into the skin once a week. 03/25/24  Yes [provider]  pantoprazole  (PROTONIX ) 20 MG tablet Take 20 mg by mouth daily as needed for heartburn or indigestion. 02/09/24  Yes [provider]  QUEtiapine (SEROQUEL) 400 MG tablet Take 600 mg by mouth at bedtime as needed (sleep). 03/23/20  Yes [provider]  testosterone cypionate (DEPOTESTOSTERONE CYPIONATE) 200 MG/ML injection Inject 0.5 mLs into the muscle every 14 (fourteen) days. Wednesday 04/27/18 06/09/24 Yes [provider]  TRESIBA  FLEXTOUCH 200 UNIT/ML SOPN Inject 100 Units into the skin every evening.  10/03/17  Yes [provider]  WEGOVY 2.4 MG/0.75ML SOAJ SQ injection Inject 0.75 mLs into the skin every Friday. 05/10/24  Yes [provider]  Continuous Glucose Sensor (DEXCOM G6 SENSOR) MISC DISPENSE AND USE AS DIRECTED 07/06/19   [provider]    Discontinued Meds:   Medications Discontinued During This Encounter  Medication Reason   OZEMPIC, 1 MG/DOSE, 2 MG/1.5ML SOPN Dose change   fentaNYL  (DURAGESIC  - DOSED MCG/HR) 25 MCG/HR patch Dose change   Oxycodone  HCl 10 MG TABS Dose change   XYOSTED 75 MG/0.5ML SOAJ Change in therapy   lamoTRIgine  (LAMICTAL ) 150 MG tablet Dose change   venlafaxine  XR (EFFEXOR -XR) 75 MG 24 hr capsule Patient Preference   traZODone (  DESYREL) 50 MG tablet Patient Preference   nortriptyline  (PAMELOR ) 25 MG capsule Patient Preference   DULoxetine (CYMBALTA) 20 MG capsule Patient Preference   MYRBETRIQ 25 MG TB24 tablet Discontinued by provider   tiZANidine  (ZANAFLEX ) 4 MG tablet Change in therapy   metaxalone  (SKELAXIN ) 800 MG tablet Patient Preference   nystatin (MYCOSTATIN/NYSTOP) powder Patient Preference   lidocaine  (XYLOCAINE ) 5 % ointment Patient Preference   lidocaine  (XYLOCAINE ) 5 % ointment Patient Preference   0.9 %  sodium chloride  infusion     Social History:  reports that he has never smoked. He has never used smokeless tobacco. He reports that he does not drink alcohol and does not use drugs.  Family History:   Family History  Problem Relation Age of Onset   Cancer Father        colon   Depression Other    Hypertension Other    Diabetes Other    COPD Other        adrenal tumors   Cystic fibrosis Other        paternall cousins   Inflammatory bowel disease Other        paternall aunt    Blood pressure (!) 94/42, pulse 73, temperature 98 F (36.7 C), temperature source Oral, resp. rate 18, height 6' 4 (1.93 m), weight (!) 285 kg, SpO2 96%. Physical Exam: General: well-appearing, NAD HEENT: NCAT, anicteric sclera CV: RRR Lungs:  CTA b/l Abd: SNDNT+BS Skin: no visible lesions or rashes Psych: alert, engaged, appropriate mood and affect Musculoskeletal: no obvious deformities Neuro: normal speech, no gross focal deficits, intermittent tremors of the hands and legs       Ketura Sirek, LYNWOOD ORN, MD 06/10/2024, 9:38 AM

## 2024-06-10 NOTE — Progress Notes (Signed)
   06/10/24 2007  BiPAP/CPAP/SIPAP  BiPAP/CPAP/SIPAP Pt Type Adult  BiPAP/CPAP/SIPAP DREAMSTATIOND  Mask Type Full face mask  Dentures removed? Not applicable  Mask Size Large  EPAP 12 cmH2O  FiO2 (%) 21 %  Patient Home Machine No  Patient Home Mask No  Patient Home Tubing No  Auto Titrate No  Device Plugged into RED Power Outlet Yes   Patient using hospital CPAP independently. RT made sure equipment was plugged in the red outlet, set on appropriate setting and ready for use. Patient told to call if he needed any help later tonight or assistance.

## 2024-06-10 NOTE — Hospital Course (Addendum)
 48 year old male with a history of diabetes mellitus type 2, hypertension, hyperlipidemia, depression/anxiety, OSA, and obesity presenting with tremors, confusion, and difficulty concentrating.  He stated that the symptoms began on the evening of 06/08/2024.  He had some subjective chills but denied any fevers.  He denied any headache, neck pain, focal extremity weakness, chest pain, shortness breath, coughing, hemoptysis, nausea, vomiting or diarrhea, abdominal pain.  There is no dysuria or hematuria.  The patient states that he has chronic nausea with intermittent vomiting.  This is unchanged from his baseline.  In the ED, the patient had a temperature of 100.0 F.  He was hemodynamically stable.  Oxygen saturation was 100% room air.  WBC 13.6, hemoglobin 13.1, platelets 310.  Sodium 132, potassium 4.2, bicarbonate 19, serum creatinine 2.22.  LFTs were unremarkable. Lithium  level was 2.30.  UDS is negative.  UA showed 21-50 WBC.  VBG showed 7.32/58/<31/30.  The patient was started on IV fluids.  Nephrology was consulted to assist with management. They recommended continue IV fluids and conservative care as the patient was slowly improving clinically.  With IV fluids, the patient's renal function gradually improved back to baseline.  His lithium  level gradually decreased and returned to therapeutic range..  The patient's mental status returned back to baseline.  He was monitored on telemetry without concerning dysrhythmias.

## 2024-06-11 DIAGNOSIS — T56891D Toxic effect of other metals, accidental (unintentional), subsequent encounter: Secondary | ICD-10-CM

## 2024-06-11 DIAGNOSIS — N179 Acute kidney failure, unspecified: Secondary | ICD-10-CM | POA: Diagnosis not present

## 2024-06-11 DIAGNOSIS — F1120 Opioid dependence, uncomplicated: Secondary | ICD-10-CM | POA: Diagnosis not present

## 2024-06-11 DIAGNOSIS — F411 Generalized anxiety disorder: Secondary | ICD-10-CM | POA: Diagnosis not present

## 2024-06-11 LAB — LITHIUM LEVEL
Lithium Lvl: 2.05 mmol/L (ref 0.60–1.20)
Lithium Lvl: 1.93 mmol/L (ref 0.60–1.20)
Lithium Lvl: 1.95 mmol/L (ref 0.60–1.20)
Lithium Lvl: 1.75 mmol/L (ref 0.60–1.20)
Lithium Lvl: 1.58 mmol/L (ref 0.60–1.20)

## 2024-06-11 LAB — GLUCOSE, CAPILLARY
Glucose-Capillary: 128 mg/dL — ABNORMAL HIGH (ref 70–99)
Glucose-Capillary: 160 mg/dL — ABNORMAL HIGH (ref 70–99)
Glucose-Capillary: 169 mg/dL — ABNORMAL HIGH (ref 70–99)
Glucose-Capillary: 174 mg/dL — ABNORMAL HIGH (ref 70–99)

## 2024-06-11 LAB — T4, FREE: Free T4: 0.84 ng/dL (ref 0.61–1.12)

## 2024-06-11 LAB — BASIC METABOLIC PANEL WITH GFR
Anion gap: 6 (ref 5–15)
BUN: 26 mg/dL — ABNORMAL HIGH (ref 6–20)
CO2: 24 mmol/L (ref 22–32)
Calcium: 9 mg/dL (ref 8.9–10.3)
Chloride: 105 mmol/L (ref 98–111)
Creatinine, Ser: 1.11 mg/dL (ref 0.61–1.24)
GFR, Estimated: >60 mL/min (ref >60)
Glucose, Bld: 118 mg/dL — ABNORMAL HIGH (ref 70–99)
Potassium: 4.5 mmol/L (ref 3.5–5.1)
Sodium: 135 mmol/L (ref 135–145)

## 2024-06-11 LAB — TSH: TSH: 1.285 u[IU]/mL (ref 0.350–4.500)

## 2024-06-11 LAB — CBC
HCT: 40 % (ref 39.0–52.0)
Hemoglobin: 12.8 g/dL — ABNORMAL LOW (ref 13.0–17.0)
MCH: 29 pg (ref 26.0–34.0)
MCHC: 32 g/dL (ref 30.0–36.0)
MCV: 90.7 fL (ref 80.0–100.0)
Platelets: 229 K/uL (ref 150–400)
RBC: 4.41 MIL/uL (ref 4.22–5.81)
RDW: 13.6 % (ref 11.5–15.5)
WBC: 9.4 K/uL (ref 4.0–10.5)
nRBC: 0 % (ref 0.0–0.2)

## 2024-06-11 LAB — MAGNESIUM: Magnesium: 2.8 mg/dL — ABNORMAL HIGH (ref 1.7–2.4)

## 2024-06-11 LAB — AMMONIA: Ammonia: 23 umol/L (ref 9–35)

## 2024-06-11 LAB — FOLATE: Folate: 8.6 ng/mL (ref >5.9)

## 2024-06-11 LAB — VITAMIN B12: Vitamin B-12: 132 pg/mL — ABNORMAL LOW (ref 180–914)

## 2024-06-11 MED ORDER — SODIUM CHLORIDE 0.9 % IV SOLN: INTRAVENOUS | Status: AC

## 2024-06-11 MED ORDER — OLANZAPINE 5 MG PO TABS
10.0000 mg | ORAL_TABLET | Freq: Every day | ORAL | Status: DC
  Administered 2024-06-11 – 2024-06-12 (×2): 10 mg via ORAL
  Filled 2024-06-11 (×2): qty 2

## 2024-06-11 MED ORDER — LAMOTRIGINE 25 MG PO TABS
150.0000 mg | ORAL_TABLET | Freq: Two times a day (BID) | ORAL | Status: DC
  Administered 2024-06-11 – 2024-06-13 (×4): 150 mg via ORAL
  Filled 2024-06-11 (×4): qty 2

## 2024-06-11 NOTE — Progress Notes (Signed)
 PROGRESS NOTE  Angel Pope FMW:[id number] DOB: 1976-11-03 DOA: 06/09/2024 PCP: Reita Locus, MD  Brief History:  48 year old male with a history of diabetes mellitus type 2, hypertension, hyperlipidemia, depression/anxiety, OSA, and obesity presenting with tremors, confusion, and difficulty concentrating.  He stated that the symptoms began on the evening of 06/08/2024.  He had some subjective chills but denied any fevers.  He denied any headache, neck pain, focal extremity weakness, chest pain, shortness breath, coughing, hemoptysis, nausea, vomiting or diarrhea, abdominal pain.  There is no dysuria or hematuria.  The patient states that he has chronic nausea with intermittent vomiting.  This is unchanged from his baseline.  In the ED, the patient had a temperature of 100.0 F.  He was hemodynamically stable.  Oxygen saturation was 100% room air.  WBC 13.6, hemoglobin 13.1, platelets 310.  Sodium 132, potassium 4.2, bicarbonate 19, serum creatinine 2.22.  LFTs were unremarkable. Lithium  level was 2.30.  UDS is negative.  UA showed 21-50 WBC.  VBG showed 7.32/58/<31/30.  The patient was started on IV fluids.  Nephrology was consulted to assist with management.   Assessment/Plan: Lithium  toxicity -Presented with confusion, tremors, and renal failure -Initial lithium  level 2.30>> 2.26>>2.19>>2.01>>2.05>>1.93>>1.95 -Continue IV fluids -Continue to trend lithium  levels -Appreciate nephrology consult -discussed with patient's psychiatrist, Dr. Ford not restart lithium  at time of d/c   AKI -Baseline creatinine 0.8-1.0 -Presented with serum creatinine 2.22 -Continue IV fluids>>improving  Acute metabolic encephalopathy -Secondary to lithium  toxicity -Overall improved -UA with 21-50 WBC -urine culture neg -Continue ceftriaxone --give 3 days -Temporarily holding lamotrigine , gabapentin  -Patient no longer taking quetiapine or hydroxyzine  -check B12 -chech TSH -Folate    Essential hypertension -Holding lisinopril secondary to AKI - Holding Nebivolol secondary to soft blood pressure   Diabetes mellitus type 2 with hyperglycemia -Restart long-acting insulin  at lower dose -NovoLog  sliding scale -Holding metformin -Patient doses of Wegovy on Fridays -Holding Farxiga - 8/6 Hemoglobin A1c--5.9   Opioid dependence - PDMP reviewed - Oxycodone , 15 mg, #120, last refill 05/29/2024 - Fentanyl  TD, 50 mcg, last refill 06/01/2024 - Gabapentin  300 mg, #90, last refill 05/12/2024   Morbid obesity - BMI 76.48 - Lifestyle modification   Anxiety state - Patient is on lithium , lamotrigine  - discussed with pt's psychiatrist>>pt is on lamotrigine  150 mg bid - Holding lithium    OSA - Continue CPAP                   Family Communication: no  Family at bedside   Consultants:  renal   Code Status:  FULL    DVT Prophylaxis:  [place] Heparin      Procedures: As Listed in Progress Note Above   Antibiotics: Ceftriaxone  8/6>>           Family Communication:   no Family at bedside  Consultants:  renal  Code Status:  FULL   DVT Prophylaxis:  [place] Heparin     Procedures: As Listed in Progress Note Above  Antibiotics: None      Subjective: Feeling a little anxious.  Denies f/c, cp, sob, n/v/d, abd pain  Objective: Vitals:   06/10/24 1315 06/10/24 1944 06/11/24 0329 06/11/24 1316  BP: (!) 143/64 118/69 (!) 103/48 134/70  Pulse: 77 74 67 82  Resp: 15 16 16    Temp: 98.1 F (36.7 C) 98.6 F (37 C) 98.4 F (36.9 C) 98.9 F (37.2 C)  TempSrc: Oral Oral Axillary Oral  SpO2: 99% 97% 96% 100%  Weight:      Height:        Intake/Output Summary (Last 24 hours) at 06/11/2024 1418 Last data filed at 06/11/2024 1300 Gross per 24 hour  Intake 2610.29 ml  Output 2100 ml  Net 510.29 ml   Weight change:  Exam:  General:  Pt is alert, follows commands appropriately, not in acute distress HEENT: No icterus, No thrush, No neck mass,  [place]/AT Cardiovascular: RRR, S1/S2, no rubs, no gallops Respiratory: CTA bilaterally, no wheezing, no crackles, no rhonchi Abdomen: Soft/+BS, non tender, non distended, no guarding Extremities: No edema, No lymphangitis, No petechiae, No rashes, no synovitis   Data Reviewed: I have personally reviewed following labs and imaging studies Basic Metabolic Panel: Recent Labs  Lab 06/09/24 1649 06/10/24 0458 06/11/24 0516  NA 132* 132* 135  K 4.2 4.2 4.5  CL 101 103 105  CO2 19* 23 24  GLUCOSE 172* 117* 118*  BUN 39* 37* 26*  CREATININE 2.22* 1.58* 1.11  CALCIUM  9.0 8.8* 9.0  MG  --   --  2.8*   Liver Function Tests: Recent Labs  Lab 06/09/24 1708  AST 39  ALT 19  ALKPHOS 91  BILITOT 0.9  PROT 7.6  ALBUMIN 3.8   No results for input(s): LIPASE, AMYLASE in the last 168 hours. Recent Labs  Lab 06/09/24 1708  AMMONIA 30   Coagulation Profile: No results for input(s): INR, PROTIME in the last 168 hours. CBC: Recent Labs  Lab 06/09/24 1649 06/10/24 0458 06/11/24 0516  WBC 11.3* 13.6* 9.4  NEUTROABS 9.7*  --   --   HGB 14.1 13.1 12.8*  HCT 44.5 41.5 40.0  MCV 89.5 89.1 90.7  PLT 298 310 229   Cardiac Enzymes: No results for input(s): CKTOTAL, CKMB, CKMBINDEX, TROPONINI in the last 168 hours. BNP: Invalid input(s): POCBNP CBG: Recent Labs  Lab 06/10/24 1114 06/10/24 1631 06/10/24 1943 06/11/24 0726 06/11/24 1104  GLUCAP 184* 150* 200* 128* 160*   HbA1C: Recent Labs    06/09/24 1649  HGBA1C 5.9*   Urine analysis:    Component Value Date/Time   COLORURINE YELLOW 06/09/2024 1657   APPEARANCEUR HAZY (A) 06/09/2024 1657   LABSPEC 1.015 06/09/2024 1657   PHURINE 5.0 06/09/2024 1657   GLUCOSEU >=500 (A) 06/09/2024 1657   HGBUR NEGATIVE 06/09/2024 1657   HGBUR negative 05/15/2010 1552   BILIRUBINUR NEGATIVE 06/09/2024 1657   KETONESUR NEGATIVE 06/09/2024 1657   PROTEINUR 30 (A) 06/09/2024 1657   UROBILINOGEN 0.2 03/27/2013 1304    NITRITE NEGATIVE 06/09/2024 1657   LEUKOCYTESUR TRACE (A) 06/09/2024 1657   Sepsis Labs: @LABRCNTIP (procalcitonin:4,lacticidven:4) ) Recent Results (from the past 240 hours)  Urine Culture     Status: None   Collection Time: 06/09/24  6:41 PM   Specimen: Urine, Clean Catch  Result Value Ref Range Status   Specimen Description   Final    URINE, CLEAN CATCH Performed at [facility], [address], [place], [place] [address]    Special Requests   Final    NONE Performed at [facility], [address], [place], [place] [address]    Culture   Final    NO GROWTH Performed at [facility], [address] [place], [place] [address]    Report Status 06/10/2024 FINAL  Final     Scheduled Meds:  atorvastatin   20 mg Oral QHS   clopidogrel   75 mg Oral Daily   heparin   5,000 Units Subcutaneous Q8H   insulin  aspart  0-15 Units Subcutaneous TID WC   insulin  aspart  0-5 Units Subcutaneous QHS   insulin  glargine-yfgn  40 Units Subcutaneous QPM   lamoTRIgine   150 mg Oral BID   OLANZapine   10 mg Oral QHS   Continuous Infusions:  sodium chloride  125 mL/hr at 06/11/24 1231   cefTRIAXone  (ROCEPHIN )  IV 1 g (06/10/24 1818)    Procedures/Studies: No results found.  Alm Schneider, DO  [facility]  If 7PM-7AM, please contact night-coverage [email or web address] Password [email or web address] 06/11/2024, 2:18 PM   LOS: 2 days

## 2024-06-11 NOTE — Progress Notes (Signed)
 Angel Pope is an 48 y.o. male with DM2, obesity, OSA, BPH, chronic pain who presents with confusion. Presenting with tremors and sometimes jerking of his extremities, confusion (for example had a difficult time writing a sentence on a piece of paper).   On numerous medications to help with that including Lamictal , lithium , Zyprexa , nortriptyline , Seroquel, Effexor , gabapentin , oxycodone .  He said that his lithium  dose was increased recently because of anxiety. In the ED found to have an AKI with creatinine of 2.2.  There was concern about lithium  toxicity.  Level returned at 2.3.  Case discussed with poison control who recommended lithium  checks every 6 hours and IV fluids as well as discussion with nephrology regarding possible dialysis.  Assessment/Plan: Lithium  toxicity: Elevated level along with some neurological compromise although symptoms mild.  Given mild symptoms and not severely elevated level we can start with hydration.  Hopefully will not require dialysis as long as kidney function is improving - Lithium  level downtrending though slowly - if the one pending from the AM cont to decline I think we can switch from q6h to q24h checks given kidney function normal now and should continue to improve - Continue IVFs w/ NS at 125cc/hr given level still quite elevated on last check - Continue to monitor neurological status - No current indications for dialysis -Would d/w poison control at what point ok for d/c, would think likely in the next 24h if level trending down and kidney function now normal if he can stay hydrated   AKI: Mild AKI likely related with lithium  toxicity and kidney function has normalized with normal UOP.  Urine bland, bladder scan was unrevealing   Hypertension: Holding home lisinopril in setting of AKI - with GFR normal now would be fine to resume but BP is 103/48 this AM no no need    Diabetes Mellitus Type 2 with Hyperglycemia: A1c 5.9, Management per primary team    Hyponatremia: Mild associated with AKI -> now resolved  Expect his lithium  level will continue to improve.  Nothing further to add, will sign off.  No need for nephrology outpt f/u. Will defer decision regarding ongoing use of lithium  to his psychiatrist who can weigh the risk and benefit. There is no firm contraindication to future use with close monitoring, but after this would certain be a bit more concerned.   We discussed this - he is hopeful he will not need lithium  going forward - this episode spooked him which is understandable.  Subjective: Feeling improved but not to baseline - says cognition feels slower than baseline.  Denies any headaches, nausea, vomiting, shortness of breath.   Chemistry and CBC: Creat  Date/Time Value Ref Range Status  01/11/2011 12:24 PM 0.72 0.40 - 1.50 mg/dL Final   Creatinine, Ser  Date/Time Value Ref Range Status  06/11/2024 05:16 AM 1.11 0.61 - 1.24 mg/dL Final  91/92/7974 95:41 AM 1.58 (H) 0.61 - 1.24 mg/dL Final  91/93/7974 95:50 PM 2.22 (H) 0.61 - 1.24 mg/dL Final  98/91/7975 98:64 AM 0.88 0.61 - 1.24 mg/dL Final  87/83/7976 96:75 PM 1.01 0.61 - 1.24 mg/dL Final  89/96/7977 96:41 PM 1.44 (H) 0.61 - 1.24 mg/dL Final  93/89/7978 96:99 PM 0.91 0.61 - 1.24 mg/dL Final  89/88/7980 95:66 AM 0.67 0.61 - 1.24 mg/dL Final  93/71/7982 87:56 PM 0.79 0.61 - 1.24 mg/dL Final  94/75/7985 89:75 AM 0.65 0.50 - 1.35 mg/dL Final  92/87/7988 94:82 PM 0.8 0.4 - 1.5 mg/dL Final  98/68/7988 96:63 PM 0.7  0.4 - 1.5 mg/dL Final  87/85/7989 89:48 AM 0.71 0.4 - 1.5 mg/dL Final  90/72/7989 89:49 AM 0.8 0.4 - 1.5 mg/dL Final  90/87/7989 95:84 PM 0.79 0.4 - 1.5 mg/dL Final  90/98/7989 94:69 PM 1.06 0.4 - 1.5 mg/dL Final  91/96/7989 94:54 AM 0.79 0.4 - 1.5 mg/dL Final  90/70/7991 90:44 AM 0.8 0.4 - 1.5 mg/dL Final   Recent Labs  Lab 06/09/24 1649 06/10/24 0458 06/11/24 0516  NA 132* 132* 135  K 4.2 4.2 4.5  CL 101 103 105  CO2 19* 23 24  GLUCOSE 172* 117* 118*   BUN 39* 37* 26*  CREATININE 2.22* 1.58* 1.11  CALCIUM  9.0 8.8* 9.0   Recent Labs  Lab 06/09/24 1649 06/10/24 0458 06/11/24 0516  WBC 11.3* 13.6* 9.4  NEUTROABS 9.7*  --   --   HGB 14.1 13.1 12.8*  HCT 44.5 41.5 40.0  MCV 89.5 89.1 90.7  PLT 298 310 229   Liver Function Tests: Recent Labs  Lab 06/09/24 1708  AST 39  ALT 19  ALKPHOS 91  BILITOT 0.9  PROT 7.6  ALBUMIN 3.8   No results for input(s): LIPASE, AMYLASE in the last 168 hours. Recent Labs  Lab 06/09/24 1708  AMMONIA 30   Cardiac Enzymes: No results for input(s): CKTOTAL, CKMB, CKMBINDEX, TROPONINI in the last 168 hours. Iron Studies: No results for input(s): IRON, TIBC, TRANSFERRIN, FERRITIN in the last 72 hours. PT/INR: @LABRCNTIP (inr:5)  Xrays/Other Studies: ) Results for orders placed or performed during the hospital encounter of 06/09/24 (from the past 48 hours)  POC CBG, ED     Status: Abnormal   Collection Time: 06/09/24  4:26 PM  Result Value Ref Range   Glucose-Capillary 167 (H) 70 - 99 mg/dL    Comment: Glucose reference range applies only to samples taken after fasting for at least 8 hours.  CBC with Differential     Status: Abnormal   Collection Time: 06/09/24  4:49 PM  Result Value Ref Range   WBC 11.3 (H) 4.0 - 10.5 K/uL   RBC 4.97 4.22 - 5.81 MIL/uL   Hemoglobin 14.1 13.0 - 17.0 g/dL   HCT 55.4 60.9 - 47.9 %   MCV 89.5 80.0 - 100.0 fL   MCH 28.4 26.0 - 34.0 pg   MCHC 31.7 30.0 - 36.0 g/dL   RDW 86.3 88.4 - 84.4 %   Platelets 298 150 - 400 K/uL   nRBC 0.0 0.0 - 0.2 %   Neutrophils Relative % 86 %   Neutro Abs 9.7 (H) 1.7 - 7.7 K/uL   Lymphocytes Relative 10 %   Lymphs Abs 1.1 0.7 - 4.0 K/uL   Monocytes Relative 4 %   Monocytes Absolute 0.5 0.1 - 1.0 K/uL   Eosinophils Relative 0 %   Eosinophils Absolute 0.0 0.0 - 0.5 K/uL   Basophils Relative 0 %   Basophils Absolute 0.0 0.0 - 0.1 K/uL   Immature Granulocytes 0 %   Abs Immature Granulocytes 0.03 0.00 -  0.07 K/uL    Comment: Performed at [facility], [address], [place], [place] [address]  Basic metabolic panel     Status: Abnormal   Collection Time: 06/09/24  4:49 PM  Result Value Ref Range   Sodium 132 (L) 135 - 145 mmol/L   Potassium 4.2 3.5 - 5.1 mmol/L   Chloride 101 98 - 111 mmol/L   CO2 19 (L) 22 - 32 mmol/L   Glucose, Bld 172 (H) 70 - 99  mg/dL    Comment: Glucose reference range applies only to samples taken after fasting for at least 8 hours.   BUN 39 (H) 6 - 20 mg/dL   Creatinine, Ser 7.77 (H) 0.61 - 1.24 mg/dL   Calcium  9.0 8.9 - 10.3 mg/dL   GFR, Estimated 36 (L) >60 mL/min    Comment: (NOTE) Calculated using the CKD-EPI Creatinine Equation (2021)    Anion gap 12 5 - 15    Comment: Performed at [facility], [address], [place], [place] [address]  Hemoglobin A1c     Status: Abnormal   Collection Time: 06/09/24  4:49 PM  Result Value Ref Range   Hgb A1c MFr Bld 5.9 (H) 4.8 - 5.6 %    Comment: (NOTE)         Prediabetes: 5.7 - 6.4         Diabetes: >6.4         Glycemic control for adults with diabetes: <7.0    Mean Plasma Glucose 123 mg/dL    Comment: (NOTE) Performed At: [facility] [address] [place], [place] [id number] Jennette Shorter MD Ey:[phone]   Urinalysis, Routine w reflex microscopic -Urine, Clean Catch     Status: Abnormal   Collection Time: 06/09/24  4:57 PM  Result Value Ref Range   Color, Urine YELLOW YELLOW   APPearance HAZY (A) CLEAR   Specific Gravity, Urine 1.015 1.005 - 1.030   pH 5.0 5.0 - 8.0   Glucose, UA >=500 (A) NEGATIVE mg/dL   Hgb urine dipstick NEGATIVE NEGATIVE   Bilirubin Urine NEGATIVE NEGATIVE   Ketones, ur NEGATIVE NEGATIVE mg/dL   Protein, ur 30 (A) NEGATIVE mg/dL   Nitrite NEGATIVE NEGATIVE   Leukocytes,Ua TRACE (A) NEGATIVE   RBC / HPF 0-5 0 - 5 RBC/hpf   WBC, UA 21-50 0 - 5 WBC/hpf   Bacteria, UA RARE (A) NONE SEEN   Squamous Epithelial / HPF 0-5 0 - 5 /HPF   Mucus PRESENT    Hyaline Casts, UA  PRESENT     Comment: Performed at [facility], [address], [place], [place] [address]  Urine rapid drug screen (hosp performed)     Status: None   Collection Time: 06/09/24  4:57 PM  Result Value Ref Range   Opiates NONE DETECTED NONE DETECTED   Cocaine NONE DETECTED NONE DETECTED   Benzodiazepines NONE DETECTED NONE DETECTED   Amphetamines NONE DETECTED NONE DETECTED   Tetrahydrocannabinol NONE DETECTED NONE DETECTED   Barbiturates NONE DETECTED NONE DETECTED    Comment: (NOTE) DRUG SCREEN FOR MEDICAL PURPOSES ONLY.  IF CONFIRMATION IS NEEDED FOR ANY PURPOSE, NOTIFY LAB WITHIN 5 DAYS.  LOWEST DETECTABLE LIMITS FOR URINE DRUG SCREEN Drug Class                     Cutoff (ng/mL) Amphetamine and metabolites    1000 Barbiturate and metabolites    200 Benzodiazepine                 200 Opiates and metabolites        300 Cocaine and metabolites        300 THC                            50 Performed at [facility], [address], [place], [place] [address]   Ammonia     Status: None   Collection Time: 06/09/24  5:08 PM  Result Value Ref Range   Ammonia 30 9 - 35 umol/L    Comment: Performed at [facility], [address] [place], [place] [address]  Ethanol     Status: None   Collection Time: 06/09/24  5:08 PM  Result Value Ref Range   Alcohol, Ethyl (B) <15 <15 mg/dL    Comment: (NOTE) For medical purposes only. Performed at [facility], [address], [place], [place] [address]   Hepatic function panel     Status: None   Collection Time: 06/09/24  5:08 PM  Result Value Ref Range   Total Protein 7.6 6.5 - 8.1 g/dL   Albumin 3.8 3.5 - 5.0 g/dL   AST 39 15 - 41 U/L   ALT 19 0 - 44 U/L   Alkaline Phosphatase 91 38 - 126 U/L   Total Bilirubin 0.9 0.0 - 1.2 mg/dL   Bilirubin, Direct 0.2 0.0 - 0.2 mg/dL   Indirect Bilirubin 0.7 0.3 - 0.9 mg/dL    Comment: Performed at [facility], [address] [place], [place] [address]  Blood gas, venous     Status: Abnormal    Collection Time: 06/09/24  5:13 PM  Result Value Ref Range   pH, Ven 7.32 7.25 - 7.43   pCO2, Ven 58 44 - 60 mmHg   pO2, Ven <31 (LL) 32 - 45 mmHg    Comment: CRITICAL RESULT CALLED TO, READ BACK BY AND VERIFIED WITH: NEWMAN,J ON 06/09/24 AT 1739 BY LOY,C    Bicarbonate 30.1 (H) 20.0 - 28.0 mmol/L   Acid-Base Excess 2.7 (H) 0.0 - 2.0 mmol/L   O2 Saturation 25.3 %   Patient temperature 37.6    Collection site LEFT ANTECUBITAL    Drawn by [phone]     Comment: Performed at [facility], [address] [place], [place] [address]  Lithium  level     Status: Abnormal   Collection Time: 06/09/24  5:13 PM  Result Value Ref Range   Lithium  Lvl 2.30 (HH) 0.60 - 1.20 mmol/L    Comment: CRITICAL RESULT CALLED TO, READ BACK BY AND VERIFIED WITH DOROTHA BAL RN @2014  ON 06/09/24 MAB Performed at [facility], [address] [place], [place] [address]   Urine Culture     Status: None   Collection Time: 06/09/24  6:41 PM   Specimen: Urine, Clean Catch  Result Value Ref Range   Specimen Description      URINE, CLEAN CATCH Performed at [facility], [address] [place], [place] [address]    Special Requests      NONE Performed at [facility], [address], [place], [place] [address]    Culture      NO GROWTH Performed at [facility], [address], [place], [place] [address]    Report Status 06/10/2024 FINAL   CBG monitoring, ED     Status: Abnormal   Collection Time: 06/09/24  7:52 PM  Result Value Ref Range   Glucose-Capillary 106 (H) 70 - 99 mg/dL    Comment: Glucose reference range applies only to samples taken after fasting for at least 8 hours.  Glucose, capillary     Status: Abnormal   Collection Time: 06/09/24 10:27 PM  Result Value Ref Range   Glucose-Capillary 125 (H) 70 - 99 mg/dL    Comment: Glucose reference range applies only to samples taken after fasting for at least 8 hours.  Lithium  level     Status: Abnormal   Collection  Time: 06/09/24 11:05 PM  Result  Value Ref Range   Lithium  Lvl 2.26 (HH) 0.60 - 1.20 mmol/L    Comment: CRITICAL RESULT CALLED TO, READ BACK BY AND VERIFIED WITH E.INGOLD RN [phone] 06/10/2024 BY G.GANADEN Performed at [facility], [address], [place], [place] [address]   HIV Antibody (routine testing w rflx)     Status: None   Collection Time: 06/09/24 11:05 PM  Result Value Ref Range   HIV Screen 4th Generation wRfx Non Reactive Non Reactive    Comment: Performed at [facility], [address], [place], [place] [address]  Lithium  level     Status: Abnormal   Collection Time: 06/10/24  4:58 AM  Result Value Ref Range   Lithium  Lvl 2.19 (HH) 0.60 - 1.20 mmol/L    Comment: CRITICAL RESULT CALLED TO, READ BACK BY AND VERIFIED WITH JADA MUSE RN @0826  08.07.2025 E.AHMED Performed at [facility], [address], [place], [place] [address]   Basic metabolic panel with GFR     Status: Abnormal   Collection Time: 06/10/24  4:58 AM  Result Value Ref Range   Sodium 132 (L) 135 - 145 mmol/L   Potassium 4.2 3.5 - 5.1 mmol/L   Chloride 103 98 - 111 mmol/L   CO2 23 22 - 32 mmol/L   Glucose, Bld 117 (H) 70 - 99 mg/dL    Comment: Glucose reference range applies only to samples taken after fasting for at least 8 hours.   BUN 37 (H) 6 - 20 mg/dL   Creatinine, Ser 8.41 (H) 0.61 - 1.24 mg/dL   Calcium  8.8 (L) 8.9 - 10.3 mg/dL   GFR, Estimated 54 (L) >60 mL/min    Comment: (NOTE) Calculated using the CKD-EPI Creatinine Equation (2021)    Anion gap 6 5 - 15    Comment: Performed at [facility], [address] [place], [place] [address]  CBC     Status: Abnormal   Collection Time: 06/10/24  4:58 AM  Result Value Ref Range   WBC 13.6 (H) 4.0 - 10.5 K/uL   RBC 4.66 4.22 - 5.81 MIL/uL   Hemoglobin 13.1 13.0 - 17.0 g/dL   HCT 58.4 60.9 - 47.9 %   MCV 89.1 80.0 - 100.0 fL   MCH 28.1 26.0 - 34.0 pg   MCHC 31.6 30.0 - 36.0 g/dL   RDW 86.1 88.4 - 84.4 %   Platelets 310 150 - 400 K/uL   nRBC 0.0 0.0 - 0.2 %     Comment: Performed at [facility], [address] [place], [place] [address]  Glucose, capillary     Status: Abnormal   Collection Time: 06/10/24  7:27 AM  Result Value Ref Range   Glucose-Capillary 127 (H) 70 - 99 mg/dL    Comment: Glucose reference range applies only to samples taken after fasting for at least 8 hours.   Comment 1 Notify RN    Comment 2 Document in Chart   Lithium  level     Status: Abnormal   Collection Time: 06/10/24 10:52 AM  Result Value Ref Range   Lithium  Lvl 2.01 (HH) 0.60 - 1.20 mmol/L    Comment: CRITICAL RESULT CALLED TO, READ BACK BY AND VERIFIED WITH J. MUSE, RN [phone] 06/10/24 BY [facility] Performed at [facility], [address] [place], [place] [address]   Glucose, capillary     Status: Abnormal   Collection Time: 06/10/24 11:14 AM  Result Value  Ref Range   Glucose-Capillary 184 (H) 70 - 99 mg/dL    Comment: Glucose reference range applies only to samples taken after fasting for at least 8 hours.   Comment 1 Notify RN    Comment 2 Document in Chart   Glucose, capillary     Status: Abnormal   Collection Time: 06/10/24  4:31 PM  Result Value Ref Range   Glucose-Capillary 150 (H) 70 - 99 mg/dL    Comment: Glucose reference range applies only to samples taken after fasting for at least 8 hours.   Comment 1 Notify RN    Comment 2 Document in Chart   Lithium  level     Status: Abnormal   Collection Time: 06/10/24  6:57 PM  Result Value Ref Range   Lithium  Lvl 2.05 (HH) 0.60 - 1.20 mmol/L    Comment: CRITICAL RESULT CALLED TO, READ BACK BY AND VERIFIED WITH MANFORD PARAS, RN [id number] 06/11/2024 SANDOVAL K Performed at [facility], [address], [place], [place] [address]   Glucose, capillary     Status: Abnormal   Collection Time: 06/10/24  7:43 PM  Result Value Ref Range   Glucose-Capillary 200 (H) 70 - 99 mg/dL    Comment: Glucose reference range applies only to samples taken after fasting for at least 8 hours.  Lithium  level     Status: Abnormal    Collection Time: 06/10/24 10:50 PM  Result Value Ref Range   Lithium  Lvl 1.93 (HH) 0.60 - 1.20 mmol/L    Comment: CRITICAL RESULT CALLED TO, READ BACK BY AND VERIFIED WITH MANFORD PARAS, RN [id number] 06/11/2024 SANDOVAL K Performed at [facility], [address], [place], [place] [address]   Basic metabolic panel with GFR     Status: Abnormal   Collection Time: 06/11/24  5:16 AM  Result Value Ref Range   Sodium 135 135 - 145 mmol/L   Potassium 4.5 3.5 - 5.1 mmol/L   Chloride 105 98 - 111 mmol/L   CO2 24 22 - 32 mmol/L   Glucose, Bld 118 (H) 70 - 99 mg/dL    Comment: Glucose reference range applies only to samples taken after fasting for at least 8 hours.   BUN 26 (H) 6 - 20 mg/dL   Creatinine, Ser 8.88 0.61 - 1.24 mg/dL   Calcium  9.0 8.9 - 10.3 mg/dL   GFR, Estimated >39 >39 mL/min    Comment: (NOTE) Calculated using the CKD-EPI Creatinine Equation (2021)    Anion gap 6 5 - 15    Comment: Performed at [facility], [address], [place], [place] [address]  Magnesium     Status: Abnormal   Collection Time: 06/11/24  5:16 AM  Result Value Ref Range   Magnesium 2.8 (H) 1.7 - 2.4 mg/dL    Comment: Performed at [facility], [address] [place], [place] [address]  CBC     Status: Abnormal   Collection Time: 06/11/24  5:16 AM  Result Value Ref Range   WBC 9.4 4.0 - 10.5 K/uL   RBC 4.41 4.22 - 5.81 MIL/uL   Hemoglobin 12.8 (L) 13.0 - 17.0 g/dL   HCT 59.9 60.9 - 47.9 %   MCV 90.7 80.0 - 100.0 fL   MCH 29.0 26.0 - 34.0 pg   MCHC 32.0 30.0 - 36.0 g/dL   RDW 86.3 88.4 - 84.4 %   Platelets 229 150 - 400 K/uL   nRBC 0.0 0.0 - 0.2 %    Comment:  Performed at [facility], [address] [place], [place] [address]  Glucose, capillary     Status: Abnormal   Collection Time: 06/11/24  7:26 AM  Result Value Ref Range   Glucose-Capillary 128 (H) 70 - 99 mg/dL    Comment: Glucose reference range applies only to samples taken after fasting for at least 8 hours.   Comment 1 Notify RN     Comment 2 Document in Chart    No results found.  PMH:   Past Medical History:  Diagnosis Date   Anal fissure    Anxiety    BPH (benign prostatic hyperplasia)    Depression    sees Dr. Elna Lo    Diabetes mellitus without complication (HCC)    Fever of unknown origin    seeing Dr. Chyrl Fenton    Hypertension    Morbid obesity (HCC)    Nausea    chronic- Dr Avram   Overactive bladder    Overactive bladder    Pain, low back    Plantar fasciitis    Sleep apnea    uses cpap   Ureterolithiasis     PSH:   Past Surgical History:  Procedure Laterality Date   ANAL SPHINCTEROTOMY     dr cornett 06/04/09   APPENDECTOMY     CERVICAL FUSION  2017   ESOPHAGOGASTRODUODENOSCOPY     dr avram - normal   lab band  06/06/2008   dr mikell    Allergies:  Allergies  Allergen Reactions   Acetaminophen  Hives, Itching and Nausea And Vomiting   Aspartame Hives, Nausea And Vomiting and Nausea Only    Headache   Ciprofloxacin Hives and Dermatitis   Sulfa Antibiotics Hives and Nausea Only   Etodolac Other (See Comments)    Caused legs to ache   Metronidazole Nausea And Vomiting   Phenylalanine Hives and Nausea And Vomiting    Headache   Sulfamethoxazole-Trimethoprim Itching    Medications:   Prior to Admission medications   Medication Sig Start Date End Date Taking? Authorizing Provider  atorvastatin  (LIPITOR) 20 MG tablet Take 1 tablet by mouth daily. 06/14/19  Yes [provider]  betamethasone dipropionate 0.05 % cream Apply 1 application topically daily as needed (itching).  02/24/20  Yes [provider]  chlorhexidine (PERIDEX) 0.12 % solution Use as directed 15 mLs in the mouth or throat daily.  03/28/20  Yes [provider]  clopidogrel  (PLAVIX ) 75 MG tablet Take 1 tablet by mouth daily. 06/23/23  Yes [provider]  clotrimazole-betamethasone (LOTRISONE) cream Apply 1 application topically daily as needed (itching).  02/23/20  Yes  [provider]  ESZOPICLONE 3 MG tablet Take 3 mg by mouth at bedtime as needed (sleep). 03/23/20  Yes [provider]  famotidine  (PEPCID ) 20 MG tablet Take 1 tablet (20 mg total) by mouth 2 (two) times daily. Patient taking differently: Take 20 mg by mouth 2 (two) times daily as needed for heartburn or indigestion. 04/13/20  Yes Desiderio Chew, PA-C  FARXIGA 10 MG TABS tablet Take 10 mg by mouth every evening.  08/09/18  Yes [provider]  fentaNYL  (DURAGESIC ) 50 MCG/HR Place 1 patch onto the skin every 3 (three) days. 02/18/22 07/01/24 Yes [provider]  gabapentin  (NEURONTIN ) 300 MG capsule Take 600 mg by mouth 3 (three) times daily. 10/06/17  Yes [provider]  hydrOXYzine  (ATARAX /VISTARIL ) 10 MG tablet Take 10 mg by mouth 3 (three) times daily. 10/01/17  Yes [provider]  hydrOXYzine  (ATARAX /VISTARIL ) 50 MG tablet Take 50 mg by mouth at bedtime as needed for anxiety.  03/23/20  Yes [provider]  ibuprofen  (ADVIL ) 200 MG tablet Take 600 mg by mouth every 6 (six) hours as needed for mild pain (pain score 1-3).   Yes [provider]  insulin  aspart protamine- aspart (NOVOLOG  MIX 70/30) (70-30) 100 UNIT/ML injection Inject 16 Units into the skin in the morning, at noon, and at bedtime. Sliding Scale   Yes [provider]  LAMICTAL  200 MG tablet Take 200 mg by mouth at bedtime.  03/23/20  Yes [provider]  lisinopril (ZESTRIL) 10 MG tablet Take 10 mg by mouth daily. 06/14/19  Yes [provider]  lithium  carbonate (LITHOBID) 300 MG CR tablet Take 300 mg by mouth 2 (two) times daily. 03/20/20  Yes [provider]  metFORMIN (GLUCOPHAGE-XR) 500 MG 24 hr tablet Take 500 mg by mouth 2 (two) times daily. 03/27/20  Yes [provider]  nebivolol (BYSTOLIC) 2.5 MG tablet Take 2.5 mg by mouth daily.   Yes [provider]  OLANZapine  (ZYPREXA ) 10 MG tablet Take 10 mg by mouth at  bedtime as needed (sleep).   Yes [provider]  omeprazole  (PRILOSEC) 20 MG capsule Take 20 mg by mouth 2 (two) times daily as needed (heartburn, indigestion). 03/26/20  Yes [provider]  ondansetron  (ZOFRAN ) 4 MG tablet Take 4 mg by mouth every 8 (eight) hours as needed for vomiting. 03/23/20  Yes [provider]  oxyCODONE  (ROXICODONE ) 15 MG immediate release tablet Take 15 mg by mouth every 6 (six) hours as needed for pain.   Yes [provider]  OZEMPIC, 2 MG/DOSE, 8 MG/3ML SOPN Inject 2 mg into the skin once a week. 03/25/24  Yes [provider]  pantoprazole  (PROTONIX ) 20 MG tablet Take 20 mg by mouth daily as needed for heartburn or indigestion. 02/09/24  Yes [provider]  QUEtiapine (SEROQUEL) 400 MG tablet Take 600 mg by mouth at bedtime as needed (sleep). 03/23/20  Yes [provider]  testosterone cypionate (DEPOTESTOSTERONE CYPIONATE) 200 MG/ML injection Inject 0.5 mLs into the muscle every 14 (fourteen) days. Wednesday 04/27/18 06/09/24 Yes [provider]  TRESIBA  FLEXTOUCH 200 UNIT/ML SOPN Inject 100 Units into the skin every evening.  10/03/17  Yes [provider]  WEGOVY 2.4 MG/0.75ML SOAJ SQ injection Inject 0.75 mLs into the skin every Friday. 05/10/24  Yes [provider]  Continuous Glucose Sensor (DEXCOM G6 SENSOR) MISC DISPENSE AND USE AS DIRECTED 07/06/19   [provider]    Discontinued Meds:   Medications Discontinued During This Encounter  Medication Reason   OZEMPIC, 1 MG/DOSE, 2 MG/1.5ML SOPN Dose change   fentaNYL  (DURAGESIC  - DOSED MCG/HR) 25 MCG/HR patch Dose change   Oxycodone  HCl 10 MG TABS Dose change   XYOSTED 75 MG/0.5ML SOAJ Change in therapy   lamoTRIgine  (LAMICTAL ) 150 MG tablet Dose change   venlafaxine  XR (EFFEXOR -XR) 75 MG 24 hr capsule Patient Preference   traZODone (DESYREL) 50 MG tablet Patient Preference   nortriptyline  (PAMELOR ) 25 MG capsule Patient  Preference   DULoxetine (CYMBALTA) 20 MG capsule Patient Preference   MYRBETRIQ 25 MG TB24 tablet Discontinued by provider   tiZANidine  (ZANAFLEX ) 4 MG tablet Change in therapy   metaxalone  (SKELAXIN ) 800 MG tablet Patient Preference   nystatin (MYCOSTATIN/NYSTOP) powder Patient Preference   lidocaine  (XYLOCAINE ) 5 % ointment Patient Preference   lidocaine  (XYLOCAINE ) 5 % ointment Patient Preference  0.9 %  sodium chloride  infusion     Social History:  reports that he has never smoked. He has never used smokeless tobacco. He reports that he does not drink alcohol and does not use drugs.  Family History:   Family History  Problem Relation Age of Onset   Cancer Father        colon   Depression Other    Hypertension Other    Diabetes Other    COPD Other        adrenal tumors   Cystic fibrosis Other        paternall cousins   Inflammatory bowel disease Other        paternall aunt    Blood pressure (!) 103/48, pulse 67, temperature 98.4 F (36.9 C), temperature source Axillary, resp. rate 16, height 6' 4 (1.93 m), weight (!) 285 kg, SpO2 96%. Physical Exam: General: well-appearing, NAD, morbidly obese HEENT: NCAT, anicteric sclera CV: RRR Lungs: CTA b/l Abd: SNDNT+BS Skin: no visible lesions or rashes Psych: alert, engaged, appropriate mood and affect  Musculoskeletal: no obvious deformities Neuro: normal speech, no gross focal deficits, walking around room       Manuelita DELENA Barters, MD 06/11/2024, 9:36 AM

## 2024-06-11 NOTE — Progress Notes (Signed)
 This RN spoke with Marcus at [employer]. Most recent lithium  is 1.58. Her recommendation is to repeat an EKG in the AM. Kellogg RN

## 2024-06-11 NOTE — Plan of Care (Signed)

## 2024-06-11 NOTE — Progress Notes (Signed)
 Date and time results received: 06/11/24 2017   Test: Lithium  Critical Value: 1.58  Name of Provider Notified: Grunz  Orders Received? Or Actions Taken?: MD informed.  Kellogg RN

## 2024-06-12 DIAGNOSIS — F1120 Opioid dependence, uncomplicated: Secondary | ICD-10-CM | POA: Diagnosis not present

## 2024-06-12 DIAGNOSIS — N179 Acute kidney failure, unspecified: Secondary | ICD-10-CM | POA: Diagnosis not present

## 2024-06-12 DIAGNOSIS — T56891A Toxic effect of other metals, accidental (unintentional), initial encounter: Secondary | ICD-10-CM | POA: Diagnosis not present

## 2024-06-12 LAB — BASIC METABOLIC PANEL WITH GFR
Anion gap: 6 (ref 5–15)
BUN: 17 mg/dL (ref 6–20)
CO2: 21 mmol/L — ABNORMAL LOW (ref 22–32)
Calcium: 8.9 mg/dL (ref 8.9–10.3)
Chloride: 108 mmol/L (ref 98–111)
Creatinine, Ser: 0.96 mg/dL (ref 0.61–1.24)
GFR, Estimated: >60 mL/min (ref >60)
Glucose, Bld: 119 mg/dL — ABNORMAL HIGH (ref 70–99)
Potassium: 4.2 mmol/L (ref 3.5–5.1)
Sodium: 135 mmol/L (ref 135–145)

## 2024-06-12 LAB — GLUCOSE, CAPILLARY
Glucose-Capillary: 127 mg/dL — ABNORMAL HIGH (ref 70–99)
Glucose-Capillary: 114 mg/dL — ABNORMAL HIGH (ref 70–99)
Glucose-Capillary: 173 mg/dL — ABNORMAL HIGH (ref 70–99)
Glucose-Capillary: 165 mg/dL — ABNORMAL HIGH (ref 70–99)
Glucose-Capillary: 127 mg/dL — ABNORMAL HIGH (ref 70–99)

## 2024-06-12 LAB — LITHIUM LEVEL
Lithium Lvl: 1.57 mmol/L (ref 0.60–1.20)
Lithium Lvl: 1.5 mmol/L — ABNORMAL HIGH (ref 0.60–1.20)
Lithium Lvl: 1.37 mmol/L — ABNORMAL HIGH (ref 0.60–1.20)
Lithium Lvl: 1.24 mmol/L — ABNORMAL HIGH (ref 0.60–1.20)

## 2024-06-12 MED ORDER — CYANOCOBALAMIN 1000 MCG/ML IJ SOLN
1000.0000 ug | Freq: Once | INTRAMUSCULAR | Status: AC
  Administered 2024-06-12: 1000 ug via INTRAMUSCULAR
  Filled 2024-06-12: qty 1

## 2024-06-12 MED ORDER — SODIUM CHLORIDE 0.9 % IV SOLN: INTRAVENOUS | Status: DC

## 2024-06-12 MED ORDER — VITAMIN B-12 100 MCG PO TABS
500.0000 ug | ORAL_TABLET | Freq: Every day | ORAL | Status: DC
  Administered 2024-06-13: 500 ug via ORAL
  Filled 2024-06-12: qty 5

## 2024-06-12 NOTE — Progress Notes (Signed)
 PROGRESS NOTE  Angel Pope FMW:[id number] DOB: February 17, 1976 DOA: 06/09/2024 PCP: Reita Locus, MD  Brief History:  48 year old male with a history of diabetes mellitus type 2, hypertension, hyperlipidemia, depression/anxiety, OSA, and obesity presenting with tremors, confusion, and difficulty concentrating.  He stated that the symptoms began on the evening of 06/08/2024.  He had some subjective chills but denied any fevers.  He denied any headache, neck pain, focal extremity weakness, chest pain, shortness breath, coughing, hemoptysis, nausea, vomiting or diarrhea, abdominal pain.  There is no dysuria or hematuria.  The patient states that he has chronic nausea with intermittent vomiting.  This is unchanged from his baseline.  In the ED, the patient had a temperature of 100.0 F.  He was hemodynamically stable.  Oxygen saturation was 100% room air.  WBC 13.6, hemoglobin 13.1, platelets 310.  Sodium 132, potassium 4.2, bicarbonate 19, serum creatinine 2.22.  LFTs were unremarkable. Lithium  level was 2.30.  UDS is negative.  UA showed 21-50 WBC.  VBG showed 7.32/58/<31/30.  The patient was started on IV fluids.  Nephrology was consulted to assist with management.   Assessment/Plan: Lithium  toxicity -Presented with confusion, tremors, and renal failure -Initial lithium  level 2.30>> 2.26>>2.19>>2.01>>2.05>>1.93>>1.95>>1.57>>1.50>>1.37 -Continue IV fluids -Continue to trend lithium  levels -Appreciate nephrology consult -discussed with patient's psychiatrist, Dr. Ford not restart lithium  at time of d/c   AKI -Baseline creatinine 0.8-1.0 -Presented with serum creatinine 2.22 -Continue IV fluids>>improved   Acute metabolic encephalopathy -Secondary to lithium  toxicity -Overall improved -UA with 21-50 WBC -urine culture neg -Continue ceftriaxone --give 3 days -Temporarily holding lamotrigine , gabapentin  -Patient no longer taking quetiapine or hydroxyzine  -check B12--132>>give  IM inj and start po -chech TSH--1.285 -Folate 8.6 -ammonia = 23   Essential hypertension -Holding lisinopril secondary to AKI - Holding Nebivolol secondary to soft blood pressure   Diabetes mellitus type 2 with hyperglycemia -Restart long-acting insulin  at lower dose -NovoLog  sliding scale -Holding metformin -Patient doses of Wegovy on Fridays -Holding Farxiga - 8/6 Hemoglobin A1c--5.9   Opioid dependence - PDMP reviewed - Oxycodone , 15 mg, #120, last refill 05/29/2024 - Fentanyl  TD, 50 mcg, last refill 06/01/2024 - Gabapentin  300 mg, #90, last refill 05/12/2024   Morbid obesity - BMI 76.48 - Lifestyle modification   Anxiety state - Patient is on lithium , lamotrigine  - discussed with pt's psychiatrist>>pt is on lamotrigine  150 mg bid - Holding lithium    OSA - Continue CPAP                   Family Communication: no  Family at bedside   Consultants:  renal   Code Status:  FULL    DVT Prophylaxis:  [place] Heparin      Procedures: As Listed in Progress Note Above   Antibiotics: Ceftriaxone  8/6>>8/8         Subjective: Patient denies fevers, chills, headache, chest pain, dyspnea, nausea, vomiting, diarrhea, abdominal pain, dysuria, hematuria, hematochezia, and melena.   Objective: Vitals:   06/11/24 1316 06/11/24 2121 06/12/24 0459 06/12/24 1302  BP: 134/70 134/75 132/77 137/70  Pulse: 82 76 76 73  Resp:  20 20   Temp: 98.9 F (37.2 C) 98.6 F (37 C) 97.9 F (36.6 C) 98.3 F (36.8 C)  TempSrc: Oral Oral Oral Oral  SpO2: 100% 96% 100% 100%  Weight:      Height:        Intake/Output Summary (Last 24 hours) at 06/12/2024 1630 Last data filed at 06/12/2024 1622 Gross  per 24 hour  Intake 1689.65 ml  Output 401 ml  Net 1288.65 ml   Weight change:  Exam:  General:  Pt is alert, follows commands appropriately, not in acute distress HEENT: No icterus, No thrush, No neck mass, /AT Cardiovascular: RRR, S1/S2, no rubs, no gallops Respiratory:  CTA bilaterally, no wheezing, no crackles, no rhonchi Abdomen: Soft/+BS, non tender, non distended, no guarding Extremities: No edema, No lymphangitis, No petechiae, No rashes, no synovitis   Data Reviewed: I have personally reviewed following labs and imaging studies Basic Metabolic Panel: Recent Labs  Lab 06/09/24 1649 06/10/24 0458 06/11/24 0516 06/12/24 0603  NA 132* 132* 135 135  K 4.2 4.2 4.5 4.2  CL 101 103 105 108  CO2 19* 23 24 21*  GLUCOSE 172* 117* 118* 119*  BUN 39* 37* 26* 17  CREATININE 2.22* 1.58* 1.11 0.96  CALCIUM  9.0 8.8* 9.0 8.9  MG  --   --  2.8*  --    Liver Function Tests: Recent Labs  Lab 06/09/24 1708  AST 39  ALT 19  ALKPHOS 91  BILITOT 0.9  PROT 7.6  ALBUMIN 3.8   No results for input(s): LIPASE, AMYLASE in the last 168 hours. Recent Labs  Lab 06/09/24 1708 06/11/24 1802  AMMONIA 30 23   Coagulation Profile: No results for input(s): INR, PROTIME in the last 168 hours. CBC: Recent Labs  Lab 06/09/24 1649 06/10/24 0458 06/11/24 0516  WBC 11.3* 13.6* 9.4  NEUTROABS 9.7*  --   --   HGB 14.1 13.1 12.8*  HCT 44.5 41.5 40.0  MCV 89.5 89.1 90.7  PLT 298 310 229   Cardiac Enzymes: No results for input(s): CKTOTAL, CKMB, CKMBINDEX, TROPONINI in the last 168 hours. BNP: Invalid input(s): POCBNP CBG: Recent Labs  Lab 06/11/24 2123 06/12/24 0159 06/12/24 0717 06/12/24 1106 06/12/24 1602  GLUCAP 174* 127* 114* 173* 165*   HbA1C: Recent Labs    06/09/24 1649  HGBA1C 5.9*   Urine analysis:    Component Value Date/Time   COLORURINE YELLOW 06/09/2024 1657   APPEARANCEUR HAZY (A) 06/09/2024 1657   LABSPEC 1.015 06/09/2024 1657   PHURINE 5.0 06/09/2024 1657   GLUCOSEU >=500 (A) 06/09/2024 1657   HGBUR NEGATIVE 06/09/2024 1657   HGBUR negative 05/15/2010 1552   BILIRUBINUR NEGATIVE 06/09/2024 1657   KETONESUR NEGATIVE 06/09/2024 1657   PROTEINUR 30 (A) 06/09/2024 1657   UROBILINOGEN 0.2 03/27/2013 1304    NITRITE NEGATIVE 06/09/2024 1657   LEUKOCYTESUR TRACE (A) 06/09/2024 1657   Sepsis Labs: @LABRCNTIP (procalcitonin:4,lacticidven:4) ) Recent Results (from the past 240 hours)  Urine Culture     Status: None   Collection Time: 06/09/24  6:41 PM   Specimen: Urine, Clean Catch  Result Value Ref Range Status   Specimen Description   Final    URINE, CLEAN CATCH Performed at [facility], [address], [place], [place] [address]    Special Requests   Final    NONE Performed at [facility], [address], [place], [place] [address]    Culture   Final    NO GROWTH Performed at [facility], [address] [place], [place] [address]    Report Status 06/10/2024 FINAL  Final     Scheduled Meds:  atorvastatin   20 mg Oral QHS   clopidogrel   75 mg Oral Daily   heparin   5,000 Units Subcutaneous Q8H   insulin  aspart  0-15 Units Subcutaneous TID WC   insulin  aspart  0-5 Units Subcutaneous  QHS   insulin  glargine-yfgn  40 Units Subcutaneous QPM   lamoTRIgine   150 mg Oral BID   OLANZapine   10 mg Oral QHS   Continuous Infusions:  sodium chloride  125 mL/hr at 06/12/24 1622    Procedures/Studies: No results found.  Alm Schneider, DO  [facility]  If 7PM-7AM, please contact night-coverage [email or web address] Password [email or web address] 06/12/2024, 4:30 PM   LOS: 3 days

## 2024-06-12 NOTE — Plan of Care (Signed)
   Problem: Education: Goal: Knowledge of General Education information will improve Description Including pain rating scale, medication(s)/side effects and non-pharmacologic comfort measures Outcome: Progressing

## 2024-06-13 DIAGNOSIS — N179 Acute kidney failure, unspecified: Secondary | ICD-10-CM | POA: Diagnosis not present

## 2024-06-13 DIAGNOSIS — F1120 Opioid dependence, uncomplicated: Secondary | ICD-10-CM | POA: Diagnosis not present

## 2024-06-13 DIAGNOSIS — T56891D Toxic effect of other metals, accidental (unintentional), subsequent encounter: Secondary | ICD-10-CM | POA: Diagnosis not present

## 2024-06-13 LAB — BASIC METABOLIC PANEL WITH GFR
Anion gap: 4 — ABNORMAL LOW (ref 5–15)
BUN: 13 mg/dL (ref 6–20)
CO2: 21 mmol/L — ABNORMAL LOW (ref 22–32)
Calcium: 8.5 mg/dL — ABNORMAL LOW (ref 8.9–10.3)
Chloride: 109 mmol/L (ref 98–111)
Creatinine, Ser: 0.89 mg/dL (ref 0.61–1.24)
GFR, Estimated: >60 mL/min (ref >60)
Glucose, Bld: 131 mg/dL — ABNORMAL HIGH (ref 70–99)
Potassium: 3.8 mmol/L (ref 3.5–5.1)
Sodium: 134 mmol/L — ABNORMAL LOW (ref 135–145)

## 2024-06-13 LAB — GLUCOSE, CAPILLARY: Glucose-Capillary: 121 mg/dL — ABNORMAL HIGH (ref 70–99)

## 2024-06-13 LAB — LITHIUM LEVEL
Lithium Lvl: 1.19 mmol/L (ref 0.60–1.20)
Lithium Lvl: 1.17 mmol/L (ref 0.60–1.20)

## 2024-06-13 MED ORDER — LAMOTRIGINE 150 MG PO TABS: 150.0000 mg | ORAL_TABLET | Freq: Two times a day (BID) | ORAL | Status: AC

## 2024-06-13 MED ORDER — CYANOCOBALAMIN 500 MCG PO TABS: 500.0000 ug | ORAL_TABLET | Freq: Every day | ORAL | Status: AC

## 2024-06-13 NOTE — Progress Notes (Signed)
 Being discharged to home. IV accessed removed and information packet given to patient. He stated he understood medication changes. Waiting for his transport to home.

## 2024-06-13 NOTE — Discharge Summary (Signed)
 Physician Discharge Summary   Patient: Angel Pope MRN: [id number] DOB: [birth year]  Admit date:     06/09/2024  Discharge date: 06/13/24  Discharge Physician: Alm Lucila Klecka   PCP: Reita Locus, MD   Recommendations at discharge:   Please follow up with primary care provider within 1-2 weeks  Please repeat BMP and CBC in one week     Hospital Course: 48 year old male with a history of diabetes mellitus type 2, hypertension, hyperlipidemia, depression/anxiety, OSA, and obesity presenting with tremors, confusion, and difficulty concentrating.  He stated that the symptoms began on the evening of 06/08/2024.  He had some subjective chills but denied any fevers.  He denied any headache, neck pain, focal extremity weakness, chest pain, shortness breath, coughing, hemoptysis, nausea, vomiting or diarrhea, abdominal pain.  There is no dysuria or hematuria.  The patient states that he has chronic nausea with intermittent vomiting.  This is unchanged from his baseline.  In the ED, the patient had a temperature of 100.0 F.  He was hemodynamically stable.  Oxygen saturation was 100% room air.  WBC 13.6, hemoglobin 13.1, platelets 310.  Sodium 132, potassium 4.2, bicarbonate 19, serum creatinine 2.22.  LFTs were unremarkable. Lithium  level was 2.30.  UDS is negative.  UA showed 21-50 WBC.  VBG showed 7.32/58/<31/30.  The patient was started on IV fluids.  Nephrology was consulted to assist with management. They recommended continue IV fluids and conservative care as the patient was slowly improving clinically.  With IV fluids, the patient's renal function gradually improved back to baseline.  His lithium  level gradually decreased and returned to therapeutic range..  The patient's mental status returned back to baseline.  He was monitored on telemetry without concerning dysrhythmias.  Assessment and Plan:  Lithium  toxicity -Presented with confusion, tremors, and renal failure -Initial lithium  level  2.30>> 2.26>>2.19>>2.01>>2.05>>1.93>>1.95>>1.57>>1.50>>1.37 -Continue IV fluids -Continue to trend lithium  levels -Appreciate nephrology consult -discussed with patient's psychiatrist, Dr. Ford not restart lithium  at time of d/c   AKI -Baseline creatinine 0.8-1.0 -Presented with serum creatinine 2.22 -Continue IV fluids>>improved   Acute metabolic encephalopathy -Secondary to lithium  toxicity -Overall improved -UA with 21-50 WBC -urine culture neg -Continue ceftriaxone --give 3 days -restart lamotrigine , gabapentin  -Patient no longer taking quetiapine or hydroxyzine  -check B12--132>>give IM inj and start po -chech TSH--1.285 -Folate 8.6 -ammonia = 23 -back to baseline at time of d/c   Essential hypertension -Holding lisinopril secondary to AKI - Holding Nebivolol secondary to soft blood pressure - restarted nebivolol as BP improved   Diabetes mellitus type 2 with hyperglycemia -Restart long-acting insulin  at lower dose -NovoLog  sliding scale -Holding metformin -Patient doses of Wegovy on Fridays -Holding Farxiga - 8/6 Hemoglobin A1c--5.9   Opioid dependence - PDMP reviewed - Oxycodone , 15 mg, #120, last refill 05/29/2024 - Fentanyl  TD, 50 mcg, last refill 06/01/2024 - Gabapentin  300 mg, #90, last refill 05/12/2024   Morbid obesity - BMI 76.48 - Lifestyle modification   Anxiety state - Patient is on lithium , lamotrigine  - discussed with pt's psychiatrist>>pt is on lamotrigine  150 mg bid - Holding lithium --will not restart at time of dc   OSA - Continue CPAP         Consultants: none Procedures performed: none  Disposition: Home Diet recommendation:  Cardiac and Carb modified diet DISCHARGE MEDICATION: Allergies as of 06/13/2024       Reactions   Acetaminophen  Hives, Itching, Nausea And Vomiting   Aspartame Hives, Nausea And Vomiting, Nausea Only   Headache   Ciprofloxacin Hives, Dermatitis  Sulfa Antibiotics Hives, Nausea Only   Etodolac Other  (See Comments)   Caused legs to ache   Metronidazole Nausea And Vomiting   Phenylalanine Hives, Nausea And Vomiting   Headache   Sulfamethoxazole-trimethoprim Itching        Medication List     STOP taking these medications    hydrOXYzine  10 MG tablet Commonly known as: ATARAX    hydrOXYzine  50 MG tablet Commonly known as: ATARAX    lisinopril 10 MG tablet Commonly known as: ZESTRIL   lithium  carbonate 300 MG ER tablet Commonly known as: LITHOBID   omeprazole  20 MG capsule Commonly known as: PRILOSEC   Ozempic (2 MG/DOSE) 8 MG/3ML Sopn Generic drug: Semaglutide (2 MG/DOSE)   QUEtiapine 400 MG tablet Commonly known as: SEROQUEL   testosterone cypionate 200 MG/ML injection Commonly known as: DEPOTESTOSTERONE CYPIONATE       TAKE these medications    atorvastatin  20 MG tablet Commonly known as: LIPITOR Take 1 tablet by mouth daily.   betamethasone dipropionate 0.05 % cream Apply 1 application topically daily as needed (itching).   chlorhexidine 0.12 % solution Commonly known as: PERIDEX Use as directed 15 mLs in the mouth or throat daily.   clopidogrel  75 MG tablet Commonly known as: PLAVIX  Take 1 tablet by mouth daily.   clotrimazole-betamethasone cream Commonly known as: LOTRISONE Apply 1 application topically daily as needed (itching).   cyanocobalamin  500 MCG tablet Commonly known as: VITAMIN B12 Take 1 tablet (500 mcg total) by mouth daily. Start taking on: June 14, 2024   Dexcom G6 Sensor Misc DISPENSE AND USE AS DIRECTED   eszopiclone 3 MG tablet Generic drug: Eszopiclone Take 3 mg by mouth at bedtime as needed (sleep).   famotidine  20 MG tablet Commonly known as: PEPCID  Take 1 tablet (20 mg total) by mouth 2 (two) times daily. What changed:  when to take this reasons to take this   Farxiga 10 MG Tabs tablet Generic drug: dapagliflozin propanediol Take 10 mg by mouth every evening.   fentaNYL  50 MCG/HR Commonly known as:  DURAGESIC  Place 1 patch onto the skin every 3 (three) days.   gabapentin  300 MG capsule Commonly known as: NEURONTIN  Take 600 mg by mouth 3 (three) times daily.   ibuprofen  200 MG tablet Commonly known as: ADVIL  Take 600 mg by mouth every 6 (six) hours as needed for mild pain (pain score 1-3).   insulin  aspart protamine- aspart (70-30) 100 UNIT/ML injection Commonly known as: NOVOLOG  MIX 70/30 Inject 16 Units into the skin in the morning, at noon, and at bedtime. Sliding Scale   lamoTRIgine  150 MG tablet Commonly known as: LAMICTAL  Take 1 tablet (150 mg total) by mouth 2 (two) times daily. What changed:  medication strength how much to take when to take this   metFORMIN 500 MG 24 hr tablet Commonly known as: GLUCOPHAGE-XR Take 500 mg by mouth 2 (two) times daily.   nebivolol 2.5 MG tablet Commonly known as: BYSTOLIC Take 2.5 mg by mouth daily.   OLANZapine  10 MG tablet Commonly known as: ZYPREXA  Take 10 mg by mouth at bedtime as needed (sleep).   ondansetron  4 MG tablet Commonly known as: ZOFRAN  Take 4 mg by mouth every 8 (eight) hours as needed for vomiting.   oxyCODONE  15 MG immediate release tablet Commonly known as: ROXICODONE  Take 15 mg by mouth every 6 (six) hours as needed for pain.   pantoprazole  20 MG tablet Commonly known as: PROTONIX  Take 20 mg by mouth daily as needed  for heartburn or indigestion.   Tresiba  FlexTouch 200 UNIT/ML FlexTouch Pen Generic drug: insulin  degludec Inject 100 Units into the skin every evening.   Wegovy 2.4 MG/0.75ML Soaj SQ injection Generic drug: semaglutide-weight management Inject 0.75 mLs into the skin every Friday.        Discharge Exam: Filed Weights   06/09/24 1624 06/09/24 2222  Weight: (!) 290.3 kg (!) 285 kg   HEENT:  [place]/AT, No thrush, no icterus CV:  RRR, no rub, no S3, no S4 Lung:  CTA, no wheeze, no rhonchi Abd:  soft/+BS, NT Ext:  No edema, no lymphangitis, no synovitis, no rash   Condition at  discharge: stable  The results of significant diagnostics from this hospitalization (including imaging, microbiology, ancillary and laboratory) are listed below for reference.   Imaging Studies: No results found.  Microbiology: Results for orders placed or performed during the hospital encounter of 06/09/24  Urine Culture     Status: None   Collection Time: 06/09/24  6:41 PM   Specimen: Urine, Clean Catch  Result Value Ref Range Status   Specimen Description   Final    URINE, CLEAN CATCH Performed at [facility], [address], [place], [place] [address]    Special Requests   Final    NONE Performed at [facility], [address], [place], [place] [address]    Culture   Final    NO GROWTH Performed at [facility], [address] [place], [place] [address]    Report Status 06/10/2024 FINAL  Final    Labs: CBC: Recent Labs  Lab 06/09/24 1649 06/10/24 0458 06/11/24 0516  WBC 11.3* 13.6* 9.4  NEUTROABS 9.7*  --   --   HGB 14.1 13.1 12.8*  HCT 44.5 41.5 40.0  MCV 89.5 89.1 90.7  PLT 298 310 229   Basic Metabolic Panel: Recent Labs  Lab 06/09/24 1649 06/10/24 0458 06/11/24 0516 06/12/24 0603 06/13/24 0534  NA 132* 132* 135 135 134*  K 4.2 4.2 4.5 4.2 3.8  CL 101 103 105 108 109  CO2 19* 23 24 21* 21*  GLUCOSE 172* 117* 118* 119* 131*  BUN 39* 37* 26* 17 13  CREATININE 2.22* 1.58* 1.11 0.96 0.89  CALCIUM  9.0 8.8* 9.0 8.9 8.5*  MG  --   --  2.8*  --   --    Liver Function Tests: Recent Labs  Lab 06/09/24 1708  AST 39  ALT 19  ALKPHOS 91  BILITOT 0.9  PROT 7.6  ALBUMIN 3.8   CBG: Recent Labs  Lab 06/12/24 0717 06/12/24 1106 06/12/24 1602 06/12/24 2117 06/13/24 0749  GLUCAP 114* 173* 165* 127* 121*    Discharge time spent: greater than 30 minutes.  Signed: Alm Schneider, MD [facility] 06/13/2024

## 2024-07-01 ENCOUNTER — Encounter (HOSPITAL_COMMUNITY): Payer: Self-pay

## 2024-07-01 ENCOUNTER — Emergency Department (HOSPITAL_COMMUNITY)

## 2024-07-01 ENCOUNTER — Other Ambulatory Visit: Payer: Self-pay

## 2024-07-01 ENCOUNTER — Emergency Department (HOSPITAL_COMMUNITY)
Admission: EM | Admit: 2024-07-01 | Discharge: 2024-07-01 | Disposition: A | Discharge status: Home / Self Care | Attending: Emergency Medicine | Admitting: Emergency Medicine

## 2024-07-01 DIAGNOSIS — Z7984 Long term (current) use of oral hypoglycemic drugs: Secondary | ICD-10-CM | POA: Insufficient documentation

## 2024-07-01 DIAGNOSIS — R1013 Epigastric pain: Secondary | ICD-10-CM | POA: Diagnosis present

## 2024-07-01 DIAGNOSIS — R0789 Other chest pain: Secondary | ICD-10-CM | POA: Diagnosis not present

## 2024-07-01 DIAGNOSIS — Z794 Long term (current) use of insulin: Secondary | ICD-10-CM | POA: Diagnosis not present

## 2024-07-01 DIAGNOSIS — Z7901 Long term (current) use of anticoagulants: Secondary | ICD-10-CM | POA: Diagnosis not present

## 2024-07-01 DIAGNOSIS — E119 Type 2 diabetes mellitus without complications: Secondary | ICD-10-CM | POA: Insufficient documentation

## 2024-07-01 LAB — CBC
HCT: 41.4 % (ref 39.0–52.0)
Hemoglobin: 13.5 g/dL (ref 13.0–17.0)
MCH: 28.1 pg (ref 26.0–34.0)
MCHC: 32.6 g/dL (ref 30.0–36.0)
MCV: 86.1 fL (ref 80.0–100.0)
Platelets: 255 K/uL (ref 150–400)
RBC: 4.81 MIL/uL (ref 4.22–5.81)
RDW: 13.4 % (ref 11.5–15.5)
WBC: 6.7 K/uL (ref 4.0–10.5)
nRBC: 0 % (ref 0.0–0.2)

## 2024-07-01 LAB — BASIC METABOLIC PANEL WITH GFR
Anion gap: 12 (ref 5–15)
BUN: 16 mg/dL (ref 6–20)
CO2: 21 mmol/L — ABNORMAL LOW (ref 22–32)
Calcium: 9.1 mg/dL (ref 8.9–10.3)
Chloride: 103 mmol/L (ref 98–111)
Creatinine, Ser: 0.79 mg/dL (ref 0.61–1.24)
GFR, Estimated: >60 mL/min (ref >60)
Glucose, Bld: 188 mg/dL — ABNORMAL HIGH (ref 70–99)
Potassium: 3.8 mmol/L (ref 3.5–5.1)
Sodium: 136 mmol/L (ref 135–145)

## 2024-07-01 LAB — TROPONIN I (HIGH SENSITIVITY)
Troponin I (High Sensitivity): 4 ng/L (ref <18)
Troponin I (High Sensitivity): 4 ng/L (ref <18)

## 2024-07-01 NOTE — ED Provider Notes (Signed)
 [place] EMERGENCY DEPARTMENT AT [facility] Provider Note   CSN: [id number] Arrival date & time: 07/01/24  1919     Patient presents with: Abdominal Pain   Angel Pope is a 48 y.o. male.   Patient is a 48 year old male with past medical history of prior lap band procedure, type 2 diabetes, anxiety.  Patient presenting today with complaints of abdominal pain and chest tightness.  This started earlier this evening while he was eating.  He describes tightness in his chest and upper abdomen that lasted for several hours, but has since resolved.  He denies any shortness of breath.  No vomiting or diarrhea.  He is uncertain as to whether he had a panic attack or something else occurred.       Prior to Admission medications   Medication Sig Start Date End Date Taking? Authorizing Provider  atorvastatin  (LIPITOR) 20 MG tablet Take 1 tablet by mouth daily. 06/14/19   [provider]  betamethasone dipropionate 0.05 % cream Apply 1 application topically daily as needed (itching).  02/24/20   [provider]  chlorhexidine (PERIDEX) 0.12 % solution Use as directed 15 mLs in the mouth or throat daily.  03/28/20   [provider]  clopidogrel  (PLAVIX ) 75 MG tablet Take 1 tablet by mouth daily. 06/23/23   [provider]  clotrimazole-betamethasone (LOTRISONE) cream Apply 1 application topically daily as needed (itching).  02/23/20   [provider]  Continuous Glucose Sensor (DEXCOM G6 SENSOR) MISC DISPENSE AND USE AS DIRECTED 07/06/19   [provider]  ESZOPICLONE 3 MG tablet Take 3 mg by mouth at bedtime as needed (sleep). 03/23/20   [provider]  famotidine  (PEPCID ) 20 MG tablet Take 1 tablet (20 mg total) by mouth 2 (two) times daily. Patient taking differently: Take 20 mg by mouth 2 (two) times daily as needed for heartburn or indigestion. 04/13/20   Desiderio Chew, PA-C  FARXIGA 10 MG TABS tablet Take 10 mg by mouth  every evening.  08/09/18   [provider]  fentaNYL  (DURAGESIC ) 50 MCG/HR Place 1 patch onto the skin every 3 (three) days. 02/18/22 07/01/24  [provider]  gabapentin  (NEURONTIN ) 300 MG capsule Take 600 mg by mouth 3 (three) times daily. 10/06/17   [provider]  ibuprofen  (ADVIL ) 200 MG tablet Take 600 mg by mouth every 6 (six) hours as needed for mild pain (pain score 1-3).    [provider]  insulin  aspart protamine- aspart (NOVOLOG  MIX 70/30) (70-30) 100 UNIT/ML injection Inject 16 Units into the skin in the morning, at noon, and at bedtime. Sliding Scale    [provider]  lamoTRIgine  (LAMICTAL ) 150 MG tablet Take 1 tablet (150 mg total) by mouth 2 (two) times daily. 06/13/24   Evonnie Lenis, MD  metFORMIN (GLUCOPHAGE-XR) 500 MG 24 hr tablet Take 500 mg by mouth 2 (two) times daily. 03/27/20   [provider]  nebivolol (BYSTOLIC) 2.5 MG tablet Take 2.5 mg by mouth daily.    [provider]  OLANZapine  (ZYPREXA ) 10 MG tablet Take 10 mg by mouth at bedtime as needed (sleep).    [provider]  ondansetron  (ZOFRAN ) 4 MG tablet Take 4 mg by mouth every 8 (eight) hours as needed for vomiting. 03/23/20   [provider]  oxyCODONE  (ROXICODONE ) 15 MG immediate release tablet Take 15 mg by mouth every 6 (six) hours as needed for pain.    [provider]  pantoprazole  (PROTONIX )  20 MG tablet Take 20 mg by mouth daily as needed for heartburn or indigestion. 02/09/24   [provider]  TRESIBA  FLEXTOUCH 200 UNIT/ML SOPN Inject 100 Units into the skin every evening.  10/03/17   [provider]  vitamin B-12 (VITAMIN B12) 500 MCG tablet Take 1 tablet (500 mcg total) by mouth daily. 06/14/24   Evonnie Lenis, MD  WEGOVY 2.4 MG/0.75ML SOAJ SQ injection Inject 0.75 mLs into the skin every Friday. 05/10/24   [provider]    Allergies: Acetaminophen , Aspartame, Ciprofloxacin, Sulfa antibiotics,  Etodolac, Metronidazole, Phenylalanine, and Sulfamethoxazole-trimethoprim    Review of Systems  All other systems reviewed and are negative.   Updated Vital Signs BP 127/65   Pulse 66   Temp 99.2 F (37.3 C) (Oral)   Resp 18   Ht 6' 4 (1.93 m)   Wt (!) 208.7 kg   SpO2 100%   BMI 55.99 kg/m   Physical Exam Vitals and nursing note reviewed.  Constitutional:      General: He is not in acute distress.    Appearance: He is well-developed. He is not diaphoretic.  HENT:     Head: Normocephalic and atraumatic.  Cardiovascular:     Rate and Rhythm: Normal rate and regular rhythm.     Heart sounds: No murmur heard.    No friction rub.  Pulmonary:     Effort: Pulmonary effort is normal. No respiratory distress.     Breath sounds: Normal breath sounds. No wheezing or rales.  Abdominal:     General: Bowel sounds are normal. There is no distension.     Palpations: Abdomen is soft.     Tenderness: There is no abdominal tenderness.  Musculoskeletal:        General: Normal range of motion.     Cervical back: Normal range of motion and neck supple.  Skin:    General: Skin is warm and dry.  Neurological:     Mental Status: He is alert and oriented to person, place, and time.     Coordination: Coordination normal.     (all labs ordered are listed, but only abnormal results are displayed) Labs Reviewed  BASIC METABOLIC PANEL WITH GFR - Abnormal; Notable for the following components:      Result Value   CO2 21 (*)    Glucose, Bld 188 (*)    All other components within normal limits  CBC  TROPONIN I (HIGH SENSITIVITY)  TROPONIN I (HIGH SENSITIVITY)    EKG: EKG Interpretation Date/Time:  Thursday July 01 2024 19:30:39 EDT Ventricular Rate:  77 PR Interval:  186 QRS Duration:  122 QT Interval:  396 QTC Calculation: 448 R Axis:   -60  Text Interpretation: Normal sinus rhythm Left anterior fascicular block Left ventricular hypertrophy with QRS widening ( R in aVL ,  Cornell product ) Abnormal ECG When compared with ECG of 12-Jun-2024 13:15, No significant change was found Confirmed by Geroldine Berg ([id number]) on 07/01/2024 11:42:07 PM  Radiology: [facility] Chest 2 View Result Date: 07/01/2024 CLINICAL DATA:  Chest pain.  History of panic attacks. EXAM: CHEST - 2 VIEW COMPARISON:  04/13/2020 FINDINGS: Normal heart size. Lungs are clear. No pleural effusion or pneumothorax. Mediastinal contours appear intact. IMPRESSION: No active cardiopulmonary disease. Electronically Signed   By: Elsie Gravely M.D.   On: 07/01/2024 19:49     Procedures   Medications Ordered in the ED - No data to display  Medical Decision Making Amount and/or Complexity of Data Reviewed Labs: ordered. Radiology: ordered.   Patient presenting here with complaints of abdominal and chest discomfort as described in the HPI.  Patient arrives here with stable vital signs and is afebrile.  Physical examination unremarkable.  Abdomen is benign.  Laboratory studies obtained including CBC, metabolic panel, and troponin x 2, all of which were basically unremarkable.  His blood sugar was 188 with no electrolyte derangement otherwise.  Chest x-ray showing no acute process.  Patient has been observed for several hours during which period of time he has had negative troponin x 2 and unremarkable laboratory studies.  His abdomen is benign the time of my evaluation he is symptom-free.  Cause of his symptoms unclear, but could be related to anxiety as suggested by the patient.  No evidence for cardiac etiology or other emergent process.  Patient to be discharged with as needed return.     Final diagnoses:  None    ED Discharge Orders     None          Geroldine Berg, MD 07/01/24 [phone]

## 2024-07-01 NOTE — ED Triage Notes (Signed)
 Complaining of pain in the upper left abdomen that started after he ate supper tonight. The pain then went into his chest and made his right arm hurt. All of those symptoms have resolved at this time but the abdomen pain is still there.

## 2024-07-01 NOTE — Discharge Instructions (Signed)
 Continue medications as previously prescribed.  Return to the emergency department if you develop any new and/or concerning issues.

## 2024-07-03 ENCOUNTER — Other Ambulatory Visit: Payer: Self-pay

## 2024-07-03 ENCOUNTER — Encounter (HOSPITAL_COMMUNITY): Payer: Self-pay | Admitting: Surgery

## 2024-07-03 ENCOUNTER — Emergency Department (HOSPITAL_COMMUNITY)

## 2024-07-03 ENCOUNTER — Emergency Department (HOSPITAL_COMMUNITY): Admission: EM | Admit: 2024-07-03 | Discharge: 2024-07-03 | Disposition: A | Discharge status: Home / Self Care

## 2024-07-03 DIAGNOSIS — R1012 Left upper quadrant pain: Secondary | ICD-10-CM | POA: Diagnosis not present

## 2024-07-03 DIAGNOSIS — E119 Type 2 diabetes mellitus without complications: Secondary | ICD-10-CM | POA: Diagnosis not present

## 2024-07-03 DIAGNOSIS — Z79899 Other long term (current) drug therapy: Secondary | ICD-10-CM | POA: Diagnosis not present

## 2024-07-03 DIAGNOSIS — R1013 Epigastric pain: Secondary | ICD-10-CM | POA: Insufficient documentation

## 2024-07-03 DIAGNOSIS — R109 Unspecified abdominal pain: Secondary | ICD-10-CM

## 2024-07-03 DIAGNOSIS — Z7902 Long term (current) use of antithrombotics/antiplatelets: Secondary | ICD-10-CM | POA: Diagnosis not present

## 2024-07-03 DIAGNOSIS — Z794 Long term (current) use of insulin: Secondary | ICD-10-CM | POA: Insufficient documentation

## 2024-07-03 DIAGNOSIS — Z7984 Long term (current) use of oral hypoglycemic drugs: Secondary | ICD-10-CM | POA: Insufficient documentation

## 2024-07-03 DIAGNOSIS — I10 Essential (primary) hypertension: Secondary | ICD-10-CM | POA: Diagnosis not present

## 2024-07-03 DIAGNOSIS — R1010 Upper abdominal pain, unspecified: Secondary | ICD-10-CM | POA: Diagnosis present

## 2024-07-03 LAB — COMPREHENSIVE METABOLIC PANEL WITH GFR
ALT: 22 U/L (ref 0–44)
AST: 22 U/L (ref 15–41)
Albumin: 3.9 g/dL (ref 3.5–5.0)
Alkaline Phosphatase: 75 U/L (ref 38–126)
Anion gap: 12 (ref 5–15)
BUN: 13 mg/dL (ref 6–20)
CO2: 25 mmol/L (ref 22–32)
Calcium: 9.3 mg/dL (ref 8.9–10.3)
Chloride: 103 mmol/L (ref 98–111)
Creatinine, Ser: 0.83 mg/dL (ref 0.61–1.24)
GFR, Estimated: >60 mL/min (ref >60)
Glucose, Bld: 107 mg/dL — ABNORMAL HIGH (ref 70–99)
Potassium: 4 mmol/L (ref 3.5–5.1)
Sodium: 140 mmol/L (ref 135–145)
Total Bilirubin: 0.5 mg/dL (ref 0.0–1.2)
Total Protein: 7.7 g/dL (ref 6.5–8.1)

## 2024-07-03 LAB — URINALYSIS, ROUTINE W REFLEX MICROSCOPIC
Bacteria, UA: NONE SEEN
Bilirubin Urine: NEGATIVE
Glucose, UA: >=500 mg/dL — AB
Hgb urine dipstick: NEGATIVE
Ketones, ur: NEGATIVE mg/dL
Leukocytes,Ua: NEGATIVE
Nitrite: NEGATIVE
Protein, ur: NEGATIVE mg/dL
Specific Gravity, Urine: >1.046 — ABNORMAL HIGH (ref 1.005–1.030)
pH: 5 (ref 5.0–8.0)

## 2024-07-03 LAB — CBC
HCT: 44.7 % (ref 39.0–52.0)
Hemoglobin: 14.3 g/dL (ref 13.0–17.0)
MCH: 28.1 pg (ref 26.0–34.0)
MCHC: 32 g/dL (ref 30.0–36.0)
MCV: 87.8 fL (ref 80.0–100.0)
Platelets: 247 K/uL (ref 150–400)
RBC: 5.09 MIL/uL (ref 4.22–5.81)
RDW: 13.5 % (ref 11.5–15.5)
WBC: 6.9 K/uL (ref 4.0–10.5)
nRBC: 0 % (ref 0.0–0.2)

## 2024-07-03 LAB — LIPASE, BLOOD: Lipase: 34 U/L (ref 11–51)

## 2024-07-03 MED ORDER — IOHEXOL 300 MG/ML  SOLN
100.0000 mL | Freq: Once | INTRAMUSCULAR | Status: AC | PRN
  Administered 2024-07-03: 100 mL via INTRAVENOUS

## 2024-07-03 MED ORDER — ALUM & MAG HYDROXIDE-SIMETH 200-200-20 MG/5ML PO SUSP
30.0000 mL | Freq: Once | ORAL | Status: AC
  Administered 2024-07-03: 30 mL via ORAL
  Filled 2024-07-03: qty 30

## 2024-07-03 NOTE — Discharge Instructions (Signed)
 Your CT scan shows that the angle of the Lap-Band may be off slightly.  Please call Tuesday to schedule a follow-up appointment with your bariatric surgeon.  If you develop worsening symptoms over the weekend please go to [facility] as the only bariatric surgeons in the area normally work there.  Please return to the emergency department/go to [facility] if you develop fevers, worsening pain, or other concerning symptoms.

## 2024-07-03 NOTE — ED Notes (Signed)
 Pt reports a feeling of food becoming stuck in his esophagus at his gastric band. No associated nausea or vomiting. Abdominal pain does not increase.

## 2024-07-03 NOTE — ED Triage Notes (Signed)
 Pt c/o left abdominal pain starting two days ago. Pt denies n/v. Pt states one episode of loose stools. Pt state hx of urinary stricture disease.

## 2024-07-03 NOTE — ED Provider Notes (Signed)
 [place] EMERGENCY DEPARTMENT AT [facility] Provider Note   CSN: [id number] Arrival date & time: 07/03/24  1723     Patient presents with: Abdominal Pain   Angel Pope is a 48 y.o. male.   48 year old male with past medical history of diabetes, hypertension, BPH, and lap band in the past presenting to the emergency department today with upper abdominal pain.  The patient states that he was seen here few days ago or more for chest pain.  Reports the chest pain has since resolved.  He states that he does have been having some intermittent abdominal pain since then.  Reports that the pain is in the upper abdomen.  He denies any associated nausea or vomiting.  States that he does have some pain with eating.  He reports that he did have a loose stool yesterday.  Denies any associated fevers.   Abdominal Pain      Prior to Admission medications   Medication Sig Start Date End Date Taking? Authorizing Provider  atorvastatin  (LIPITOR) 20 MG tablet Take 1 tablet by mouth daily. 06/14/19   [provider]  betamethasone dipropionate 0.05 % cream Apply 1 application topically daily as needed (itching).  02/24/20   [provider]  chlorhexidine (PERIDEX) 0.12 % solution Use as directed 15 mLs in the mouth or throat daily.  03/28/20   [provider]  clopidogrel  (PLAVIX ) 75 MG tablet Take 1 tablet by mouth daily. 06/23/23   [provider]  clotrimazole-betamethasone (LOTRISONE) cream Apply 1 application topically daily as needed (itching).  02/23/20   [provider]  Continuous Glucose Sensor (DEXCOM G6 SENSOR) MISC DISPENSE AND USE AS DIRECTED 07/06/19   [provider]  ESZOPICLONE 3 MG tablet Take 3 mg by mouth at bedtime as needed (sleep). 03/23/20   [provider]  famotidine  (PEPCID ) 20 MG tablet Take 1 tablet (20 mg total) by mouth 2 (two) times daily. Patient taking differently: Take 20 mg by mouth 2 (two)  times daily as needed for heartburn or indigestion. 04/13/20   Desiderio Chew, PA-C  FARXIGA 10 MG TABS tablet Take 10 mg by mouth every evening.  08/09/18   [provider]  gabapentin  (NEURONTIN ) 300 MG capsule Take 600 mg by mouth 3 (three) times daily. 10/06/17   [provider]  ibuprofen  (ADVIL ) 200 MG tablet Take 600 mg by mouth every 6 (six) hours as needed for mild pain (pain score 1-3).    [provider]  insulin  aspart protamine- aspart (NOVOLOG  MIX 70/30) (70-30) 100 UNIT/ML injection Inject 16 Units into the skin in the morning, at noon, and at bedtime. Sliding Scale    [provider]  lamoTRIgine  (LAMICTAL ) 150 MG tablet Take 1 tablet (150 mg total) by mouth 2 (two) times daily. 06/13/24   Evonnie Lenis, MD  metFORMIN (GLUCOPHAGE-XR) 500 MG 24 hr tablet Take 500 mg by mouth 2 (two) times daily. 03/27/20   [provider]  nebivolol (BYSTOLIC) 2.5 MG tablet Take 2.5 mg by mouth daily.    [provider]  OLANZapine  (ZYPREXA ) 10 MG tablet Take 10 mg by mouth at bedtime as needed (sleep).    [provider]  ondansetron  (ZOFRAN ) 4 MG tablet Take 4 mg by mouth every 8 (eight) hours as needed for vomiting. 03/23/20   [provider]  oxyCODONE  (ROXICODONE ) 15 MG immediate release tablet Take 15 mg by mouth every 6 (six) hours as needed for pain.    [provider]  pantoprazole  (PROTONIX ) 20 MG tablet Take 20 mg by mouth daily as needed for heartburn or indigestion. 02/09/24   [provider]  TRESIBA  FLEXTOUCH 200 UNIT/ML SOPN Inject 100 Units into the skin every evening.  10/03/17   [provider]  vitamin B-12 (VITAMIN B12) 500 MCG tablet Take 1 tablet (500 mcg total) by mouth daily. 06/14/24   Evonnie Lenis, MD  WEGOVY 2.4 MG/0.75ML SOAJ SQ injection Inject 0.75 mLs into the skin every Friday. 05/10/24   [provider]    Allergies: Acetaminophen , Aspartame, Ciprofloxacin, Sulfa antibiotics,  Etodolac, Metronidazole, Phenylalanine, and Sulfamethoxazole-trimethoprim    Review of Systems  Gastrointestinal:  Positive for abdominal pain.  All other systems reviewed and are negative.   Updated Vital Signs BP 122/67   Pulse (!) 58   Temp 99.1 F (37.3 C) (Oral)   Resp 18   Ht 6' 4 (1.93 m)   Wt (!) 208.7 kg   SpO2 98%   BMI 55.99 kg/m   Physical Exam Vitals and nursing note reviewed.   Gen: NAD Eyes: PERRL, EOMI HEENT: no oropharyngeal swelling Neck: trachea midline Resp: clear to auscultation bilaterally Card: RRR, no murmurs, rubs, or gallops Abd: The patient is tender over the epigastrium and left upper quadrant with no guarding or rebound Extremities: no calf tenderness, no edema Vascular: 2+ radial pulses bilaterally, 2+ DP pulses bilaterally Skin: no rashes Psyc: acting appropriately   (all labs ordered are listed, but only abnormal results are displayed) Labs Reviewed  URINALYSIS, ROUTINE W REFLEX MICROSCOPIC - Abnormal; Notable for the following components:      Result Value   Specific Gravity, Urine >1.046 (*)    Glucose, UA >=500 (*)    All other components within normal limits  COMPREHENSIVE METABOLIC PANEL WITH GFR - Abnormal; Notable for the following components:   Glucose, Bld 107 (*)    All other components within normal limits  CBC  LIPASE, BLOOD    EKG: None  Radiology: CT ABDOMEN PELVIS W CONTRAST Result Date: 07/03/2024 CLINICAL DATA:  Abdominal pain, acute, nonlocalized Upper abd pain, hx lap band Pt reports a feeling of food becoming stuck in his esophagus at his gastric band. No associated nausea or vomiting. Abdominal pain does not increase. EXAM: CT ABDOMEN AND PELVIS WITH CONTRAST TECHNIQUE: Multidetector CT imaging of the abdomen and pelvis was performed using the standard protocol following bolus administration of intravenous contrast. RADIATION DOSE REDUCTION: This exam was performed according to the departmental  dose-optimization program which includes automated exposure control, adjustment of the mA and/or kV according to patient size and/or use of iterative reconstruction technique. CONTRAST:  OMNIPAQUE  IOHEXOL  300 MG/ML  SOLN COMPARISON:  CT abdomen pelvis 04/13/2020 FINDINGS: Lower chest: No acute abnormality. Hepatobiliary: No focal liver abnormality. No gallstones, gallbladder wall thickening, or pericholecystic fluid. No biliary dilatation. Pancreas: Diffusely atrophic. No focal lesion. Otherwise normal pancreatic contour. No surrounding inflammatory changes. No main pancreatic ductal dilatation. Spleen: Normal in size without focal abnormality. Adrenals/Urinary Tract: No adrenal nodule bilaterally. No nephrolithiasis and no hydronephrosis. No definite contour-deforming renal mass. No ureterolithiasis or hydroureter. The urinary bladder is unremarkable. Stomach/Bowel: Gastric lap band noted. Phi angle of 65 degrees. Stomach is within normal limits. No evidence of bowel wall thickening or dilatation. The appendix is not definitely identified with no inflammatory changes in the right lower quadrant to suggest acute appendicitis. Vascular/Lymphatic: No abdominal aorta or iliac aneurysm.No abdominal, pelvic, or inguinal lymphadenopathy. Reproductive: Prostate is unremarkable. Other:  No intraperitoneal free fluid. No intraperitoneal free gas. No organized fluid collection. Musculoskeletal: No abdominal wall hernia. Mild anterior abdominal subcutaneus soft tissue edema (2:91) No suspicious lytic or blastic osseous lesions. No acute displaced fracture. L5-S1 intervertebral disc space vacuum phenomenon. L3-L4 and L5-S1 posterior disc osteophyte complex formation. IMPRESSION: *No acute intra-abdominal or intrapelvic abnormality with limited evaluation on this noncontrast study. *Gastric lap band with phi angle of 65 degrees-query slippage. Electronically Signed   By: Morgane  Naveau M.D.   On: 07/03/2024 20:06      Procedures   Medications Ordered in the ED  alum & mag hydroxide-simeth (MAALOX/MYLANTA) 200-200-20 MG/5ML suspension 30 mL (30 mLs Oral Given 07/03/24 1822)  iohexol  (OMNIPAQUE ) 300 MG/ML solution 100 mL (100 mLs Intravenous Contrast Given 07/03/24 1943)                                    Medical Decision Making 48 year old male with past medical history of diabetes, hypertension, and BPH presenting to the emergency department today with abdominal pain.  I will further evaluate patient here with basic labs including LFTs and lipase to evaluate for hepatobiliary pathology or pancreatitis.  Will give him a GI cocktail here in the event this is due to GERD or gastritis.  Also obtain CT scan of his abdomen to evaluate for obstruction, diverticulitis, colitis, or hepatobiliary pathology.  I will reevaluate for ultimate disposition.  The patient's labs are reassuring.  The patient CT scan does show possible change in the angle of his Lap-Band.  I did call and discussed this with Dr.Pappylion who recommended p.o. challenging the patient.  She recommended giving the patient fluids and solid food to see how he did.  If he tolerated this she recommended having him follow-up with his bariatric surgeon.  The patient does have a bariatric surgeon that he follows up with.  He tolerated his p.o. challenge well did not have any vomiting or recurrent pain.  He is discharged with return precautions.  Amount and/or Complexity of Data Reviewed Labs: ordered. Radiology: ordered.  Risk OTC drugs. Prescription drug management.        Final diagnoses:  Abdominal pain, unspecified abdominal location    ED Discharge Orders     None          Ula Prentice SAUNDERS, MD 07/03/24 2217

## 2024-07-03 NOTE — ED Notes (Signed)
 Patient transported to CT

## 2024-07-03 NOTE — ED Notes (Signed)
 Patient provided sandwich and drink for PO challenge. Patient states he was able to eat and drink okay. ER MD notified

## 2024-07-16 ENCOUNTER — Encounter (HOSPITAL_COMMUNITY): Payer: Self-pay

## 2024-07-16 ENCOUNTER — Emergency Department (HOSPITAL_COMMUNITY)
Admission: EM | Admit: 2024-07-16 | Discharge: 2024-07-16 | Disposition: A | Discharge status: Home / Self Care | Attending: Emergency Medicine | Admitting: Emergency Medicine

## 2024-07-16 ENCOUNTER — Emergency Department (HOSPITAL_COMMUNITY)

## 2024-07-16 DIAGNOSIS — E1165 Type 2 diabetes mellitus with hyperglycemia: Secondary | ICD-10-CM | POA: Diagnosis not present

## 2024-07-16 DIAGNOSIS — Z79899 Other long term (current) drug therapy: Secondary | ICD-10-CM | POA: Insufficient documentation

## 2024-07-16 DIAGNOSIS — Z7902 Long term (current) use of antithrombotics/antiplatelets: Secondary | ICD-10-CM | POA: Insufficient documentation

## 2024-07-16 DIAGNOSIS — N3001 Acute cystitis with hematuria: Secondary | ICD-10-CM | POA: Diagnosis not present

## 2024-07-16 DIAGNOSIS — Z794 Long term (current) use of insulin: Secondary | ICD-10-CM | POA: Insufficient documentation

## 2024-07-16 DIAGNOSIS — Z7984 Long term (current) use of oral hypoglycemic drugs: Secondary | ICD-10-CM | POA: Diagnosis not present

## 2024-07-16 DIAGNOSIS — R82998 Other abnormal findings in urine: Secondary | ICD-10-CM | POA: Diagnosis present

## 2024-07-16 LAB — URINALYSIS, W/ REFLEX TO CULTURE (INFECTION SUSPECTED)
Bacteria, UA: NONE SEEN
Bilirubin Urine: NEGATIVE
Glucose, UA: >=500 mg/dL — AB
Ketones, ur: NEGATIVE mg/dL
Leukocytes,Ua: NEGATIVE
Nitrite: NEGATIVE
Protein, ur: NEGATIVE mg/dL
RBC / HPF: >50 RBC/hpf (ref 0–5)
Specific Gravity, Urine: 1.031 — ABNORMAL HIGH (ref 1.005–1.030)
pH: 6 (ref 5.0–8.0)

## 2024-07-16 LAB — CBC WITH DIFFERENTIAL/PLATELET
Abs Immature Granulocytes: 0.02 K/uL (ref 0.00–0.07)
Basophils Absolute: 0 K/uL (ref 0.0–0.1)
Basophils Relative: 0 %
Eosinophils Absolute: 0 K/uL (ref 0.0–0.5)
Eosinophils Relative: 0 %
HCT: 44.2 % (ref 39.0–52.0)
Hemoglobin: 14.7 g/dL (ref 13.0–17.0)
Immature Granulocytes: 0 %
Lymphocytes Relative: 28 %
Lymphs Abs: 2.1 K/uL (ref 0.7–4.0)
MCH: 28.3 pg (ref 26.0–34.0)
MCHC: 33.3 g/dL (ref 30.0–36.0)
MCV: 85.2 fL (ref 80.0–100.0)
Monocytes Absolute: 0.4 K/uL (ref 0.1–1.0)
Monocytes Relative: 6 %
Neutro Abs: 4.9 K/uL (ref 1.7–7.7)
Neutrophils Relative %: 66 %
Platelets: 225 K/uL (ref 150–400)
RBC: 5.19 MIL/uL (ref 4.22–5.81)
RDW: 13.5 % (ref 11.5–15.5)
WBC: 7.4 K/uL (ref 4.0–10.5)
nRBC: 0 % (ref 0.0–0.2)

## 2024-07-16 LAB — COMPREHENSIVE METABOLIC PANEL WITH GFR
ALT: 23 U/L (ref 0–44)
AST: 23 U/L (ref 15–41)
Albumin: 3.6 g/dL (ref 3.5–5.0)
Alkaline Phosphatase: 73 U/L (ref 38–126)
Anion gap: 13 (ref 5–15)
BUN: 16 mg/dL (ref 6–20)
CO2: 24 mmol/L (ref 22–32)
Calcium: 9 mg/dL (ref 8.9–10.3)
Chloride: 99 mmol/L (ref 98–111)
Creatinine, Ser: 0.91 mg/dL (ref 0.61–1.24)
GFR, Estimated: >60 mL/min (ref >60)
Glucose, Bld: 255 mg/dL — ABNORMAL HIGH (ref 70–99)
Potassium: 4 mmol/L (ref 3.5–5.1)
Sodium: 136 mmol/L (ref 135–145)
Total Bilirubin: 0.6 mg/dL (ref 0.0–1.2)
Total Protein: 7.4 g/dL (ref 6.5–8.1)

## 2024-07-16 MED ORDER — CEPHALEXIN 500 MG PO CAPS: 500.0000 mg | ORAL_CAPSULE | Freq: Four times a day (QID) | ORAL | 0 refills | Status: AC

## 2024-07-16 NOTE — Discharge Instructions (Addendum)
 It was a pleasure taking care of you today.  You were seen for dark-colored urine with strong odor today.  Your workup was overall reassuring, your blood sugar was slightly high at 255 but your urine did show large amount of blood and some white blood cells which can be a sign of infection.  I am sending your urine for culture to see if this is a true infection, but in the meantime I am going to start you on an antibiotic.  Please call your urologist and/or your PCP to check your culture results in 2 to 3 days.  If there is no bacterial growth they may want to stop your antibiotics.

## 2024-07-16 NOTE — ED Provider Notes (Signed)
 [place] EMERGENCY DEPARTMENT AT [facility] Provider Note   CSN: [id number] Arrival date & time: 07/16/24  1134     Patient presents with: dark urine   Angel Pope is a 48 y.o. male.  He has history of urethral strictures.  He has a suprapubic catheter that was placed 3 days ago.  He states they are having this placed until he can have surgery to repair their strictures in a couple of months.  He has a history of a morbid obesity and type 2 diabetes.  Presents to the ER today complaining of dark urine that smelled like ammonia this morning and he states now it is back to a normal color and odor.  Denies any fever chills or pain aside from some mild tenderness at the incision site that has been unchanged.  Denies any drainage from the incision site.  States he had previously had some blood in his urine but was told by his urologist that that was to be expected after the procedure.  He has not seen any blood today.  {Add pertinent medical, surgical, social history, OB history to HPI:32947} HPI     Prior to Admission medications   Medication Sig Start Date End Date Taking? Authorizing Provider  atorvastatin  (LIPITOR) 20 MG tablet Take 1 tablet by mouth daily. 06/14/19   [provider]  betamethasone dipropionate 0.05 % cream Apply 1 application topically daily as needed (itching).  02/24/20   [provider]  chlorhexidine (PERIDEX) 0.12 % solution Use as directed 15 mLs in the mouth or throat daily.  03/28/20   [provider]  clopidogrel  (PLAVIX ) 75 MG tablet Take 1 tablet by mouth daily. 06/23/23   [provider]  clotrimazole-betamethasone (LOTRISONE) cream Apply 1 application topically daily as needed (itching).  02/23/20   [provider]  Continuous Glucose Sensor (DEXCOM G6 SENSOR) MISC DISPENSE AND USE AS DIRECTED 07/06/19   [provider]  ESZOPICLONE 3 MG tablet Take 3 mg by mouth at bedtime as needed (sleep).  03/23/20   [provider]  famotidine  (PEPCID ) 20 MG tablet Take 1 tablet (20 mg total) by mouth 2 (two) times daily. Patient taking differently: Take 20 mg by mouth 2 (two) times daily as needed for heartburn or indigestion. 04/13/20   Desiderio Chew, PA-C  FARXIGA 10 MG TABS tablet Take 10 mg by mouth every evening.  08/09/18   [provider]  gabapentin  (NEURONTIN ) 300 MG capsule Take 600 mg by mouth 3 (three) times daily. 10/06/17   [provider]  ibuprofen  (ADVIL ) 200 MG tablet Take 600 mg by mouth every 6 (six) hours as needed for mild pain (pain score 1-3).    [provider]  insulin  aspart protamine- aspart (NOVOLOG  MIX 70/30) (70-30) 100 UNIT/ML injection Inject 16 Units into the skin in the morning, at noon, and at bedtime. Sliding Scale    [provider]  lamoTRIgine  (LAMICTAL ) 150 MG tablet Take 1 tablet (150 mg total) by mouth 2 (two) times daily. 06/13/24   Evonnie Lenis, MD  metFORMIN (GLUCOPHAGE-XR) 500 MG 24 hr tablet Take 500 mg by mouth 2 (two) times daily. 03/27/20   [provider]  nebivolol (BYSTOLIC) 2.5 MG tablet Take 2.5 mg by mouth daily.    [provider]  OLANZapine  (ZYPREXA ) 10 MG tablet Take 10 mg by mouth at bedtime as needed (sleep).    [provider]  ondansetron  (ZOFRAN ) 4 MG tablet Take 4 mg by  mouth every 8 (eight) hours as needed for vomiting. 03/23/20   [provider]  oxyCODONE  (ROXICODONE ) 15 MG immediate release tablet Take 15 mg by mouth every 6 (six) hours as needed for pain.    [provider]  pantoprazole  (PROTONIX ) 20 MG tablet Take 20 mg by mouth daily as needed for heartburn or indigestion. 02/09/24   [provider]  TRESIBA  FLEXTOUCH 200 UNIT/ML SOPN Inject 100 Units into the skin every evening.  10/03/17   [provider]  vitamin B-12 (VITAMIN B12) 500 MCG tablet Take 1 tablet (500 mcg total) by mouth daily. 06/14/24   Evonnie Lenis, MD  WEGOVY 2.4  MG/0.75ML SOAJ SQ injection Inject 0.75 mLs into the skin every Friday. 05/10/24   [provider]    Allergies: Acetaminophen , Aspartame, Ciprofloxacin, Sulfa antibiotics, Etodolac, Metronidazole, Phenylalanine, and Sulfamethoxazole-trimethoprim    Review of Systems  Updated Vital Signs BP (!) 150/70   Pulse 95   Temp 98.3 F (36.8 C)   Resp 18   Ht 6' 4 (1.93 m)   Wt (!) 204.6 kg   SpO2 99%   BMI 54.90 kg/m   Physical Exam Vitals and nursing note reviewed.  Constitutional:      General: He is not in acute distress.    Appearance: He is well-developed.  HENT:     Head: Normocephalic and atraumatic.  Eyes:     Conjunctiva/sclera: Conjunctivae normal.  Cardiovascular:     Rate and Rhythm: Normal rate and regular rhythm.     Heart sounds: No murmur heard. Pulmonary:     Effort: Pulmonary effort is normal. No respiratory distress.     Breath sounds: Normal breath sounds.  Abdominal:     Palpations: Abdomen is soft.     Tenderness: There is no abdominal tenderness.     Comments: Suprapubic catheter site without signs of infection.  Musculoskeletal:        General: No swelling.     Cervical back: Neck supple.  Skin:    General: Skin is warm and dry.     Capillary Refill: Capillary refill takes less than 2 seconds.  Neurological:     General: No focal deficit present.     Mental Status: He is alert and oriented to person, place, and time.  Psychiatric:        Mood and Affect: Mood normal.     (all labs ordered are listed, but only abnormal results are displayed) Labs Reviewed  URINALYSIS, W/ REFLEX TO CULTURE (INFECTION SUSPECTED) - Abnormal; Notable for the following components:      Result Value   Specific Gravity, Urine 1.031 (*)    Glucose, UA >=500 (*)    Hgb urine dipstick LARGE (*)    All other components within normal limits  COMPREHENSIVE METABOLIC PANEL WITH GFR - Abnormal; Notable for the following components:   Glucose, Bld 255 (*)    All  other components within normal limits  CBC WITH DIFFERENTIAL/PLATELET    EKG: None  Radiology: No results found.  {Document cardiac monitor, telemetry assessment procedure when appropriate:32947} Procedures   Medications Ordered in the ED - No data to display    {Click here for ABCD2, HEART and other calculators REFRESH Note before signing:1}                              Medical Decision Making Amount and/or Complexity of Data Reviewed Labs: ordered. Radiology: ordered.   ***  {  Document critical care time when appropriate  Document review of labs and clinical decision tools ie CHADS2VASC2, etc  Document your independent review of radiology images and any outside records  Document your discussion with family members, caretakers and with consultants  Document social determinants of health affecting pt's care  Document your decision making why or why not admission, treatments were needed:32947:::1}   Final diagnoses:  None    ED Discharge Orders     None

## 2024-07-16 NOTE — ED Notes (Signed)
 New urinary bag was placed before u/a was obtained.

## 2024-07-16 NOTE — ED Triage Notes (Signed)
 Pt comes in for dark urine. Pt noticed this change this morning. Pt started to smell something like ammonis this morning but went away after a few seconds. Pt denies any output changes.  Pt has recent surgery (9/9) for Imaging guided suprapubic catheter placement at [facility].   Pt just started Depakote (9/3) for anxiety.    A&Ox4.

## 2024-07-18 LAB — URINE CULTURE: Culture: 60000 — AB

## 2024-07-19 ENCOUNTER — Telehealth (HOSPITAL_BASED_OUTPATIENT_CLINIC_OR_DEPARTMENT_OTHER): Payer: Self-pay | Admitting: *Deleted

## 2024-07-19 NOTE — Progress Notes (Signed)
 ED Antimicrobial Stewardship Positive Culture Follow Up   Angel Pope is an 48 y.o. male who presented to [facility] on 07/16/2024 with a chief complaint of  Chief Complaint  Patient presents with   dark urine    Recent Results (from the past 720 hours)  Urine Culture     Status: Abnormal   Collection Time: 07/16/24 12:55 PM   Specimen: Urine, Clean Catch  Result Value Ref Range Status   Specimen Description   Final    URINE, CLEAN CATCH Performed at [facility], [address] [place], [place] [address]    Special Requests   Final    NONE Performed at [facility], [address], [place], [place] [address]    Culture 60,000 COLONIES/mL ENTEROCOCCUS FAECALIS (A)  Final   Report Status 07/18/2024 FINAL  Final   Organism ID, Bacteria ENTEROCOCCUS FAECALIS (A)  Final      Susceptibility   Enterococcus faecalis - MIC*    AMPICILLIN <=2 SENSITIVE Sensitive     NITROFURANTOIN <=16 SENSITIVE Sensitive     VANCOMYCIN 1 SENSITIVE Sensitive     * 60,000 COLONIES/mL ENTEROCOCCUS FAECALIS    [x]  Treated with cephalexin , organism resistant to prescribed antimicrobial []  Patient discharged originally without antimicrobial agent and treatment is now indicated  New antibiotic prescription: nitrofurantoin 100mg  BID x 7 days  ED Provider: Sherlean Carota, PA-C   Angel Pope 07/19/2024, 8:44 AM Clinical Pharmacist Monday - Friday phone -  [phone] Saturday - Sunday phone - [phone]

## 2024-07-19 NOTE — Telephone Encounter (Signed)
 Post ED Visit - Positive Culture Follow-up: Successful Patient Follow-Up  Culture assessed and recommendations reviewed by:  [x]  Vito Ralph, Pharm.D. []  Venetia Gully, Pharm.D., BCPS AQ-ID []  Garrel Crews, Pharm.D., BCPS []  Almarie Lunger, Pharm.D., BCPS []  [place], [address].D., BCPS, AAHIVP []  Rosaline Bihari, Pharm.D., BCPS, AAHIVP []  Vernell Meier, PharmD, BCPS []  Latanya Hint, PharmD, BCPS []  Donald Medley, PharmD, BCPS []  Rocky Bold, PharmD  Positive urine culture  []  Patient discharged without antimicrobial prescription and treatment is now indicated [x]  Organism is resistant to prescribed ED discharge antimicrobial []  Patient with positive blood cultures  Changes discussed with ED provider: Sherlean Carota, PA-C New antibiotic prescription: Nitrofurantoin 100mg  po BID x 7 days Called to [facility], [address], [place], [place]  Contacted patient, date 07/19/24, time 0945   Angel Pope 07/19/2024, 9:50 AM

## 2024-07-20 NOTE — Progress Notes (Signed)
 History of Present Illness This is a 48 y.o. male who is seen for overdue follow-up of type 2 diabetes, hypogonadism, and morbid obesity.  I saw him as a new patient in May 2020, and he has had very inconsistent follow-up since then.  I most recently saw him for a follow-up visit in September 2024.   Angel Pope was diagnosed with DM sometime around 2014.  His A1c was up to 10.6 as of April 2021.  Over the last couple years, A1c was 9.6 in March 2023, 6.4 in September 2023, 6.9 in March 2024, and 7.3 in July 2024.  His A1c was down to 5.9 at the time of a recent hospitalization at [facility] in August 2025.    He was hospitalized last month because of confusion, tremor, nausea, and acute kidney injury.  His symptoms were attributed to lithium  toxicity.  The lithium  was discontinued at the time of discharge, and his renal function improved with IV fluids.  He is now on Depakote in place of the lithium .  He also has been struggling during the last few months with difficulty urinating, related to a urethral stricture.  He now has a suprapubic catheter in place until he can be scheduled for urethroplasty for the stricture.  He reports that several of his medications were changed at the time of the hospitalization in August.  For diabetes, he is now taking Tresiba  80 units daily (down from 120 units daily), NovoLog  16 units 3 times daily before meals, Farxiga 10 mg daily and metformin ER 1000 mg daily.  He is also on Wegovy currently, but planning to switch to Mounjaro 5 mg next week, prescribed by his bariatric provider.  We uploaded his Dexcom CGM data for review today.  Since reducing his insulin  dosing last month, his blood sugars have been considerably higher.  Overall CGM average for the last 2 weeks is 223 (GMI 8.6).  Glucose has been in target range (70-180) only 25% of the time, and above target 75%.  Fasting average is around 200.  No hypoglycemia.  He did lose about 25 to 30 pounds earlier this year.   He is trying to get his weight down into a range that would qualify for bariatric surgery.  He has a past history of LAP-BAND surgery in 2010, but had complications thereafter and regained a significant amount of weight after the procedure.  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  His diabetes is complicated by peripheral neuropathy, for which he is taking gabapentin .  (Some of his lower extremity symptoms are also presumed to be related to lumbar degenerative disc disease.)  He has also had proteinuric nephropathy.  No history of diabetic retinopathy.    Angel Pope also was diagnosed with low testosterone sometime around 2015.  He has been on chronic long-acting opioids for management of his back pain, and is currently using a fentanyl  patch and oxycodone  PRN.  His pain medications are prescribed through an [facility] pain management clinic.  He has tried AndroGel and Androderm in the past, with poor results.  He was on testosterone cypionate injections a few years ago.  I started him on Xyosted in 2021, but his insurance stopped covering the [place], so he has been back on testosterone cypionate injections.  His dosing most recently was 150 mg IM every 14 days, but he says that he was also instructed to stop the testosterone injections when he was in the hospital last month, so has not  had an injection since then.  He does feel more fatigued in general since he has been off the testosterone.   Most recent retinal exam:    2023-04-29(scheduled for exam later this month) Most recent LDL cholesterol:    50 (05/29/23)  Taking statin?:      NO Most recent urine microalbumin:   21.1 mg/g (01/29/23)  Taking ACEI/ARB?:      YES (lisinopril 20 mg) Taking daily ASA?:       YES (81 mg; also on Plavix ) Current smoker?:       NO Discussed smoking cessation?:   N/A    PAST MEDICAL HISTORY Past Medical History:  Diagnosis Date  . Abnormal stress test    PT PLACED ON PLAVIX   PREVENTATIVELY, CARDS WORKUP WITH NO FURTHER INTERVENTION  . Anxiety   . Diabetes mellitus (*)    TYPE 2 FSBS 150's  . Headache 02/18/2014  . Hypertension    SEE'S CARDS  . Kidney stone 28-Apr-2024   has passed.  . Neuropathy   . Palpitations   . Pseudotumor cerebri   . Right shoulder pain 09/25/2015  . Severe obesity (BMI >= 40) (*)    BMI 56.48  . Sleep apnea    CPAP NIGHTLY USE  . Urinary anastomotic stricture     PAST SURGICAL HISTORY Past Surgical History:  Procedure Laterality Date  . Anal sphincterotomy    . Appendectomy    . Carpal tunnel release Right    WRIST  . Cervical discectomy N/A    c5, c6 plate inside  . Colonoscopy  2012  . Cystoscopy w/ internal urethrotomy  12/11/2022   OPTILUME BALLOON DILATION URETHRA  . Elbow surgery Bilateral   . Laparoscopic gastric banding  2010  . Shoulder surgery Right 05/05/2019  . Spine surgery  2016   Spinal fusion at  C5-C6 WITH PLATE  . Upper gastrointestinal endoscopy  2012     MEDICATIONS Current Outpatient Medications  Medication Sig Dispense Refill  . amLODIPine besylate (NORVASC) 5 mg tablet Take one and a half tablets (7.5 mg dose) by mouth daily. Please hold amlodipine if systolic blood pressure less than 120. 45 tablet 5  . atorvastatin  (LIPITOR) 20 mg tablet TAKE 1 TABLET BY MOUTH EVERY DAY 90 tablet 1  . BD PEN NEEDLE MINI U/F 31G X 5 MM MISC For Tresiba  FlexTouch and NovoLog  FlexPen pen injections.  Four injections daily. 400 each PRN  . clonazePAM (KLONOPIN) 1 mg tablet Take 0.5-1 tablets by mouth daily as needed for Anxiety (PRN ANXIETY).    . clopidogrel  bisulfate (PLAVIX ) 75 mg tablet TAKE 1 TABLET BY MOUTH EVERY DAY 90 tablet 1  . clotrimazole-betamethasone (LOTRISONE) cream Apply topically 2 (two) times daily. 30 g 3  . Continuous Glucose Sensor (DEXCOM G7 SENSOR) MISC Inject 1 each into the skin every 10 days. 9 each 1  . dapagliflozin (FARXIGA) 10 mg tablet TAKE 1 TABLET BY MOUTH DAILY 90 tablet 0  .  divalproex sodium (DEPAKOTE ER) 500 mg 24 hr tablet Take one tablet (500 mg dose) by mouth daily.    . famotidine  (PEPCID ) 20 mg tablet Take one tablet (20 mg dose) by mouth at bedtime as needed.    . fentaNYL  (DURAGESIC ) 50 mcg/hour one patch every 3 (three) days.    . fluocinolone acetonide 0.01% OIL otic oil SMARTSIG:5 Drop(s) In Ear(s) Once a Week PRN    . gabapentin  (NEURONTIN ) 300 mg capsule TAKE ONE CAPSULE BY MOUTH 3 TIMES A  DAY. 90 capsule 2  . Glucagon, rDNA, (GLUCAGON EMERGENCY) 1 MG KIT Inject 1 mg into the skin as directed in the event of severe hypoglycemia. 1 kit 1  . hydrOXYzine  HCl (ATARAX ) 50 mg tablet Take one tablet (50 mg dose) by mouth every 4 (four) hours as needed for Anxiety.    . ibuprofen  (ADVIL ,MOTRIN ) 200 mg tablet Take two tablets (400 mg dose) by mouth every 6 (six) hours as needed for Pain (PRN PAIN).    . insulin  aspart (NOVOLOG  FLEXPEN) 100 unit/mL injection Inject thirty Units into the skin 15 (fifteen) minutes before meals. 90 mL 1  . Insulin  Degludec (TRESIBA  FLEXTOUCH) 200 UNIT/ML injection Inject one hundred twenty Units into the skin daily. 60 mL 1  . lamotrigine  (LAMICTAL ) 200 MG tablet Take one tablet (200 mg dose) by mouth at bedtime. QHS    . LUNESTA 3 MG tablet SMARTSIG:0.5-1 Tablet(s) By Mouth Every Night PRN    . metFORMIN ER (GLUCOPHAGE-XR) 500 mg 24 hr tablet TAKE 2 TABLETS BY MOUTH EVERY DAY WITH BREAKFAST 180 tablet 1  . miconazole (ANTI-FUNGAL) 2 % cream Apply topically 2 (two) times daily. 28.35 g 0  . Misc. Devices MISC Patient needs replacement hose and filters Send to DME - [employer]oflow 1 each 0  . Misc. Devices MISC Patient needs replacement head gear for CPAP 1 each 0  . Naloxone HCl 4 MG/0.1ML LIQD nasal spray one spray as needed.    . nebivolol (BYSTOLIC) 2.5 mg tablet Take two tablets (5 mg dose) by mouth every 12 (twelve) hours. Please, hold Nebivolol if systolic BP is less than 105 or HR is less than 60. 90 tablet 3  . nitrofurantoin ,  macrocrystal-monohydrate, (MACROBID ) 100 mg capsule Take one capsule (100 mg dose) by mouth 2 (two) times daily.    SABRA nystatin (MYCOSTATIN) powder APPLY TO AFFECTED AREA 3 TIMES A DAY 60 g 5  . OLANZapine  (ZYPREXA ) 10 mg tablet Take one tablet (10 mg dose) by mouth at bedtime. QHS    . ondansetron  (ZOFRAN -ODT) 4 mg disintegrating tablet Take one tablet (4 mg dose) by mouth daily as needed.    . oxyCODONE  HCl (ROXICODONE ) 15 mg immediate release tablet Take one tablet (15 mg dose) by mouth every 6 (six) hours as needed for Pain.    . pantoprazole  sodium (PROTONIX ) 40 mg tablet Take one tablet (40 mg dose) by mouth daily. 30 tablet 2  . terbinafine (LAMISIL,SM ATHLETES FOOT) 1% cream Apply to affected area 2 times daily for 4-weeks    . testosterone cypionate (DEPOTESTOTERONE CYPIONATE) 200 MG/ML injection INJECT 0.75 MLS (150 MG DOSE) INTO THE MUSCLE EVERY 14 (FOURTEEN) DAYS. MAX DAILY AMOUNT: 150 MG 2 mL 2  . tirzepatide (MOUNJARO) 5 mg/0.5 mL SOAJ pen injection Inject 0.5 mLs (5 mg dose) into the skin once a week. 2 mL 1  . tiZANidine  (ZANAFLEX ) 4 mg tablet Take by mouth.    SABRA UNABLE TO FIND CPAP machine     Current Facility-Administered Medications  Medication Dose Route Frequency Provider Last Rate Last Admin  . cephALEXin  (KEFLEX ) capsule 500 mg  500 mg Oral ONCE       . cephALEXin  (KEFLEX ) capsule 500 mg  500 mg Oral ONCE       . lidocaine  HCl (XYLOCAINE ) 2% uro-jet gel 5 mL  5 mL Urethral ONCE       . lidocaine  HCl (XYLOCAINE ) 2% uro-jet gel 5 mL  5 mL Urethral ONCE       .  triamcinolone acetonide (KENALOG-40) 40 mg/mL injection 40 mg  40 mg Intra-articular Once PRN    40 mg at 04/02/24 1118    ALLERGIES Allergies  Allergen Reactions  . Ciprofloxacin Unknown and Hives  . Etodolac Other    Caused legs to ache Caused legs to ache  Caused legs to ache  Caused legs to ache  . Metronidazole Nausea And Vomiting  . Sulfa Antibiotics Hives  . Tylenol  [Acetaminophen ] Hives and Nausea  Only  . Aspartame Nausea Only      Physical Examination Vitals:   07/20/24 1038  BP: 136/78  BP Location: Left Lower Arm  Patient Position: Sitting   There is no height or weight on file to calculate BMI.  GENERAL: Pleasant, morbidly obese male in no distress.  Alert and oriented.  Appropriate affect and conversation.   EYES: Pupils equal, round, and reactive to light.  Extraocular movements intact.  Funduscopic exam deferred.  HEENT: Mucus membranes are moist with no oropharyngeal lesions.  NECK: Supple with no lymphadenopathy.  Thyroid normal in size, with no palpable nodules. CARDIOVASCULAR: Normal rate and regular rhythm with no murmurs, rubs or gallops. 2+ distal pulses. GI:  Large abdominal pannus.  Soft, nontender. EXTREMITIES: Warm and well-perfused. No clubbing, cyanosis or edema. NEUROLOGIC: Alert and oriented x3. Gait and station normal.  SKIN: Warm and dry with no rashes.  + acanthosis nigricans.   Diabetic foot exam:   Deferred.     Laboratory Data: 05/18/19 HbA1c  7.5 Na 136, K 4.6, Cl 99, CO2 21, BUN 15, Cr 0.70, Glu 284 AST 17, ALT 21, ALP 80, Tbil 0.2 TC 181, TG 75, HDL 51, LDL 115 Total testo 649 Free testo 6.3 (L) WBC 12.3, Hgb 15.1, Hct 46.3, PLT 320 Urine micral/Cr ratio 54 mg/g  07/16/19 Na 137, K 4.6, Cl 99, CO2 26, BUN 15, Cr 0.83, Glu 135 AST 16, ALT 17, ALP 80, Tbil 0.2  09/29/19 HbA1c  7.3  03/01/20 HbA1c  10.6 TC 181, TG 122, HDL 62, LDL 98 Urine micral/Cr ratio 53 mg/g (H)  10/18/20 HbA1c  8.7 Na 141, K 4.5, Cl 100, CO2 26, BUN 16, Cr 0.96, Glu 87 AST 20, ALT 23, ALP 84, Tbil 0.4  01/30/22 HbA1c  9.6 Na 138, K 4.2, Cl 101, CO2 20, BUN 10, Cr 0.81, Glu 300 AST 20, ALT 21, ALP 94, Tbil 0.2 TC 133, TG 99, HDL 53, LDL 62 TSH  1.02 FT4  1.36  03/13/22 Urine micral <5 mg/L  08/01/22 HbA1c  6.4 Total testo 605 Free testo 5.2 (L)  01/29/23 HbA1c  6.9 Urine micral/Cr ratio 21.1 mg/g  05/29/23 HbA1c  7.3 Na 136, K 4.9, Cl 99,  CO2 23, BUN 20, Cr 0.99, Glu 167 AST 22, ALT 17, ALP 73, Tbil 0.7 TC 117, TG 123, HDL 45, LDL 50  06/09/24 HbA1c  5.9  07/07/24 Na 140, K 4.7, Cl 102, CO2 19, BUN 17, Cr 0.83, Glu 163    Impression Encounter Diagnoses  Name Primary?  SABRA Uncontrolled type 2 diabetes mellitus with hyperglycemia (*) Yes  . Type 2 diabetes mellitus with diabetic neuropathy, with long-term current use of insulin  (*)   . Hypogonadism male   . Morbid obesity with BMI of 50.0-59.9, adult (*)   . Hypertension associated with diabetes (*)       This is a 48 y.o. male with type 2 diabetes, morbid obesity, and hypogonadism.  His low testosterone is presumably related to a combination of morbid obesity  and chronic opioid use.  He stopped his testosterone injections after he was in the hospital last month.  We will recheck his testosterone today, and plan to resume therapy, assuming his testosterone is still low.  He also has a history of type 2 diabetes, poorly controlled in the past and complicated by peripheral neuropathy and proteinuric nephropathy.  His control has been much better over most of the last couple years, and his A1c was down to 5.9 in the hospital last month.  However, he was instructed to reduce his insulin  dosing significantly after that hospitalization, and his CGM looks much worse during the last few weeks.  I would recommend increasing his basal and prandial insulin  dosing.  He also is planning to switch from [facility] to Mounjaro next week, at the recommendation of his bariatric provider.      Recommendations 1) Increase Tresiba  to 100 units once daily.   2) Increase NovoLog  to 20 units before each meal, 3 times daily.   3) Continue Farxiga 10 mg daily and metformin ER 1000 mg twice daily. 4) Agree with starting Mounjaro 5 mg weekly, and can titrate up dosage as tolerated. 5) Will check CMP, lipid panel, total testosterone, and CBC today.   6) Continue using Dexcom G7 CGM at all times.  I am  happy to review his CGM data anytime between now and next visit.   7) Keep upcoming appointment for diabetic eye exam. 8) Return for follow-up office visit with me in 3 months.  Emphasized the importance of keeping his appointments as scheduled.

## 2024-07-30 ENCOUNTER — Emergency Department (HOSPITAL_COMMUNITY)
Admission: EM | Admit: 2024-07-30 | Discharge: 2024-07-30 | Disposition: A | Discharge status: Home / Self Care | Attending: Emergency Medicine | Admitting: Emergency Medicine

## 2024-07-30 ENCOUNTER — Other Ambulatory Visit: Payer: Self-pay

## 2024-07-30 ENCOUNTER — Encounter (HOSPITAL_COMMUNITY): Payer: Self-pay

## 2024-07-30 DIAGNOSIS — E119 Type 2 diabetes mellitus without complications: Secondary | ICD-10-CM | POA: Diagnosis not present

## 2024-07-30 DIAGNOSIS — Z7984 Long term (current) use of oral hypoglycemic drugs: Secondary | ICD-10-CM | POA: Diagnosis not present

## 2024-07-30 DIAGNOSIS — Z7901 Long term (current) use of anticoagulants: Secondary | ICD-10-CM | POA: Insufficient documentation

## 2024-07-30 DIAGNOSIS — Z79899 Other long term (current) drug therapy: Secondary | ICD-10-CM | POA: Insufficient documentation

## 2024-07-30 DIAGNOSIS — Z794 Long term (current) use of insulin: Secondary | ICD-10-CM | POA: Diagnosis not present

## 2024-07-30 DIAGNOSIS — R319 Hematuria, unspecified: Secondary | ICD-10-CM | POA: Diagnosis present

## 2024-07-30 DIAGNOSIS — I10 Essential (primary) hypertension: Secondary | ICD-10-CM | POA: Insufficient documentation

## 2024-07-30 LAB — URINALYSIS, ROUTINE W REFLEX MICROSCOPIC
Bacteria, UA: NONE SEEN
Bilirubin Urine: NEGATIVE
Glucose, UA: >=500 mg/dL — AB
Ketones, ur: 5 mg/dL — AB
Leukocytes,Ua: NEGATIVE
Nitrite: POSITIVE — AB
Protein, ur: 30 mg/dL — AB
RBC / HPF: >50 RBC/hpf (ref 0–5)
Specific Gravity, Urine: 1.04 — ABNORMAL HIGH (ref 1.005–1.030)
WBC, UA: >50 WBC/hpf (ref 0–5)
pH: 5 (ref 5.0–8.0)

## 2024-07-30 MED ORDER — NITROFURANTOIN MONOHYD MACRO 100 MG PO CAPS: 100.0000 mg | ORAL_CAPSULE | Freq: Two times a day (BID) | ORAL | 0 refills | Status: AC

## 2024-07-30 NOTE — Discharge Instructions (Signed)
 We evaluated you for your urine symptoms.  Your testing in the emergency department was reassuring.  Your urine does have red blood cells.  This may be due to irritation of your bladder or an infection.  We have given you a course of oral antibiotics.  Please follow-up with your urologist and primary physician.  If you develop any symptoms like fevers or chills, lightheadedness or dizziness, abdominal pain, or your catheter stops draining well, please return to the emergency department.

## 2024-07-30 NOTE — ED Triage Notes (Signed)
 Pt to er, pt states that he had a foley cath placed on the 16th, states that it was placed for a urinary stricture, states that he is here today for some blood, clots and brown urine in his foley bag.  Pt states that he first noticed it last night, pt states that he does not feel poorly

## 2024-07-30 NOTE — ED Provider Notes (Signed)
 [place] EMERGENCY DEPARTMENT AT [facility] Provider Note  CSN: [id number] Arrival date & time: 07/30/24 9298  Chief Complaint(s) Urinary Retention  HPI Angel Pope is a 48 y.o. male history of obesity, hypertension, ureteral stricture status post suprapubic catheter recently presenting with abnormal urine color.  Patient reports that he noticed this morning that his urine was very dark, saw a few clots.  Denies any abdominal pain, fevers, chills, nausea, vomiting   Past Medical History Past Medical History:  Diagnosis Date   Anal fissure    Anxiety    BPH (benign prostatic hyperplasia)    Depression    sees Dr. Elna Lo    Diabetes mellitus without complication (HCC)    Fever of unknown origin    seeing Dr. Chyrl Fenton    Hypertension    Morbid obesity (HCC)    Nausea    chronic- Dr Avram   Overactive bladder    Overactive bladder    Pain, low back    Plantar fasciitis    Sleep apnea    uses cpap   Ureterolithiasis    Patient Active Problem List   Diagnosis Date Noted   Uncomplicated opioid dependence (HCC) 06/10/2024   Lithium  toxicity 06/09/2024   AKI (acute kidney injury) 06/09/2024   Chronic pain 06/09/2024   Type 2 diabetes mellitus with other specified complication (HCC) 08/16/2018   Low back pain 08/14/2018   Morbid obesity (HCC) 03/26/2012   CHRONIC PROSTATITIS 01/08/2011   FEVER UNSPECIFIED 01/08/2011   ABDOMINAL PAIN RIGHT UPPER QUADRANT 05/15/2010   DIARRHEA-PRESUMED INFECTIOUS 07/31/2009   ANAL FISSURE 07/31/2009   HEMATEMESIS 07/31/2009   NAUSEA AND VOMITING 07/31/2009   VOMITING 07/31/2009   Dysphagia 07/31/2009   DYSPHAGIA 07/31/2009   GERD 07/25/2009   Headache 07/19/2009   Thrombosed hemorrhoids 05/15/2009   OVERACTIVE BLADDER 12/15/2007   Anxiety state 08/03/2007   DEPRESSION 08/03/2007   LOW BACK PAIN 08/03/2007   Sleep apnea 08/03/2007   Home Medication(s) Prior to Admission medications   Medication Sig  Start Date End Date Taking? Authorizing Provider  nitrofurantoin , macrocrystal-monohydrate, (MACROBID ) 100 MG capsule Take 1 capsule (100 mg total) by mouth 2 (two) times daily. 07/30/24  Yes Francesca Elsie CROME, MD  atorvastatin  (LIPITOR) 20 MG tablet Take 1 tablet by mouth daily. 06/14/19   [provider]  betamethasone dipropionate 0.05 % cream Apply 1 application topically daily as needed (itching).  02/24/20   [provider]  chlorhexidine (PERIDEX) 0.12 % solution Use as directed 15 mLs in the mouth or throat daily.  03/28/20   [provider]  clopidogrel  (PLAVIX ) 75 MG tablet Take 1 tablet by mouth daily. 06/23/23   [provider]  clotrimazole-betamethasone (LOTRISONE) cream Apply 1 application topically daily as needed (itching).  02/23/20   [provider]  Continuous Glucose Sensor (DEXCOM G6 SENSOR) MISC DISPENSE AND USE AS DIRECTED 07/06/19   [provider]  ESZOPICLONE 3 MG tablet Take 3 mg by mouth at bedtime as needed (sleep). 03/23/20   [provider]  famotidine  (PEPCID ) 20 MG tablet Take 1 tablet (20 mg total) by mouth 2 (two) times daily. Patient taking differently: Take 20 mg by mouth 2 (two) times daily as needed for heartburn or indigestion. 04/13/20   Desiderio Chew, PA-C  FARXIGA 10 MG TABS tablet Take 10 mg by mouth every evening.  08/09/18   [provider]  gabapentin  (NEURONTIN ) 300 MG capsule Take 600 mg by mouth 3 (three) times daily.  10/06/17   [provider]  ibuprofen  (ADVIL ) 200 MG tablet Take 600 mg by mouth every 6 (six) hours as needed for mild pain (pain score 1-3).    [provider]  insulin  aspart protamine- aspart (NOVOLOG  MIX 70/30) (70-30) 100 UNIT/ML injection Inject 16 Units into the skin in the morning, at noon, and at bedtime. Sliding Scale    [provider]  lamoTRIgine  (LAMICTAL ) 150 MG tablet Take 1 tablet (150 mg total) by mouth 2 (two) times daily.  06/13/24   Evonnie Lenis, MD  metFORMIN (GLUCOPHAGE-XR) 500 MG 24 hr tablet Take 500 mg by mouth 2 (two) times daily. 03/27/20   [provider]  nebivolol (BYSTOLIC) 2.5 MG tablet Take 2.5 mg by mouth daily.    [provider]  OLANZapine  (ZYPREXA ) 10 MG tablet Take 10 mg by mouth at bedtime as needed (sleep).    [provider]  ondansetron  (ZOFRAN ) 4 MG tablet Take 4 mg by mouth every 8 (eight) hours as needed for vomiting. 03/23/20   [provider]  oxyCODONE  (ROXICODONE ) 15 MG immediate release tablet Take 15 mg by mouth every 6 (six) hours as needed for pain.    [provider]  pantoprazole  (PROTONIX ) 20 MG tablet Take 20 mg by mouth daily as needed for heartburn or indigestion. 02/09/24   [provider]  TRESIBA  FLEXTOUCH 200 UNIT/ML SOPN Inject 100 Units into the skin every evening.  10/03/17   [provider]  vitamin B-12 (VITAMIN B12) 500 MCG tablet Take 1 tablet (500 mcg total) by mouth daily. 06/14/24   Evonnie Lenis, MD  WEGOVY 2.4 MG/0.75ML SOAJ SQ injection Inject 0.75 mLs into the skin every Friday. 05/10/24   [provider]                                                                                                                                    Past Surgical History Past Surgical History:  Procedure Laterality Date   ANAL SPHINCTEROTOMY     dr cornett 06/04/09   APPENDECTOMY     CERVICAL FUSION  2017   ESOPHAGOGASTRODUODENOSCOPY     dr avram - normal   lab band  06/06/2008   dr mikell   Family History Family History  Problem Relation Age of Onset   Cancer Father        colon   Depression Other    Hypertension Other    Diabetes Other    COPD Other        adrenal tumors   Cystic fibrosis Other        paternall cousins   Inflammatory bowel disease Other        paternall aunt    Social History Social History   Tobacco Use   Smoking status: Never   Smokeless tobacco: Never  Vaping Use    Vaping status: Never Used  Substance Use Topics  Alcohol use: No   Drug use: No   Allergies Acetaminophen , Aspartame, Ciprofloxacin, Sulfa antibiotics, Etodolac, Metronidazole, Phenylalanine, and Sulfamethoxazole-trimethoprim  Review of Systems Review of Systems  All other systems reviewed and are negative.   Physical Exam Vital Signs  I have reviewed the triage vital signs BP (!) 142/70 (BP Location: Right Arm)   Pulse 85   Temp 98 F (36.7 C) (Oral)   Resp 18   Ht 6' 4 (1.93 m)   Wt (!) 204.6 kg   SpO2 96%   BMI 54.90 kg/m  Physical Exam Vitals and nursing note reviewed.  Constitutional:      General: He is not in acute distress.    Appearance: Normal appearance. He is obese.  HENT:     Head: Normocephalic and atraumatic.     Mouth/Throat:     Mouth: Mucous membranes are moist.  Eyes:     Conjunctiva/sclera: Conjunctivae normal.  Cardiovascular:     Rate and Rhythm: Normal rate.  Pulmonary:     Effort: Pulmonary effort is normal. No respiratory distress.  Abdominal:     General: Abdomen is flat.     Tenderness: There is no abdominal tenderness. There is no right CVA tenderness or left CVA tenderness.     Comments: Suprapubic catheter in place, no leaking  Skin:    General: Skin is warm and dry.     Capillary Refill: Capillary refill takes less than 2 seconds.  Neurological:     General: No focal deficit present.     Mental Status: He is alert. Mental status is at baseline.  Psychiatric:        Mood and Affect: Mood normal.        Behavior: Behavior normal.     ED Results and Treatments Labs (all labs ordered are listed, but only abnormal results are displayed) Labs Reviewed  URINALYSIS, ROUTINE W REFLEX MICROSCOPIC - Abnormal; Notable for the following components:      Result Value   APPearance HAZY (*)    Specific Gravity, Urine 1.040 (*)    Glucose, UA >=500 (*)    Hgb urine dipstick LARGE (*)    Ketones, ur 5 (*)    Protein, ur 30 (*)     Nitrite POSITIVE (*)    All other components within normal limits  URINE CULTURE                                                                                                                          Radiology No results found.  Pertinent labs & imaging results that were available during my care of the patient were reviewed by me and considered in my medical decision making (see MDM for details).  Medications Ordered in ED Medications - No data to display  Procedures Procedures  (including critical care time)  Medical Decision Making / ED Course   MDM:  48 year old presenting to the emergency department with urine color change.   Patient has suprapubic catheter with dark urine.  Looks somewhat like hematuria or few blood clots.  Will check UA.  Denies any fevers or chills, abdominal pain to suggest other process such as UTI.  Last urine culture grew Enterococcus if UA is concerning for UTI cover with Macrobid  and recommend urology follow-up.  Clinical Course as of 07/30/24 1019  Fri Jul 30, 2024  1018 UA with hematuria, also significant WBC and +nitrites, so will cover with macrobid , culture sent. Recommend urology follow up. Will discharge patient to home. All questions answered. Patient comfortable with plan of discharge. Return precautions discussed with patient and specified on the after visit summary.  [WS]    Clinical Course User Index [WS] Francesca Elsie CROME, MD     Additional history obtained:  -External records from outside source obtained and reviewed including: Chart review including previous notes, labs, imaging, consultation notes including urology notes    Lab Tests: -I ordered, reviewed, and interpreted labs.   The pertinent results include:   Labs Reviewed  URINALYSIS, ROUTINE W REFLEX MICROSCOPIC - Abnormal; Notable for the  following components:      Result Value   APPearance HAZY (*)    Specific Gravity, Urine 1.040 (*)    Glucose, UA >=500 (*)    Hgb urine dipstick LARGE (*)    Ketones, ur 5 (*)    Protein, ur 30 (*)    Nitrite POSITIVE (*)    All other components within normal limits  URINE CULTURE    Notable for +nitrites   Medicines ordered and prescription drug management: Meds ordered this encounter  Medications   nitrofurantoin , macrocrystal-monohydrate, (MACROBID ) 100 MG capsule    Sig: Take 1 capsule (100 mg total) by mouth 2 (two) times daily.    Dispense:  10 capsule    Refill:  0    -I have reviewed the patients home medicines and have made adjustments as needed  Social Determinants of Health:  Diagnosis or treatment significantly limited by social determinants of health: obesity  Co morbidities that complicate the patient evaluation  Past Medical History:  Diagnosis Date   Anal fissure    Anxiety    BPH (benign prostatic hyperplasia)    Depression    sees Dr. Elna Lo    Diabetes mellitus without complication (HCC)    Fever of unknown origin    seeing Dr. Chyrl Fenton    Hypertension    Morbid obesity (HCC)    Nausea    chronic- Dr Avram   Overactive bladder    Overactive bladder    Pain, low back    Plantar fasciitis    Sleep apnea    uses cpap   Ureterolithiasis       Dispostion: Disposition decision including need for hospitalization was considered, and patient discharged from emergency department.    Final Clinical Impression(s) / ED Diagnoses Final diagnoses:  Hematuria, unspecified type     This chart was dictated using voice recognition software.  Despite best efforts to proofread,  errors can occur which can change the documentation meaning.    Francesca Elsie CROME, MD 07/30/24 1019

## 2024-08-01 LAB — URINE CULTURE: Culture: >=100000 — AB

## 2024-08-02 ENCOUNTER — Telehealth (HOSPITAL_BASED_OUTPATIENT_CLINIC_OR_DEPARTMENT_OTHER): Payer: Self-pay | Admitting: *Deleted

## 2024-08-02 NOTE — Telephone Encounter (Signed)
 Post ED Visit - Positive Culture Follow-up  Culture report reviewed by antimicrobial stewardship pharmacist: [facility] [x]  Sharyne Glatter, Pharm.D. []  Venetia Gully, Pharm.D., BCPS AQ-ID []  Garrel Crews, Pharm.D., BCPS []  Almarie Lunger, Pharm.D., BCPS []  [place], [place].D., BCPS, AAHIVP []  Rosaline Bihari, Pharm.D., BCPS, AAHIVP []  Vernell Meier, PharmD, BCPS []  Latanya Hint, PharmD, BCPS []  Donald Medley, PharmD, BCPS []  Rocky Bold, PharmD []  Dorothyann Alert, PharmD, BCPS []  Morene Babe, PharmD  [facility] []  Rosaline Edison, PharmD []  Romona Bliss, PharmD []  Dolphus Roller, PharmD []  Veva Seip, Rph []  Vernell Daunt) Leonce, PharmD []  Eva Allis, PharmD []  Rosaline Millet, PharmD []  Iantha Batch, PharmD []  Arvin Gauss, PharmD []  Wanda Hasting, PharmD []  Ronal Rav, PharmD []  Rocky Slade, PharmD []  Bard Jeans, PharmD   Positive urine culture Treated with nitrofurantoin .  Symptom check conducted; pt denies c/o new sx and states previous sx have improved.  Advised pt to return to ED if he experiences fever, chills, or abdominal pain.  Pt voiced understanding.  No further patient follow-up is required at this time.  Lorita Barnie Pereyra 08/02/2024, 10:37 AM

## 2024-08-07 ENCOUNTER — Emergency Department (HOSPITAL_COMMUNITY)
Admission: EM | Admit: 2024-08-07 | Discharge: 2024-08-07 | Disposition: A | Discharge status: Home / Self Care | Attending: Emergency Medicine | Admitting: Emergency Medicine

## 2024-08-07 ENCOUNTER — Encounter (HOSPITAL_COMMUNITY): Payer: Self-pay

## 2024-08-07 ENCOUNTER — Other Ambulatory Visit: Payer: Self-pay

## 2024-08-07 DIAGNOSIS — Z9359 Other cystostomy status: Secondary | ICD-10-CM

## 2024-08-07 DIAGNOSIS — Y732 Prosthetic and other implants, materials and accessory gastroenterology and urology devices associated with adverse incidents: Secondary | ICD-10-CM | POA: Insufficient documentation

## 2024-08-07 DIAGNOSIS — Z794 Long term (current) use of insulin: Secondary | ICD-10-CM | POA: Diagnosis not present

## 2024-08-07 DIAGNOSIS — T83098A Other mechanical complication of other indwelling urethral catheter, initial encounter: Secondary | ICD-10-CM | POA: Diagnosis present

## 2024-08-07 LAB — URINALYSIS, W/ REFLEX TO CULTURE (INFECTION SUSPECTED)
Bacteria, UA: NONE SEEN
Bilirubin Urine: NEGATIVE
Glucose, UA: >=500 mg/dL — AB
Hgb urine dipstick: NEGATIVE
Ketones, ur: 5 mg/dL — AB
Leukocytes,Ua: NEGATIVE
Nitrite: NEGATIVE
Protein, ur: NEGATIVE mg/dL
Specific Gravity, Urine: 1.023 (ref 1.005–1.030)
pH: 8 (ref 5.0–8.0)

## 2024-08-07 NOTE — ED Provider Notes (Signed)
 [place] EMERGENCY DEPARTMENT AT [facility] Provider Note   CSN: [id number] Arrival date & time: 08/07/24  9171     Patient presents with: foley catheter leak   Angel Pope is a 48 y.o. male.   HPI 48 year old male presents with concern for a leaking suprapubic catheter.  Patient states he woke up this morning and was covered in urine.  He has had a suprapubic catheter for a while.  He states that he is currently battling a UTI and was given fosfomycin 3 days ago by his urologist over the phone.  He has no acute symptoms except that the catheter seems to have somehow leaked on him today.  He states since then he has made urine and it has gone in the bag.  He denies any abdominal pain, fever, or other concerning symptoms.  Prior to Admission medications   Medication Sig Start Date End Date Taking? Authorizing Provider  atorvastatin  (LIPITOR) 20 MG tablet Take 1 tablet by mouth daily. 06/14/19   [provider]  betamethasone dipropionate 0.05 % cream Apply 1 application topically daily as needed (itching).  02/24/20   [provider]  chlorhexidine (PERIDEX) 0.12 % solution Use as directed 15 mLs in the mouth or throat daily.  03/28/20   [provider]  clopidogrel  (PLAVIX ) 75 MG tablet Take 1 tablet by mouth daily. 06/23/23   [provider]  clotrimazole-betamethasone (LOTRISONE) cream Apply 1 application topically daily as needed (itching).  02/23/20   [provider]  Continuous Glucose Sensor (DEXCOM G6 SENSOR) MISC DISPENSE AND USE AS DIRECTED 07/06/19   [provider]  ESZOPICLONE 3 MG tablet Take 3 mg by mouth at bedtime as needed (sleep). 03/23/20   [provider]  famotidine  (PEPCID ) 20 MG tablet Take 1 tablet (20 mg total) by mouth 2 (two) times daily. Patient taking differently: Take 20 mg by mouth 2 (two) times daily as needed for heartburn or indigestion. 04/13/20   Desiderio Chew, PA-C  FARXIGA 10  MG TABS tablet Take 10 mg by mouth every evening.  08/09/18   [provider]  gabapentin  (NEURONTIN ) 300 MG capsule Take 600 mg by mouth 3 (three) times daily. 10/06/17   [provider]  ibuprofen  (ADVIL ) 200 MG tablet Take 600 mg by mouth every 6 (six) hours as needed for mild pain (pain score 1-3).    [provider]  insulin  aspart protamine- aspart (NOVOLOG  MIX 70/30) (70-30) 100 UNIT/ML injection Inject 16 Units into the skin in the morning, at noon, and at bedtime. Sliding Scale    [provider]  lamoTRIgine  (LAMICTAL ) 150 MG tablet Take 1 tablet (150 mg total) by mouth 2 (two) times daily. 06/13/24   Evonnie Lenis, MD  metFORMIN (GLUCOPHAGE-XR) 500 MG 24 hr tablet Take 500 mg by mouth 2 (two) times daily. 03/27/20   [provider]  nebivolol (BYSTOLIC) 2.5 MG tablet Take 2.5 mg by mouth daily.    [provider]  nitrofurantoin , macrocrystal-monohydrate, (MACROBID ) 100 MG capsule Take 1 capsule (100 mg total) by mouth 2 (two) times daily. 07/30/24   Francesca Elsie CROME, MD  OLANZapine  (ZYPREXA ) 10 MG tablet Take 10 mg by mouth at bedtime as needed (sleep).    [provider]  ondansetron  (ZOFRAN ) 4 MG tablet Take 4 mg by mouth every 8 (eight) hours as needed for vomiting. 03/23/20   [provider]  oxyCODONE  (ROXICODONE ) 15 MG immediate release tablet Take 15 mg by mouth every  6 (six) hours as needed for pain.    [provider]  pantoprazole  (PROTONIX ) 20 MG tablet Take 20 mg by mouth daily as needed for heartburn or indigestion. 02/09/24   [provider]  TRESIBA  FLEXTOUCH 200 UNIT/ML SOPN Inject 100 Units into the skin every evening.  10/03/17   [provider]  vitamin B-12 (VITAMIN B12) 500 MCG tablet Take 1 tablet (500 mcg total) by mouth daily. 06/14/24   Evonnie Lenis, MD  WEGOVY 2.4 MG/0.75ML SOAJ SQ injection Inject 0.75 mLs into the skin every Friday. 05/10/24   [provider]     Allergies: Acetaminophen , Aspartame, Ciprofloxacin, Sulfa antibiotics, Etodolac, Metronidazole, Phenylalanine, and Sulfamethoxazole-trimethoprim    Review of Systems  Constitutional:  Negative for fever.  Gastrointestinal:  Negative for abdominal pain.    Updated Vital Signs BP (!) 163/86 (BP Location: Right Wrist)   Pulse 91   Temp 98.1 F (36.7 C) (Oral)   Resp 16   Ht 6' 4 (1.93 m)   Wt (!) 204.6 kg   SpO2 92%   BMI 54.90 kg/m   Physical Exam Vitals and nursing note reviewed.  Constitutional:      Appearance: He is well-developed. He is obese.  HENT:     Head: Normocephalic and atraumatic.  Pulmonary:     Effort: Pulmonary effort is normal.  Abdominal:     Palpations: Abdomen is soft.     Tenderness: There is no abdominal tenderness.     Comments: Suprapubic catheter in place.  There is some mild skin irritation surrounding this.  No fluctuance.  Skin:    General: Skin is warm and dry.  Neurological:     Mental Status: He is alert.     (all labs ordered are listed, but only abnormal results are displayed) Labs Reviewed  URINALYSIS, W/ REFLEX TO CULTURE (INFECTION SUSPECTED) - Abnormal; Notable for the following components:      Result Value   Color, Urine STRAW (*)    Glucose, UA >=500 (*)    Ketones, ur 5 (*)    All other components within normal limits    EKG: None  Radiology: No results found.   Procedures   Medications Ordered in the ED - No data to display                                  Medical Decision Making Amount and/or Complexity of Data Reviewed External Data Reviewed: notes. Labs: ordered.    Details: No UTI   Suprapubic catheter seems to be working.  Nurse reports that the tubing seem to be a little kinked and she helped unkinked that.  Might have been the cause for some overflow of urine.  It otherwise seems to be working fine.  There is some skin irritation and unfortunately it appears to be covered by pannus and so I  have recommended trying to keep the area dry and can consider some baby powder to see if this will help.  Otherwise appears stable for discharge with return precautions and urology follow-up.  Urine is negative.     Final diagnoses:  Chronic suprapubic catheter [facility])    ED Discharge Orders     None          Freddi Hamilton, MD 08/07/24 1018

## 2024-08-07 NOTE — ED Triage Notes (Addendum)
 Pt c/o catheter problem. Pt states he woke up and the bed was soaked states unsure if the foley is clogged or too small. Pt denies pain at this time. Pt also informs staff that he was recently diagnosed with UTI and still feels symptoms.

## 2024-08-07 NOTE — Discharge Instructions (Signed)
 Try to keep the insertion site of your suprapubic catheter clean and dry.  You can use baby powder to help with this.  Follow-up with your urologist.  Return to the ER for any new or worsening concerns.

## 2024-08-07 NOTE — ED Notes (Addendum)
 Foley cath is flowing correctly. 200 ml of urine in the bag at this time.No urine leakage around suprapubic.

## 2024-12-14 ENCOUNTER — Ambulatory Visit: Admitting: Physician Assistant
